# Patient Record
Sex: Female | Born: 1975
Health system: Southern US, Community
[De-identification: ages and names within clinical notes are randomized; demographics above are authoritative.]

## PROBLEM LIST (undated history)

## (undated) DIAGNOSIS — Z5189 Encounter for other specified aftercare: Secondary | ICD-10-CM

## (undated) DIAGNOSIS — K760 Fatty (change of) liver, not elsewhere classified: Secondary | ICD-10-CM

## (undated) DIAGNOSIS — M1990 Unspecified osteoarthritis, unspecified site: Secondary | ICD-10-CM

## (undated) DIAGNOSIS — E0500 Thyrotoxicosis with diffuse goiter without thyrotoxic crisis or storm: Secondary | ICD-10-CM

## (undated) DIAGNOSIS — D509 Iron deficiency anemia, unspecified: Principal | ICD-10-CM

## (undated) DIAGNOSIS — Z8619 Personal history of other infectious and parasitic diseases: Secondary | ICD-10-CM

## (undated) DIAGNOSIS — F329 Major depressive disorder, single episode, unspecified: Secondary | ICD-10-CM

## (undated) DIAGNOSIS — K219 Gastro-esophageal reflux disease without esophagitis: Secondary | ICD-10-CM

## (undated) DIAGNOSIS — I10 Essential (primary) hypertension: Secondary | ICD-10-CM

## (undated) DIAGNOSIS — I4891 Unspecified atrial fibrillation: Secondary | ICD-10-CM

## (undated) DIAGNOSIS — R011 Cardiac murmur, unspecified: Secondary | ICD-10-CM

## (undated) DIAGNOSIS — B009 Herpesviral infection, unspecified: Secondary | ICD-10-CM

## (undated) HISTORY — DX: Fatty (change of) liver, not elsewhere classified: K76.0

## (undated) HISTORY — DX: Essential (primary) hypertension: I10

## (undated) HISTORY — PX: TUBAL LIGATION: SHX77

## (undated) HISTORY — DX: Major depressive disorder, single episode, unspecified: F32.9

## (undated) HISTORY — DX: Encounter for other specified aftercare: Z51.89

## (undated) HISTORY — DX: Thyrotoxicosis with diffuse goiter without thyrotoxic crisis or storm: E05.00

## (undated) HISTORY — DX: Herpesviral infection, unspecified: B00.9

## (undated) HISTORY — DX: Gastro-esophageal reflux disease without esophagitis: K21.9

## (undated) HISTORY — DX: Unspecified atrial fibrillation: I48.91

## (undated) HISTORY — DX: Iron deficiency anemia, unspecified: D50.9

## (undated) HISTORY — DX: Personal history of other infectious and parasitic diseases: Z86.19

## (undated) HISTORY — DX: Cardiac murmur, unspecified: R01.1

---

## 1998-06-03 ENCOUNTER — Emergency Department (HOSPITAL_COMMUNITY): Admission: EM | Admit: 1998-06-03 | Discharge: 1998-06-03 | Payer: Self-pay | Admitting: Emergency Medicine

## 1998-10-17 ENCOUNTER — Inpatient Hospital Stay (HOSPITAL_COMMUNITY): Admission: AD | Admit: 1998-10-17 | Discharge: 1998-10-17 | Payer: Self-pay | Admitting: Obstetrics

## 1998-10-20 ENCOUNTER — Inpatient Hospital Stay (HOSPITAL_COMMUNITY): Admission: AD | Admit: 1998-10-20 | Discharge: 1998-10-20 | Payer: Self-pay | Admitting: Obstetrics

## 1998-11-21 ENCOUNTER — Inpatient Hospital Stay (HOSPITAL_COMMUNITY): Admission: AD | Admit: 1998-11-21 | Discharge: 1998-11-21 | Payer: Self-pay | Admitting: Obstetrics

## 1999-02-19 ENCOUNTER — Encounter (HOSPITAL_COMMUNITY): Admission: RE | Admit: 1999-02-19 | Discharge: 1999-02-24 | Payer: Self-pay | Admitting: Obstetrics

## 1999-02-20 ENCOUNTER — Inpatient Hospital Stay (HOSPITAL_COMMUNITY): Admission: AD | Admit: 1999-02-20 | Discharge: 1999-02-20 | Payer: Self-pay | Admitting: Obstetrics

## 1999-02-21 ENCOUNTER — Inpatient Hospital Stay (HOSPITAL_COMMUNITY): Admission: AD | Admit: 1999-02-21 | Discharge: 1999-02-24 | Payer: Self-pay | Admitting: Obstetrics

## 1999-02-21 ENCOUNTER — Encounter (INDEPENDENT_AMBULATORY_CARE_PROVIDER_SITE_OTHER): Payer: Self-pay

## 1999-06-10 ENCOUNTER — Emergency Department (HOSPITAL_COMMUNITY): Admission: EM | Admit: 1999-06-10 | Discharge: 1999-06-10 | Payer: Self-pay | Admitting: Emergency Medicine

## 1999-06-10 ENCOUNTER — Encounter: Payer: Self-pay | Admitting: Emergency Medicine

## 1999-11-30 ENCOUNTER — Emergency Department (HOSPITAL_COMMUNITY): Admission: EM | Admit: 1999-11-30 | Discharge: 1999-11-30 | Payer: Self-pay | Admitting: Emergency Medicine

## 2000-01-12 DIAGNOSIS — E0500 Thyrotoxicosis with diffuse goiter without thyrotoxic crisis or storm: Secondary | ICD-10-CM

## 2000-01-12 HISTORY — DX: Thyrotoxicosis with diffuse goiter without thyrotoxic crisis or storm: E05.00

## 2001-01-11 HISTORY — PX: THYROIDECTOMY: SHX17

## 2001-02-13 ENCOUNTER — Encounter: Admission: RE | Admit: 2001-02-13 | Discharge: 2001-02-13 | Payer: Self-pay

## 2001-02-15 ENCOUNTER — Encounter: Admission: RE | Admit: 2001-02-15 | Discharge: 2001-02-15 | Payer: Self-pay | Admitting: *Deleted

## 2001-03-03 ENCOUNTER — Encounter: Admission: RE | Admit: 2001-03-03 | Discharge: 2001-03-03 | Payer: Self-pay | Admitting: Internal Medicine

## 2001-03-09 ENCOUNTER — Encounter: Admission: RE | Admit: 2001-03-09 | Discharge: 2001-03-09 | Payer: Self-pay | Admitting: *Deleted

## 2001-03-09 ENCOUNTER — Ambulatory Visit (HOSPITAL_COMMUNITY): Admission: RE | Admit: 2001-03-09 | Discharge: 2001-03-09 | Payer: Self-pay | Admitting: *Deleted

## 2001-03-10 ENCOUNTER — Encounter: Admission: RE | Admit: 2001-03-10 | Discharge: 2001-03-10 | Payer: Self-pay | Admitting: *Deleted

## 2001-03-16 ENCOUNTER — Encounter: Admission: RE | Admit: 2001-03-16 | Discharge: 2001-03-16 | Payer: Self-pay | Admitting: *Deleted

## 2001-03-16 ENCOUNTER — Ambulatory Visit (HOSPITAL_COMMUNITY): Admission: RE | Admit: 2001-03-16 | Discharge: 2001-03-16 | Payer: Self-pay | Admitting: *Deleted

## 2001-03-23 ENCOUNTER — Ambulatory Visit (HOSPITAL_COMMUNITY): Admission: RE | Admit: 2001-03-23 | Discharge: 2001-03-23 | Payer: Self-pay | Admitting: *Deleted

## 2001-03-30 ENCOUNTER — Encounter: Admission: RE | Admit: 2001-03-30 | Discharge: 2001-03-30 | Payer: Self-pay | Admitting: *Deleted

## 2001-04-05 ENCOUNTER — Encounter: Admission: RE | Admit: 2001-04-05 | Discharge: 2001-04-05 | Payer: Self-pay | Admitting: *Deleted

## 2001-04-12 ENCOUNTER — Encounter: Admission: RE | Admit: 2001-04-12 | Discharge: 2001-04-12 | Payer: Self-pay | Admitting: *Deleted

## 2001-04-19 ENCOUNTER — Encounter: Admission: RE | Admit: 2001-04-19 | Discharge: 2001-04-19 | Payer: Self-pay

## 2001-05-03 ENCOUNTER — Encounter: Admission: RE | Admit: 2001-05-03 | Discharge: 2001-05-03 | Payer: Self-pay | Admitting: *Deleted

## 2001-05-25 ENCOUNTER — Encounter: Admission: RE | Admit: 2001-05-25 | Discharge: 2001-05-25 | Payer: Self-pay | Admitting: *Deleted

## 2001-06-07 ENCOUNTER — Encounter: Admission: RE | Admit: 2001-06-07 | Discharge: 2001-06-07 | Payer: Self-pay | Admitting: *Deleted

## 2001-06-14 ENCOUNTER — Ambulatory Visit (HOSPITAL_COMMUNITY): Admission: RE | Admit: 2001-06-14 | Discharge: 2001-06-14 | Payer: Self-pay | Admitting: *Deleted

## 2001-06-14 ENCOUNTER — Encounter: Payer: Self-pay | Admitting: *Deleted

## 2001-06-19 ENCOUNTER — Encounter (HOSPITAL_COMMUNITY): Admission: AD | Admit: 2001-06-19 | Discharge: 2001-07-19 | Payer: Self-pay | Admitting: *Deleted

## 2001-06-21 ENCOUNTER — Encounter: Admission: RE | Admit: 2001-06-21 | Discharge: 2001-06-21 | Payer: Self-pay | Admitting: *Deleted

## 2001-06-29 ENCOUNTER — Encounter: Admission: RE | Admit: 2001-06-29 | Discharge: 2001-06-29 | Payer: Self-pay | Admitting: *Deleted

## 2001-07-06 ENCOUNTER — Encounter: Admission: RE | Admit: 2001-07-06 | Discharge: 2001-07-06 | Payer: Self-pay | Admitting: *Deleted

## 2001-07-13 ENCOUNTER — Encounter: Admission: RE | Admit: 2001-07-13 | Discharge: 2001-07-13 | Payer: Self-pay | Admitting: *Deleted

## 2001-07-19 ENCOUNTER — Encounter: Admission: RE | Admit: 2001-07-19 | Discharge: 2001-07-19 | Payer: Self-pay | Admitting: *Deleted

## 2001-07-24 ENCOUNTER — Encounter (HOSPITAL_COMMUNITY): Admission: RE | Admit: 2001-07-24 | Discharge: 2001-08-16 | Payer: Self-pay | Admitting: Obstetrics and Gynecology

## 2001-07-26 ENCOUNTER — Encounter: Admission: RE | Admit: 2001-07-26 | Discharge: 2001-07-26 | Payer: Self-pay | Admitting: *Deleted

## 2001-07-31 ENCOUNTER — Ambulatory Visit (HOSPITAL_COMMUNITY): Admission: RE | Admit: 2001-07-31 | Discharge: 2001-07-31 | Payer: Self-pay | Admitting: *Deleted

## 2001-07-31 ENCOUNTER — Encounter: Payer: Self-pay | Admitting: *Deleted

## 2001-08-02 ENCOUNTER — Encounter: Admission: RE | Admit: 2001-08-02 | Discharge: 2001-08-02 | Payer: Self-pay | Admitting: *Deleted

## 2001-08-09 ENCOUNTER — Encounter: Admission: RE | Admit: 2001-08-09 | Discharge: 2001-08-09 | Payer: Self-pay | Admitting: *Deleted

## 2001-08-16 ENCOUNTER — Encounter: Admission: RE | Admit: 2001-08-16 | Discharge: 2001-08-16 | Payer: Self-pay | Admitting: *Deleted

## 2001-08-17 ENCOUNTER — Inpatient Hospital Stay (HOSPITAL_COMMUNITY): Admission: AD | Admit: 2001-08-17 | Discharge: 2001-08-20 | Payer: Self-pay | Admitting: *Deleted

## 2001-08-17 ENCOUNTER — Encounter (INDEPENDENT_AMBULATORY_CARE_PROVIDER_SITE_OTHER): Payer: Self-pay | Admitting: Specialist

## 2001-08-22 ENCOUNTER — Inpatient Hospital Stay (HOSPITAL_COMMUNITY): Admission: AD | Admit: 2001-08-22 | Discharge: 2001-08-22 | Payer: Self-pay | Admitting: *Deleted

## 2001-08-24 ENCOUNTER — Inpatient Hospital Stay (HOSPITAL_COMMUNITY): Admission: AD | Admit: 2001-08-24 | Discharge: 2001-08-24 | Payer: Self-pay | Admitting: *Deleted

## 2001-11-09 ENCOUNTER — Ambulatory Visit (HOSPITAL_COMMUNITY): Admission: RE | Admit: 2001-11-09 | Discharge: 2001-11-10 | Payer: Self-pay | Admitting: Surgery

## 2001-11-09 ENCOUNTER — Encounter: Payer: Self-pay | Admitting: Surgery

## 2001-11-09 ENCOUNTER — Encounter (INDEPENDENT_AMBULATORY_CARE_PROVIDER_SITE_OTHER): Payer: Self-pay | Admitting: *Deleted

## 2003-11-14 ENCOUNTER — Emergency Department (HOSPITAL_COMMUNITY): Admission: EM | Admit: 2003-11-14 | Discharge: 2003-11-14 | Payer: Self-pay | Admitting: Family Medicine

## 2005-08-13 ENCOUNTER — Inpatient Hospital Stay (HOSPITAL_COMMUNITY): Admission: AD | Admit: 2005-08-13 | Discharge: 2005-08-13 | Payer: Self-pay | Admitting: Obstetrics and Gynecology

## 2005-09-30 ENCOUNTER — Other Ambulatory Visit: Admission: RE | Admit: 2005-09-30 | Discharge: 2005-09-30 | Payer: Self-pay | Admitting: Obstetrics and Gynecology

## 2005-11-08 ENCOUNTER — Emergency Department (HOSPITAL_COMMUNITY): Admission: EM | Admit: 2005-11-08 | Discharge: 2005-11-08 | Payer: Self-pay | Admitting: Emergency Medicine

## 2005-11-10 ENCOUNTER — Emergency Department (HOSPITAL_COMMUNITY): Admission: EM | Admit: 2005-11-10 | Discharge: 2005-11-10 | Payer: Self-pay | Admitting: Family Medicine

## 2005-12-21 ENCOUNTER — Encounter (INDEPENDENT_AMBULATORY_CARE_PROVIDER_SITE_OTHER): Payer: Self-pay | Admitting: *Deleted

## 2005-12-21 ENCOUNTER — Inpatient Hospital Stay (HOSPITAL_COMMUNITY): Admission: AD | Admit: 2005-12-21 | Discharge: 2005-12-27 | Payer: Self-pay | Admitting: Obstetrics and Gynecology

## 2005-12-28 ENCOUNTER — Encounter: Admission: RE | Admit: 2005-12-28 | Discharge: 2006-01-27 | Payer: Self-pay | Admitting: Obstetrics and Gynecology

## 2006-01-28 ENCOUNTER — Encounter: Admission: RE | Admit: 2006-01-28 | Discharge: 2006-02-27 | Payer: Self-pay | Admitting: Obstetrics and Gynecology

## 2006-02-28 ENCOUNTER — Encounter: Admission: RE | Admit: 2006-02-28 | Discharge: 2006-03-28 | Payer: Self-pay | Admitting: Obstetrics and Gynecology

## 2006-03-29 ENCOUNTER — Encounter: Admission: RE | Admit: 2006-03-29 | Discharge: 2006-04-28 | Payer: Self-pay | Admitting: Obstetrics and Gynecology

## 2006-04-29 ENCOUNTER — Encounter: Admission: RE | Admit: 2006-04-29 | Discharge: 2006-05-28 | Payer: Self-pay | Admitting: Obstetrics and Gynecology

## 2006-05-29 ENCOUNTER — Encounter: Admission: RE | Admit: 2006-05-29 | Discharge: 2006-06-14 | Payer: Self-pay | Admitting: Obstetrics and Gynecology

## 2006-11-14 ENCOUNTER — Ambulatory Visit (HOSPITAL_COMMUNITY): Admission: RE | Admit: 2006-11-14 | Discharge: 2006-11-14 | Payer: Self-pay | Admitting: Internal Medicine

## 2007-10-26 ENCOUNTER — Encounter: Admission: RE | Admit: 2007-10-26 | Discharge: 2007-10-26 | Payer: Self-pay | Admitting: Family Medicine

## 2008-04-19 DIAGNOSIS — E039 Hypothyroidism, unspecified: Secondary | ICD-10-CM | POA: Insufficient documentation

## 2008-04-19 DIAGNOSIS — I059 Rheumatic mitral valve disease, unspecified: Secondary | ICD-10-CM

## 2008-04-19 DIAGNOSIS — K560 Paralytic ileus: Secondary | ICD-10-CM | POA: Insufficient documentation

## 2008-04-19 DIAGNOSIS — K5900 Constipation, unspecified: Secondary | ICD-10-CM | POA: Insufficient documentation

## 2008-04-19 DIAGNOSIS — R12 Heartburn: Secondary | ICD-10-CM | POA: Insufficient documentation

## 2008-04-24 ENCOUNTER — Ambulatory Visit: Payer: Self-pay | Admitting: Internal Medicine

## 2008-04-24 DIAGNOSIS — R079 Chest pain, unspecified: Secondary | ICD-10-CM

## 2008-04-24 DIAGNOSIS — F329 Major depressive disorder, single episode, unspecified: Secondary | ICD-10-CM

## 2008-05-07 ENCOUNTER — Ambulatory Visit: Payer: Self-pay | Admitting: Internal Medicine

## 2008-05-07 ENCOUNTER — Encounter: Payer: Self-pay | Admitting: Internal Medicine

## 2008-05-10 ENCOUNTER — Encounter: Payer: Self-pay | Admitting: Internal Medicine

## 2009-06-11 LAB — HM PAP SMEAR: HM Pap smear: NORMAL

## 2010-01-11 DIAGNOSIS — F32A Depression, unspecified: Secondary | ICD-10-CM

## 2010-01-11 HISTORY — DX: Depression, unspecified: F32.A

## 2010-05-29 NOTE — H&P (Signed)
Cheyenne Hawkins, Cheyenne Hawkins             ACCOUNT NO.:  [id number]   MEDICAL RECORD NO.:  [id number]          PATIENT TYPE:  INP   LOCATION:  NA                            FACILITY:  [facility]   PHYSICIAN:  Janine Limbo, M.D.DATE OF BIRTH:  Jul 09, 1975   DATE OF ADMISSION:  12/21/2005  DATE OF DISCHARGE:                              HISTORY & PHYSICAL   HISTORY OF PRESENT ILLNESS:  Cheyenne Hawkins is a 35 year old female,  gravida 4, para 2-0-1-2, who presents at [redacted] weeks gestation (EDC is  December 29, 2005) for repeat cesarean section and tubal ligation.  The  patient has been followed at the [facility] and  Gynecology Division of [facility].  The patient's  pregnancy has been complicated by the fact that she has had two prior  cesarean sections.  She also has a history of hypothyroidism.  The  patient has done well during this pregnancy.   ALLERGIES:  NO KNOWN DRUG ALLERGIES.   OBSTETRICAL HISTORY:  1. In 1995, the patient had a first trimester elective pregnancy      termination.  2. In 2001, the patient had a cesarean section at term where she      delivered an 8 pound 2 ounce female infant.  3. In 2003, the patient had a repeat cesarean section at term where      she delivered a 7 pound 10 ounce female infant.  That pregnancy was      complicated by uterine rupture.   PAST MEDICAL HISTORY:  1. History of hypothyroidism and she currently takes Synthroid.  2. History of mitral valve prolapse.   SOCIAL HISTORY:  The patient is an [employer] fund[employer] and she is married.  She denies  cigarette use, alcohol use and recreational drug use during her  pregnancy.  She does drink wine when she is not pregnant.   REVIEW OF SYSTEMS:  Noncontributory.   FAMILY HISTORY:  Noncontributory.   PHYSICAL EXAMINATION:  VITAL SIGNS:  Weight 171 pounds.  HEENT:  Within normal limits.  CHEST:  Clear.  CARDIOVASCULAR:  Regular rate and rhythm.  BREASTS:  Without masses.  ABDOMEN:   Gravid with a fundal height of 36 cm.  EXTREMITIES:  Grossly normal.  NEUROLOGIC:  Grossly normal.  PELVIC:  Cervix was closed and long when last checked.   Blood type is O positive, antibody screen negative.  Sickle cell  negative.  Rubella positive.  HBSAG negative.  Third trimester Beta  Strep is negative.   ASSESSMENT:  1. At [redacted] weeks gestation.  2. Two prior cesarean sections.  3. Desires repeat cesarean section and tubal sterilization.  4. Hypothyroidism.  5. Mitral valve prolapse.   PLAN:  The patient will undergo repeat cesarean section and bilateral  tubal ligation.  The patient understands the indications for her  surgical procedure and she accepts the risk of, but not limited to  anesthetic complications, bleeding, infections, and possible damage to  the surrounding organs.  She also understands that there is a small but  real failure rate associated with tubal ligation (17/1000).  Janine Limbo, M.D.  Electronically Signed     AVS/MEDQ  D:  12/20/2005  T:  12/20/2005  Job:  [id number]

## 2010-05-29 NOTE — H&P (Signed)
[facility]  Patient:    Cheyenne Hawkins, Cheyenne Hawkins                        MRN: [id number] Adm. Date:  [id number] Attending:  Venita Sheffield                         History and Physical  HISTORY OF PRESENT ILLNESS:   The patient is a 35 year old, gravida 2, para 0, whose admitted in active labor.  The patients estimated date of confinement was  February 12, 1999.  At the time of admission, the patient did indicate that she ad had a past history of a partner with HSG, however, on evaluation of the pelvic genitalia and vaginal and cervical areas, no active lesions were present.  The patient was allowed to proceed in labor to which she proceeded in adequate labor for approximately six hours during which time there was no descent and the infant remained at a -2 to -3 station.  After membranes were ruptured, slight meconium  stained fluid was present, however, over the next three to four hours, no descent was apparent, and the cervical dilatation remained at approximately 6 cm.  The decision then was to proceed with a cesarean section.  PHYSICAL EXAMINATION:  GENERAL:                      Revealed a well-developed, well-nourished, gravid  female in labor-type distress.  HEENT:                        Within normal limits.  NECK:                         Supple.  BREASTS:                      Without masses, tenderness, or discharge.  LUNGS:                        Clear to auscultation and percussion.  HEART:                        Normal sinus rhythm without murmurs, rubs, or gallops.  ABDOMEN:                      Term gravid with fetal heart beats in the left lower quadrant.  EXTREMITIES: NEUROLOGICAL:    Within normal limits.  PELVIC:                       Revealed a cervix of 6 cm dilation at -2 station,  vertex presentation.  ADMISSION DIAGNOSES:          1. Intrauterine pregnancy.                               2. Failure to  progress.  PLAN:                         Primary cesarean section. DD:  02/21/99 TD:  02/23/99 Job: [id number]

## 2010-05-29 NOTE — Op Note (Signed)
Cheyenne Hawkins, [name]                           ACCOUNT NO.:  [id number]   MEDICAL RECORD NO.:  [id number]                   PATIENT TYPE:  INP   LOCATION:  9107                                 FACILITY:  [facility]   PHYSICIAN:  Enid Cutter, M.D.                  DATE OF BIRTH:  12-May-1975   DATE OF PROCEDURE:  08/17/2001  DATE OF DISCHARGE:                                 OPERATIVE REPORT   PREOPERATIVE DIAGNOSIS:  A 35 year old gravida 2, para 1, with attempted  vaginal birth after cesarean delivery, with nonreassuring fetal heart  tracing.   POSTOPERATIVE DIAGNOSES:  39. A 35 year old gravida 2, para 1, with attempted vaginal birth after     cesarean delivery, with nonreassuring fetal heart tracing.  2. Uterine rupture.   PROCEDURE:  Repeat low transverse cesarean section.   SURGEON:  Enid Cutter, M.D.   ASSISTANT:  Cheyenne Hawkins, C.N.M.   ANESTHESIA:  Spinal.   COMPLICATIONS:  None.   SPECIMENS:  Placenta.   ESTIMATED BLOOD LOSS:  800 cc.   DISPOSITION:  To recovery room stable.   INDICATIONS:  The patient is a 35 year old gravida 2, para 1, who presented  to labor and delivery for attempted VBAC.  She underwent artificial rupture  of membranes and spontaneous labor.  She progressed to approximately 4 cm.  I was called in emergently to the patient's room secondary to a  nonreassuring fetal heart tracing and because she was complaining of severe  lower abdominal pain and began having severe repetitive variable  decelerations.  The patient was counseled on risks of C-section, including  the risks of bleeding, infection, and injury to internal organs, and agreed  to proceed with cesarean.   DESCRIPTION OF PROCEDURE:  The patient was taken back to the operating room  and was given spinal anesthesia.  She was prepped and draped in a sterile  fashion.  A Pfannenstiel incision was performed with a scalpel and carried  down to the underlying fascia.  The fascia was then  incised sharply and  dissected laterally with Mayo scissors.  The fascia was separated from the  underlying rectus muscle bellies with sharp and blunt dissection.  The  rectus muscle bellies were separated and the peritoneum was entered sharply  with Metzenbaum scissors.  A bladder flap was created and the uterus was  scored with the scalpel.  The uterine incision was extended in the midline  with scalpel and the surgeon's fingers were used to extend the incision.  The infant's head was delivered and bulb-suctioned on the operative field.  The infant's body was delivered and the cord was clamped and cut.  The  infant was handed off to the awaiting neonatal resuscitation team.  Apgars  were 8 and 9.  Once the infant was delivered, the placenta was then manually  extracted and the uterus secured with a dry lap sponge  and then  externalized.  A survey of the anatomy revealed that there was a uterine  rupture and marked edema of the broad ligaments bilaterally.  The bladder  was overlying a very thin lower uterine segment.  The bladder was carefully  dissected away from the lower uterine segment and the uterine repair was  performed with a running locking stitch of 0 chromic.  A second imbricating  layer of sutures was performed and final hemostasis was obtained with  multiple figure-of-eight sutures.  The uterus was returned to the abdomen,  the paracolic gutters were emptied of clot and debris.  The bladder was then  infused with sterile milk to assure no bladder injury.  The bladder was  distended, and there was no leaking.  The fascia was then closed with a  running stitch of 0 Vicryl.  The subcutaneous tissues were irrigated with  copious amounts of saline and the skin was reapproximated with staples.  The  patient tolerated the procedure well and returned to the recovery room in  stable condition.  The sponge, instrument, and needle counts were correct at  the end of the  procedure.                                               Enid Cutter, M.D.    EMH/MEDQ  D:  08/17/2001  T:  08/19/2001  Job:  [id number]

## 2010-05-29 NOTE — Op Note (Signed)
[facility]  Patient:    Cheyenne Hawkins, Cheyenne Hawkins                        MRN: [id number] Proc. Date: 02/21/99 Adm. Date:  [id number] Attending:  Venita Sheffield                           Operative Report  PREOPERATIVE DIAGNOSIS:       Intrauterine pregnancy at term with failure to progress.  POSTOPERATIVE DIAGNOSIS:      Intrauterine pregnancy at term with failure to progress.  Plus a viable female infant with Apgars of 6 and 9.  OPERATION:                    Primary low transverse cesarean section.  SURGEON:                      Deniece Ree, M.D.  ASSISTANT:  ANESTHESIA:                   Epidural by Gretta Cool., M.D.  PEDIATRICS:                   Teaching Service.  ESTIMATED BLOOD LOSS:         350 cc.  DRAINS:                       Foley was left to straight drainage.  CONDITION:                    The patient tolerated the procedure well and returned to the recovery room in satisfactory condition.  DESCRIPTION OF PROCEDURE:     The patient was taken to the operating room and prepped and draped in the usual fashion for a cesarean section.  A low Pfannenstiel incision was made.  This was carried down to the fascia at which time the fascia was entered and excised the extent of the incision.  The midline was identified and rectus muscles separated.  The abdominal peritoneum was then entered in a vertical fashion using Metzenbaum scissors.  The visceroperitoneum was then excised bilaterally toward the round ligaments following which the lower uterine segment was scored, entered in the midline, and bluntly dissected open.  The right hand  introduced and the infants head was delivered.  There was a cord around the neck x 1 which was removed.  The infant was DeLeed following which suction bulb was utilized to suck infant out.  Complete delivery was then carried out without any problems.  The cord was clamped and the infant  turned over to the pediatricians who were in attendance.  Cord blood was then obtained following which the placenta s well as all products of conception were then manually removed from the uterine cavity.  IV Pitocin as well as IV antibiotics were then begun.  The myometrium as then closed using #1 chromic in a running locking stitch followed by an imbricating stitch again using #1 chromic.  Reperitonealization was then carried out using -0 chromic in a running stitch.  Sponge, needle, and instrument counts were correct x 2.  Hemostasis was present.  Abdominal peritoneum was then closed using 2-0 chromic in a running stitch followed by closure of the fascia using #1 Vicryl in a running stitch.  The skin was closed with skin staples.  The procedure was terminated. The patient tolerated the procedure well and returned to the recovery room in satisfactory condition. DD:  02/21/99 TD:  02/23/99 Job: [id number]

## 2010-05-29 NOTE — Discharge Summary (Signed)
Cheyenne Hawkins, Cheyenne Hawkins             ACCOUNT NO.:  [id number]   MEDICAL RECORD NO.:  [id number]          PATIENT TYPE:  INP   LOCATION:  9122                          FACILITY:  [facility]   PHYSICIAN:  Cheyenne Hawkins, M.D. DATE OF BIRTH:  [birth year]   DATE OF ADMISSION:  12/21/2005  DATE OF DISCHARGE:                               DISCHARGE SUMMARY   ADMITTING DIAGNOSES:  1. Intrauterine pregnancy at 39 weeks.  2. Previous cesarean section.  3. Desires repeat cesarean section and tubal sterilization.  4. Hypothyroidism.  5. Mitral valve prolapse.   DISCHARGE DIAGNOSES:  1. Intrauterine pregnancy at 39 weeks.  2. Previous cesarean section.  3. Desires repeat cesarean section and tubal sterilization.  4. Hypothyroidism.  5. Mitral valve prolapse.  6. Ileus.  7. Hypotensive reaction of uncertain etiology.  8. Anemia postoperatively.   PROCEDURES:  1. Repeat low transverse cesarean section.  2. Bilateral tubal sterilization.  3. Spinal anesthesia.  4. Blood transfusion.   HOSPITAL COURSE:  Cheyenne Hawkins is a 35 year old gravida 4, para 2-0-1-2  who presented at [redacted] weeks gestation on December 21, 2005, for repeat  scheduled cesarean section and tubal ligation.  Her pregnancy had been  remarkable for:  1. Two previous cesarean sections.  2. History of hypothyroidism.  3. History of mitral valve prolapse.   The patient was taken to the operating room where a repeat low  transverse cesarean section was performed by Cheyenne Hawkins for a viable  female by the name of Cheyenne Hawkins, weight 7 pounds 8 ounces, Apgars were 9 and  9.  This was done under spinal anesthesia.  Tubes were ligated.  Later  that afternoon, Cheyenne Hawkins and Cheyenne Hawkins saw the patient on an  emergent basis secondary to an episode of questionable seizure-like  activity and more likely a hypotensive episode of uncertain etiology.  Several liters of IV fluid were infused.  There was a small amount of  visible fluid in the  abdomen was bedside ultrasound.  The patient did  not appear postictal at that time.  Blood pressure was 100/50 after 2.5  L of fluid.  O2 saturation remained 100%.  Hemoglobin at that time was  8.1, which was down from 11.1; potassium was 3.2; white blood cell count  was 11,900; platelets were 236,000.  TSH was 0.458 preoperatively.  SGOT  and SGPT were within normal limits.  CBC was done at approximately 6:45  p.m. and this was noted to be 6.7.  The patient was advised of the risks  and benefits of transfusion and she was transfused with 2 units of  packed red cells.  Her urine output was also diminished.   By postoperative day #1 she was status post 2 units of packed red cells,  urine output was improving, O2 saturation was within normal limits.  Hemoglobin was 8.4; white blood cell count was 12,100; platelets were  146,000.  PT, PTT and INR were normal.  Chest was clear.  Dressing was  clean, dry, and intact.  Abdomen was distended with hyperactive bowel  sounds.  Specific gravity of her urine was greater  than 1.030.  A repeat  CBC was planned.  The patient's fluids were increased and was encouraged  to ambulate.  She was doing well without difficulty.  She did begin to  diurese subsequently later that day.  By postoperative day #2 the  patient was beginning to complain more of abdominal gaseous distention.  There was no rebound, it was appropriately tender.  Her incision was  clean, dry, and intact.  Vital signs were stable.  She had a small BM  with a suppository but no flatus since that time.  She had a temperature  maximum of 100.3.  No orthostatic changes.  Dulcolax suppository was  given and repeat CBC was done showing a hemoglobin of 7.6.  By  postoperative day #3, the patient was continued to have some abdominal  distention.  There were some bowel sounds noted.  There was no guarding  and no rebound tenderness.  Abdominal x-ray was done, however, that was  consistent with an  ileus.  A soapsuds enema was done with essentially no  benefit.  She was placed on Reglan for bowel stimulation and was  maintained n.p.o.  By postoperative day #4, she had had some flatus but  no bowel movement.  Her weight was stable at 175.6.  Her hemoglobin was  8.1 from December 14.  Flatus did begin to increase.  However, on  December 15, postoperative day #4, she did spike a temperature of 102.  Unasyn was begun.  White blood cell count was 5.9.  Soapsuds enema was  repeated.  She had several large bowel movements subsequent to this.  By  December 16 she was feeling better subsequent to the relief from the  soapsuds enema.  Diet was beginning to be advanced.  She was passing  flatus.  She was having no nausea or vomiting.  Unasyn was continued for  24 hours.  Blood culture had been done when her temperature spiked and  blood cultures by December 17 were negative on preliminary report.   By December 27, 2005, on postoperative day #6, the patient was doing  well.  She was up ad lib, she had been afebrile greater than 24 hours.  Her abdomen still showed some upper abdominal tympany with gaseous  distention but there were positive bowel sounds noted and the patient  was passing flatus.  She was also having small amounts of bowel  movement.  The abdomen did have a small area of edema in the lower  dependent area which represented edema of the skin.  There was no  evidence of cellulitis and this area was nontender.  Her incision was  clean, dry, and intact with subcuticular sutures noted.  Cheyenne Hawkins was  consulted and she did see the patient.  The decision was made to  discharge the patient home in good condition.   DISCHARGE INSTRUCTIONS:  The patient is to monitor her temperature twice  a day.  She is to notify us with any fever greater than or equal to  100.4.  She is also to maintain bowel hygiene and to push fluids.  Other instructions are per [facility] handout.    DISCHARGE MEDICATIONS:  1. Motrin 600 mg p.o. q.6h. p.r.n. pain.  2. Tylox one to two p.o. q.3-4h. p.r.n. pain.  3. The patient may use Gas-X, Phazyme, and over-the-counter stool      softeners as needed.  4. She may also use a Dulcolax suppository as needed for any  constipation.   DISCHARGE FOLLOWUP:  Will occur on December 31, 2005, at 9:45 a.m. for  incision check, then a 6-week postpartum visit will be arranged.  The  patient is to notify us with any fever or any other issues.      Renaldo Reel Emilee Hero, C.N.M.      Cheyenne Fat Hawkins, M.D.  Electronically Signed    VLL/MEDQ  D:  12/27/2005  T:  12/27/2005  Job:  [id number]

## 2010-05-29 NOTE — Op Note (Signed)
Cheyenne Hawkins, Cheyenne Hawkins             ACCOUNT NO.:  [id number]   MEDICAL RECORD NO.:  [id number]          PATIENT TYPE:  INP   LOCATION:  9123                          FACILITY:  [facility]   PHYSICIAN:  Janine Limbo, M.D.DATE OF BIRTH:  Sep 21, 1975   DATE OF PROCEDURE:  12/21/2005  DATE OF DISCHARGE:                               OPERATIVE REPORT   PREOPERATIVE DIAGNOSES:  1. Two prior cesarean sections.  2. Desires sterilization.   POSTOPERATIVE DIAGNOSES:  1. Two prior cesarean sections.  2. Desires sterilization.   PROCEDURE:  Repeat low transverse cesarean section and bilateral tubal  ligation.   SURGEON:  Dr. Leonard Schwartz.   FIRST ASSISTANT:  Rica Koyanagi, C.N.M.   ANESTHETIC:  Spinal.   DISPOSITION:  Cheyenne Hawkins is a 35 year old female, gravida 4, para 2-0-  1-2, who presents at 103 weeks gestation (EDC December19,2007) for  repeat cesarean section and tubal ligation.  The patient has had two  prior cesarean sections.  She has also had a uterine rupture in the  past.  The patient understands the indications for her surgical  procedure and she accepts the risks of, but not limited to, anesthetic  complications, bleeding, infection, possible damage to surrounding  organs, and possible tubal failure (17 per 1000).   FINDINGS:  A 7 pound 8 ounce female infant Cheyenne Hawkins) was delivered from a  cephalic presentation.  The Apgars were 9 at one minute and 9 at five  minutes.  The uterus, fallopian tubes, and ovaries were normal for the  gravid state.   PROCEDURE:  The patient was taken to the operating room where a spinal  anesthetic was given.  The patient's abdomen, perineum, and vagina were  prepped with multiple layers of Betadine.  A Foley catheter was placed  in the bladder.  The patient was then sterilely draped.  The lower  abdomen was injected with 10 mL of half percent Marcaine with  epinephrine.  A low transverse incision was made in the abdomen and  carried sharply through the subcutaneous tissue, the fascia, and the  anterior peritoneum.  There was some scarring at the bladder flap and  the scars were removed.  Care was taken not to damage the bladder.  An  incision was made in the lower uterine segment and extended in a low  transverse fashion.  The fetal head was delivered without difficulty.  The mouth and nose were suctioned.  The remainder of the infant was then  delivered.  The cord was clamped and cut and the infant was handed to  the waiting pediatric team.  Routine cord blood studies were obtained.  The placenta was removed.  The uterine cavity was cleaned of amniotic  fluid, clotted blood, and membranes.  The uterine incision was closed  using a running locking suture of 2-0 Vicryl followed by an imbricating  suture of 2-0 Vicryl.  Figure-of-eight sutures of 0 Vicryl were used for  hemostasis.  Hemostasis was noted to be adequate.  The left fallopian  tube was identified and followed to its fimbriated end.  A knuckle of  tube was  made on the left using a suture ligature and a free tie of 0  plain catgut.  The knuckle of tube thus made was excised.  Hemostasis  was adequate.  An identical procedure was carried out on the opposite  side.  Again hemostasis was adequate.  The pelvis was vigorously  irrigated.  Hemostasis was confirmed.  The anterior peritoneum and  abdominal musculature were reapproximated in the midline using 0 Vicryl.  The fascia was closed using a running suture of 0 Vicryl followed by  three interrupted sutures of 0 Vicryl.  The subcutaneous tissue was  irrigated.  Hemostasis was achieved using the Bovie cautery.  The skin  was closed using a subcuticular suture of 3-0 Monocryl.  Sponge, needle,  instrument counts were correct on two occasions.  The estimated blood  loss was 800 mL.  The patient tolerated her procedure well.  She was  taken to the recovery room in stable condition.  The patient was noted   to drain clear yellow urine at the end of her procedure.  The cut  portions of the fallopian tubes were sent to pathology.  The placenta  was sent to pathology.      Janine Limbo, M.D.  Electronically Signed     AVS/MEDQ  D:  12/21/2005  T:  12/21/2005  Job:  [id number]

## 2010-05-29 NOTE — Discharge Summary (Signed)
[name], Cheyenne Hawkins                           ACCOUNT NO.:  [id number]   MEDICAL RECORD NO.:  [id number]                   PATIENT TYPE:  MAT   LOCATION:  MATC                                 FACILITY:  [facility]   PHYSICIAN:  Maylon Peppers. Waynette Buttery, M.D.               DATE OF BIRTH:  12/16/1975   DATE OF ADMISSION:  08/22/2001  DATE OF DISCHARGE:                                 DISCHARGE SUMMARY   DISCHARGE DIAGNOSES:  1. Status post emergent cesarean section.  2. Hypothyroidism.   DISCHARGE MEDICATIONS:  1. Percocet 5/325 one p.o. q.4h. p.r.n. pain.  2. Motrin 600 mg one p.o. q.6h. p.r.n. pain.  3. Micronor as directed.  4. Ferrous sulfate 325 mg p.o. q.d.  5. PTU 250 mg t.i.d.  6. Inderal 40 mg b.i.d.   DISCHARGE INSTRUCTIONS:  She is discharged with instructions for pelvic  rest.  She was discharged with a follow-up appointment at [facility] in  six weeks and instructed to return to MAU Tuesday or Wednesday of this  coming week for staple removal and wound check.   HOSPITAL COURSE:  The patient is a 35 year old African-American female G2,  P1-0-0-1 who presented at 40 weeks.  The patient was admitted for induction  of labor for VBAC.  At the time of admission she was contracting every four  to six minutes with a reactive strip with variable decelerations.  Her  examination was 2, 80%, vertex, -2 with a bulging bag of water.  See  admission H&P for further details.  The patient had artificial rupture of  membranes at 2:30 p.m. on August 17, 2001 which showed clear fluid, but slow  leakage.  An IUPC was placed.  At approximately 5 p.m. on August 7 the  patient underwent emergent cesarean section for nonreassuring fetal heart  tones.  She had a low transverse repeat cesarean section.  The infant was  vertex.  Placenta was manually removed.  Infant was female with Apgars of 8  and 9.  The patient also had a ruptured uterus and therefore should never be  allowed to labor again.   Hyperthyroidism.  The patient is to follow up with Dr. Evlyn Kanner who is  presently on vacation.  She should call his office on Monday for an  appointment within the next week or two.   SUGGESTED FOLLOWUP ITEMS:  1. The patient is to return to MAU on Tuesday or Wednesday for wound check     and staple removal as on the last day of admission there was some oozing     from the right edge and just left of center on her incision.  2. The patient needs to follow up with Dr. Evlyn Kanner in one to two weeks for     adjustments in her hyperthyroidism medications.  3. The patient should never have another trial of labor as her uterus has  ruptured.                                               Maylon Peppers Waynette Buttery, M.D.   SAG/MEDQ  D:  08/20/2001  T:  08/23/2001  Job:  [id number]

## 2010-05-29 NOTE — Op Note (Signed)
Cheyenne Hawkins, TORO                           ACCOUNT NO.:  [id number]   MEDICAL RECORD NO.:  [id number]                   PATIENT TYPE:  OIB   LOCATION:  2550                                 FACILITY:  [facility]   PHYSICIAN:  Velora Heckler, M.D.                DATE OF BIRTH:  [birth year]   DATE OF PROCEDURE:  11/09/2001  DATE OF DISCHARGE:                                 OPERATIVE REPORT   PREOPERATIVE DIAGNOSIS:  Graves' disease, toxic goiter.   POSTOPERATIVE DIAGNOSES:  Graves' disease, toxic goiter.   PROCEDURE:  Total thyroidectomy.   SURGEON:  Velora Heckler, M.D.   ASSISTANT:  Gabrielle Dare. Janee Morn, M.D.   ANESTHESIA:  General.   ESTIMATED BLOOD LOSS:  Less than 100 cc.   PREPARATION:  Betadine.   COMPLICATIONS:  None.   INDICATIONS FOR PROCEDURE:  The patient is a 35 year old black female seen  at the request of Dr. Ardyth Harps for Graves' disease.  The patient  developed signs and symptoms of Graves' disease during the first trimester  of her recent pregnancy.  She developed a significant goiter.  She was  started on propylthiouracil during the second trimester and a beta blocker  in the third trimester.  The patient now comes to surgery for thyroidectomy  for treatment of Graves' disease and a toxic goiter.   PROCEDURE IN DETAIL:  The procedure was done in OR #17 at the [facility].  The patient was brought to the operating room and placed  in a supine position on the operating room table.  Following the  administration of general anesthesia, the patient was prepped and draped in  the usual strict aseptic fashion.  After ascertaining that an adequate level  of anesthesia had been obtained, a Kocher incision was made with a #10  blade.  Dissection was carried down through the subcutaneous tissues and  platysma.  Skin flaps were developed cephalad and caudad from the thyroid  notch to the sternal notch.  A Horner self-retaining retractor was  placed  for exposure.  The strap muscles were incised in the midline.  Dissection  was begun on the left side.  The left lobe was markedly enlarged.  The strap  muscles were reflected laterally.  By blunt dissection, the left thyroid  lobe was defined.  Venous tributaries were divided between medium Liga  clips.  The superior pole was carefully dissected out.  The superior pole  vessels were ligated in continuity with 2-0 silk ties and medium Liga clips  and divided.  The superior pole was mobilized anteriorly and medially.  Branches of the inferior thyroid artery were divided between small Liga  clips.  The recurrent laryngeal nerve was identified and preserved.  The  parathyroid glands were identified and preserved.  Dissection was carried  down to the Zuckerkandl's tubercle and the ligament of Berry.  This was  carefully dissected out and transected with the electrocautery, taking care  to avoid injury to the recurrent nerve.  The gland was mobilized up and onto  the anterior surface of the trachea.  Next, we turned our attention to the  right side.  The right side was approximately twice as large as the left.  It extends well cephalad and well posterior to the hypopharynx.  Careful  dissection includes division of inferior venous tributaries between 2-0 silk  ligatures.  The gland was rolled anteriorly.  The superior pole vessels were  ligated in continuity with 2-0 silk ties and medium Liga clips and divided.  The superior pole was gently mobilized from behind the hypopharynx.  Care  was taken to preserve vascular structures.  The gland was adherent to the  lateral wall of the esophagus, which was gently dissected off with blunt  dissection.  The gland was rolled anteriorly.  The branches of the inferior  thyroid artery were divided between small Liga clips.  The recurrent  laryngeal nerve was identified and preserved.  Both the superior and  inferior parathyroid glands were identified  and preserved.  The gland was  rolled up anteriorly until the ligament of Allyson Sabal could be divided easily  with the electrocautery without endangering the recurrent nerve.  The gland  was mobilized off the anterior surface of the trachea.  All that remains is  a small pyramidal lobe, which is carefully dissected out with the  electrocautery and excised in toto with the total thyroid specimen.  Sutures  used to mark the right superior pole and the specimen is submitted to  pathology for review.  The neck was irrigated copiously with warm saline.  Good hemostasis was noted.  Surgicel was placed over the area of the  recurrent laryngeal nerves bilaterally.  The strap muscles were  reapproximated in the midline with interrupted 3-0 Vicryl sutures.  The  platysma was reapproximated with interrupted 3-0 Vicryl sutures.  The skin  edges are closed with widely spaced stainless steel staples and interspaced  half-inch Steri-Strips and Benzoin.  Sterile gauze dressings are placed.  The patient is awakened from anesthesia and brought to the recovery room in  stable condition.  The patient tolerated the procedure well.                                               Velora Heckler, M.D.    TMG/MEDQ  D:  11/09/2001  T:  11/09/2001  Job:  [id number]   cc:   Jeannett Senior A. Evlyn Kanner, M.D.  [address]  [place]  [place] [address]  Fax: [phone]

## 2010-08-17 ENCOUNTER — Ambulatory Visit (INDEPENDENT_AMBULATORY_CARE_PROVIDER_SITE_OTHER): Payer: 59 | Admitting: Family

## 2010-08-17 ENCOUNTER — Encounter: Payer: Self-pay | Admitting: Family

## 2010-08-17 DIAGNOSIS — F341 Dysthymic disorder: Secondary | ICD-10-CM

## 2010-08-17 DIAGNOSIS — F418 Other specified anxiety disorders: Secondary | ICD-10-CM

## 2010-08-17 DIAGNOSIS — E890 Postprocedural hypothyroidism: Secondary | ICD-10-CM

## 2010-08-17 DIAGNOSIS — K589 Irritable bowel syndrome without diarrhea: Secondary | ICD-10-CM

## 2010-08-17 DIAGNOSIS — K219 Gastro-esophageal reflux disease without esophagitis: Secondary | ICD-10-CM

## 2010-08-17 MED ORDER — VENLAFAXINE HCL ER 75 MG PO CP24: 75.0000 mg | ORAL_CAPSULE | Freq: Every day | ORAL | Status: DC

## 2010-08-17 MED ORDER — SERTRALINE HCL 100 MG PO TABS: 100.0000 mg | ORAL_TABLET | Freq: Every day | ORAL | Status: DC

## 2010-08-17 MED ORDER — OMEPRAZOLE 20 MG PO CPDR: 20.0000 mg | DELAYED_RELEASE_CAPSULE | Freq: Every day | ORAL | Status: DC
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
## 2010-08-17 NOTE — Patient Instructions (Signed)
Please follow up in 1 month, sooner if problems or concerns. Welcome to [facility]!

## 2010-08-18 ENCOUNTER — Telehealth: Payer: Self-pay | Admitting: *Deleted

## 2010-08-18 DIAGNOSIS — E890 Postprocedural hypothyroidism: Secondary | ICD-10-CM | POA: Insufficient documentation

## 2010-08-18 DIAGNOSIS — F418 Other specified anxiety disorders: Secondary | ICD-10-CM | POA: Insufficient documentation

## 2010-08-18 DIAGNOSIS — K589 Irritable bowel syndrome without diarrhea: Secondary | ICD-10-CM | POA: Insufficient documentation

## 2010-08-18 DIAGNOSIS — K219 Gastro-esophageal reflux disease without esophagitis: Secondary | ICD-10-CM | POA: Insufficient documentation
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
## 2010-08-18 NOTE — Assessment & Plan Note (Signed)
This is followed by endocrinology and she reports that it has been stable.

## 2010-08-18 NOTE — Assessment & Plan Note (Signed)
Suspect that her IBS has been flaring up due to stress.  She will let me know if symptoms worsen at which point we will consider referral back to GI for further evalution.

## 2010-08-18 NOTE — Assessment & Plan Note (Signed)
Trial of PPI to see if this helps with her nausea.  If no improvement will consider abdominal ultrasound/LFT's to evaluate for cholecystitis.

## 2010-08-18 NOTE — Telephone Encounter (Signed)
Records have been received from [facility] in [place] and forwarded to Provider for review.

## 2010-09-18 ENCOUNTER — Encounter: Payer: Self-pay | Admitting: Family

## 2010-09-18 ENCOUNTER — Ambulatory Visit (INDEPENDENT_AMBULATORY_CARE_PROVIDER_SITE_OTHER): Payer: 59 | Admitting: Family

## 2010-09-18 ENCOUNTER — Other Ambulatory Visit: Payer: Self-pay | Admitting: Family

## 2010-09-18 DIAGNOSIS — F418 Other specified anxiety disorders: Secondary | ICD-10-CM

## 2010-09-18 DIAGNOSIS — L299 Pruritus, unspecified: Secondary | ICD-10-CM | POA: Insufficient documentation

## 2010-09-18 DIAGNOSIS — F341 Dysthymic disorder: Secondary | ICD-10-CM

## 2010-09-18 DIAGNOSIS — R350 Frequency of micturition: Secondary | ICD-10-CM

## 2010-09-18 DIAGNOSIS — E039 Hypothyroidism, unspecified: Secondary | ICD-10-CM

## 2010-09-18 DIAGNOSIS — Z862 Personal history of diseases of the blood and blood-forming organs and certain disorders involving the immune mechanism: Secondary | ICD-10-CM

## 2010-09-18 LAB — BASIC METABOLIC PANEL
Chloride: 102 mEq/L (ref 96–112)
Glucose, Bld: 56 mg/dL — ABNORMAL LOW (ref 70–99)

## 2010-09-18 LAB — TSH: TSH: 5.716 u[IU]/mL — ABNORMAL HIGH (ref 0.350–4.500)

## 2010-09-18 LAB — CBC WITH DIFFERENTIAL/PLATELET
Eosinophils Absolute: 0.1 10*3/uL (ref 0.0–0.7)
Lymphocytes Relative: 31 % (ref 12–46)
Monocytes Absolute: 0.7 10*3/uL (ref 0.1–1.0)
Neutrophils Relative %: 60 % (ref 43–77)

## 2010-09-18 NOTE — Assessment & Plan Note (Signed)
Will check CBC 

## 2010-09-18 NOTE — Assessment & Plan Note (Signed)
Improved.  She wishes to continue zoloft despite sleepiness rather than switching to a different medication.  Will continue same.  Monitor.

## 2010-09-18 NOTE — Progress Notes (Signed)
Subjective:    Patient ID: Cheyenne Hawkins, female    DOB: 08-Feb-1975, 35 y.o.   MRN: [id number]  HPI  Depression- feels more sleepy on current dose of Zoloft.  Took one dose of effexor only- she was "paranoid" about the side effects.  Felt that she had trouble sleeping the night that she took that first dose.  Since she has increased her zoloft from 50 to 100mg  she feels more "pleasant."    "Itching"- feels like my skin is crawling.  Took zyrtec OTC, feels better, no rash.  Anemia- Periods are improving, but heavy at times.    Review of Systems See HPI  Past Medical History  Diagnosis Date  . History of chicken pox   . GERD (gastroesophageal reflux disease)   . Heart murmur   . Grave's disease 2002    s/p thyroidectomy    History   Social History  . Marital Status: Married    Spouse Name: N/A    Number of Children: 3  . Years of Education: N/A   Occupational History  .  [employer]   Social History Main Topics  . Smoking status: Never Smoker   . Smokeless tobacco: Never Used  . Alcohol Use: 6.0 oz/week    10 Glasses of wine per week  . Drug Use: No  . Sexually Active: Not on file   Other Topics Concern  . Not on file   Social History Narrative   Regular exercise:  NoCaffeine Use: 2 drinks weekly    Past Surgical History  Procedure Date  . Thyroidectomy 2003    for graves disease  . Cesarean section 2001  . Cesarean section 2003  . Cesarean section 2007    Family History  Problem Relation Age of Onset  . Alcohol abuse Mother   . Depression Mother   . Bipolar disorder Mother   . Alcohol abuse Father   . Diabetes Father   . Hypertension Father   . Cancer Maternal Grandmother     colon  . Depression Maternal Grandmother   . Diabetes Maternal Grandmother   . Hypertension Maternal Grandmother   . Cancer Paternal Grandmother     breast  . Diabetes Paternal Grandmother   . Hypertension Paternal Grandmother     No Known Allergies  Current  Outpatient Prescriptions on File Prior to Visit  Medication Sig Dispense Refill  . levothyroxine (SYNTHROID) 125 MCG tablet Take 125 mcg by mouth daily.        Marland Kitchen omeprazole (PRILOSEC) 20 MG capsule Take 1 capsule (20 mg total) by mouth daily.  30 capsule  5  . sertraline (ZOLOFT) 100 MG tablet Take 1 tablet (100 mg total) by mouth daily.  30 tablet  5  . valACYclovir (VALTREX) 500 MG tablet Take 500 mg by mouth as needed.          BP 110/78  Pulse 72  Temp(Src) 98.1 F (36.7 C) (Oral)  Resp 18  Ht 5' 5.5" (1.664 m)  Wt 165 lb (74.844 kg)  BMI 27.04 kg/m2       Objective:   Physical Exam  Constitutional: She appears well-developed and well-nourished.  Cardiovascular: Normal rate and regular rhythm.   No murmur heard. Pulmonary/Chest: Effort normal and breath sounds normal. No respiratory distress. She has no wheezes. She has no rales. She exhibits no tenderness.  Skin: Skin is warm and dry. No rash noted. No erythema.  Psychiatric: She has a normal mood and affect. Her behavior is  normal. Judgment and thought content normal.          Assessment & Plan:  25 minutes spent with the patient today.  >50% of this time was spent counseling pt on her depression.

## 2010-09-18 NOTE — Assessment & Plan Note (Signed)
Resolved, no visible rashes.  Monitor.

## 2010-09-18 NOTE — Patient Instructions (Signed)
Please complete your lab work on the first floor.  Follow up in 3 months, sooner if problems or concerns.  

## 2010-09-21 ENCOUNTER — Telehealth: Payer: Self-pay | Admitting: *Deleted

## 2010-09-21 NOTE — Telephone Encounter (Signed)
Tests have been added per Selena Batten at [facility].

## 2010-09-21 NOTE — Telephone Encounter (Signed)
Message copied by Kathi Simpers on Mon Sep 21, 2010  5:32 PM ------      Message from: O'SULLIVAN, MELISSA      Created: Sun Sep 20, 2010  8:31 AM       Could you pls call lab and see if they can add on:            Ferritin TIBC, iron, folate, B12? Diagnosis anemia.            Thanks

## 2010-09-22 LAB — FOLATE: Folate: 12.2 ng/mL

## 2010-09-22 LAB — FERRITIN: Ferritin: 6 ng/mL — ABNORMAL LOW (ref 10–291)

## 2010-09-23 ENCOUNTER — Telehealth: Payer: Self-pay | Admitting: *Deleted

## 2010-09-23 DIAGNOSIS — E039 Hypothyroidism, unspecified: Secondary | ICD-10-CM

## 2010-09-23 LAB — IRON AND TIBC
Iron: 67 ug/dL (ref 42–145)
TIBC: 460 ug/dL (ref 250–470)
UIBC: 393 ug/dL (ref 125–400)

## 2010-09-23 MED ORDER — LEVOTHYROXINE SODIUM 150 MCG PO TABS: 150.0000 ug | ORAL_TABLET | Freq: Every day | ORAL | Status: DC

## 2010-09-23 NOTE — Telephone Encounter (Signed)
Order forwarded to the lab.

## 2010-09-23 NOTE — Telephone Encounter (Signed)
Called pt, instructed her to increase synthroid to 150 mcg and go to lab in 1 month for f/u TSH. She verbalizes understanding.

## 2010-09-23 NOTE — Telephone Encounter (Signed)
Received call from pt requesting lab results. Add on tests for iron/tibc are still pending. Spoke to [place], result is still pending. She will send message to the lab requesting status of result.

## 2010-09-24 ENCOUNTER — Encounter: Payer: Self-pay | Admitting: Family

## 2010-09-24 ENCOUNTER — Telehealth: Payer: Self-pay | Admitting: Family

## 2010-09-25 NOTE — Telephone Encounter (Signed)
Opened in error

## 2010-11-04 NOTE — Telephone Encounter (Signed)
Left message on pt's voicemail to return to the lab this week for TSH.

## 2010-11-07 LAB — TSH: TSH: 2.162 u[IU]/mL (ref 0.350–4.500)

## 2010-11-08 ENCOUNTER — Encounter: Payer: Self-pay | Admitting: Family

## 2010-12-02 ENCOUNTER — Other Ambulatory Visit: Payer: Self-pay | Admitting: Family

## 2010-12-02 NOTE — Telephone Encounter (Signed)
Rx refill sent to pharmacy. 

## 2010-12-15 ENCOUNTER — Ambulatory Visit (INDEPENDENT_AMBULATORY_CARE_PROVIDER_SITE_OTHER): Payer: 59 | Admitting: Family

## 2010-12-15 ENCOUNTER — Encounter: Payer: Self-pay | Admitting: Family

## 2010-12-15 DIAGNOSIS — K219 Gastro-esophageal reflux disease without esophagitis: Secondary | ICD-10-CM

## 2010-12-15 DIAGNOSIS — K5900 Constipation, unspecified: Secondary | ICD-10-CM

## 2010-12-15 DIAGNOSIS — F341 Dysthymic disorder: Secondary | ICD-10-CM

## 2010-12-15 DIAGNOSIS — E890 Postprocedural hypothyroidism: Secondary | ICD-10-CM

## 2010-12-15 DIAGNOSIS — F418 Other specified anxiety disorders: Secondary | ICD-10-CM

## 2010-12-15 MED ORDER — SERTRALINE HCL 100 MG PO TABS: 100.0000 mg | ORAL_TABLET | Freq: Every day | ORAL | Status: DC

## 2010-12-15 MED ORDER — OMEPRAZOLE 20 MG PO CPDR: 20.0000 mg | DELAYED_RELEASE_CAPSULE | Freq: Every day | ORAL | Status: DC

## 2010-12-15 NOTE — Assessment & Plan Note (Signed)
Recommended use of a fiber supplement such as the metamucil wafers. Also recommended that she increase her water/fresh fruits/veggies.

## 2010-12-15 NOTE — Assessment & Plan Note (Signed)
Stable on sertraline, continue same.  ?

## 2010-12-15 NOTE — Assessment & Plan Note (Signed)
Stable with PRN prilosec.

## 2010-12-15 NOTE — Progress Notes (Signed)
Subjective:    Patient ID: Cheyenne Hawkins, female    DOB: 01/02/1976, 35 y.o.   MRN: [id number]  HPI  Ms.  Cheyenne Hawkins is a 35 yr old female who presents today for follow up.   1)Depression- She feels that this is well controlled on sertraline. Still tired, but she attributes this to her schedule (school/work/3 children).  Notes occasional anxiety.  2)Constipation- feels unbalanced- she continues to use suppositories.  She occasionally uses dulcolax.  3) GERD- reports that she uses the prilosec PRN. This is stable.    She reports that she had viral illness the day after thanksgiving, but now feels better.  Review of Systems See HPI  Past Medical History  Diagnosis Date  . History of chicken pox   . GERD (gastroesophageal reflux disease)   . Heart murmur   . Grave's disease 2002    s/p thyroidectomy    History   Social History  . Marital Status: Married    Spouse Name: N/A    Number of Children: 3  . Years of Education: N/A   Occupational History  .  [employer]   Social History Main Topics  . Smoking status: Never Smoker   . Smokeless tobacco: Never Used  . Alcohol Use: 6.0 oz/week    10 Glasses of wine per week  . Drug Use: No  . Sexually Active: Not on file   Other Topics Concern  . Not on file   Social History Narrative   Regular exercise:  NoCaffeine Use: 2 drinks weekly    Past Surgical History  Procedure Date  . Thyroidectomy 2003    for graves disease  . Cesarean section 2001  . Cesarean section 2003  . Cesarean section 2007    Family History  Problem Relation Age of Onset  . Alcohol abuse Mother   . Depression Mother   . Bipolar disorder Mother   . Alcohol abuse Father   . Diabetes Father   . Hypertension Father   . Cancer Maternal Grandmother     colon  . Depression Maternal Grandmother   . Diabetes Maternal Grandmother   . Hypertension Maternal Grandmother   . Cancer Paternal Grandmother     breast  . Diabetes Paternal  Grandmother   . Hypertension Paternal Grandmother     No Known Allergies  Current Outpatient Prescriptions on File Prior to Visit  Medication Sig Dispense Refill  . levothyroxine (SYNTHROID, LEVOTHROID) 150 MCG tablet TAKE 1 TABLET BY MOUTH DAILY  30 tablet  1  . omeprazole (PRILOSEC) 20 MG capsule Take 1 capsule (20 mg total) by mouth daily.  30 capsule  5  . sertraline (ZOLOFT) 100 MG tablet Take 1 tablet (100 mg total) by mouth daily.  30 tablet  5  . valACYclovir (VALTREX) 500 MG tablet Take 500 mg by mouth as needed.          BP 104/74  Temp(Src) 97.9 F (36.6 C) (Oral)  Ht 5' 5.5" (1.664 m)  Wt 169 lb 0.6 oz (76.676 kg)  BMI 27.70 kg/m2  LMP 12/10/2010       Objective:   Physical Exam  Constitutional: She appears well-developed and well-nourished. No distress.  HENT:  Right Ear: Tympanic membrane and ear canal normal.  Left Ear: Tympanic membrane and ear canal normal.  Mouth/Throat: No posterior oropharyngeal edema or posterior oropharyngeal erythema.  Eyes: Conjunctivae are normal. No scleral icterus.  Neck: Normal range of motion. Neck supple.  Cardiovascular: Normal rate and regular  rhythm.   No murmur heard. Pulmonary/Chest: Effort normal and breath sounds normal. No respiratory distress. She has no wheezes. She has no rales. She exhibits no tenderness.  Musculoskeletal: She exhibits no edema.  Skin: Skin is warm and dry. No erythema.  Psychiatric: She has a normal mood and affect. Her behavior is normal. Judgment and thought content normal.          Assessment & Plan:

## 2010-12-15 NOTE — Patient Instructions (Signed)
Please follow up in 6 months, sooner if problems or concerns.  

## 2010-12-15 NOTE — Assessment & Plan Note (Signed)
Follow up TSH was stable, continue synthroid.

## 2011-03-17 ENCOUNTER — Encounter: Payer: Self-pay | Admitting: Family

## 2011-03-17 ENCOUNTER — Ambulatory Visit (INDEPENDENT_AMBULATORY_CARE_PROVIDER_SITE_OTHER): Payer: 59 | Admitting: Family

## 2011-03-17 VITALS — BP 110/80 | HR 83 | Temp 98.1°F | Resp 16 | Wt 172.0 lb

## 2011-03-17 DIAGNOSIS — Z862 Personal history of diseases of the blood and blood-forming organs and certain disorders involving the immune mechanism: Secondary | ICD-10-CM

## 2011-03-17 DIAGNOSIS — E890 Postprocedural hypothyroidism: Secondary | ICD-10-CM

## 2011-03-17 DIAGNOSIS — R109 Unspecified abdominal pain: Secondary | ICD-10-CM | POA: Insufficient documentation

## 2011-03-17 DIAGNOSIS — K219 Gastro-esophageal reflux disease without esophagitis: Secondary | ICD-10-CM

## 2011-03-17 DIAGNOSIS — K589 Irritable bowel syndrome without diarrhea: Secondary | ICD-10-CM

## 2011-03-17 DIAGNOSIS — K5900 Constipation, unspecified: Secondary | ICD-10-CM

## 2011-03-17 LAB — HEPATIC FUNCTION PANEL
AST: 19 U/L (ref 0–37)
Bilirubin, Direct: <0.1 mg/dL (ref 0.0–0.3)
Total Protein: 7.6 g/dL (ref 6.0–8.3)

## 2011-03-17 MED ORDER — LUBIPROSTONE 8 MCG PO CAPS: 8.0000 ug | ORAL_CAPSULE | Freq: Two times a day (BID) | ORAL | Status: AC

## 2011-03-17 MED ORDER — ESOMEPRAZOLE MAGNESIUM 40 MG PO CPDR: 40.0000 mg | DELAYED_RELEASE_CAPSULE | Freq: Every day | ORAL | Status: DC

## 2011-03-17 NOTE — Assessment & Plan Note (Signed)
Stable, following with endo.

## 2011-03-17 NOTE — Assessment & Plan Note (Signed)
May be multifactorial due to IBS and GERD.  But need to exclude GB issues.  Obtain LFT's and abdominal ultrasound.  If no improvement in 1 month, plan referral to GI.

## 2011-03-17 NOTE — Progress Notes (Signed)
Subjective:    Patient ID: Cheyenne Hawkins, female    DOB: [birth year], 36 y.o.   MRN: [id number]  HPI  [name] is a 36 yr old female who presents today with complaint of abdominal pain.  Sharp at times, she reports pain can be "anywhere in the stomach." Reports terrible gas.  Feels like food comes up in her throat.  Sometimes has make her feel better after she makes herself vomit- though she tells me that she does not like to do this.   She started prilosec OTC 1 week ago.  + nausea- worse after greasy meals.  Denies RUQ pain.  Saw endo- Margaretmary Bayley.  Reports that her iron was low.  Was told to start on an iron supplement which she has not yet started.   IBS- She has been given samples of amitiza which helped with constipation. She is requesting a refill. Review of Systems See HPI  Past Medical History  Diagnosis Date  . History of chicken pox   . GERD (gastroesophageal reflux disease)   . Heart murmur   . Grave's disease 2002    s/p thyroidectomy    History   Social History  . Marital Status: Married    Spouse Name: N/A    Number of Children: 3  . Years of Education: N/A   Occupational History  .  [employer]   Social History Main Topics  . Smoking status: Never Smoker   . Smokeless tobacco: Never Used  . Alcohol Use: 6.0 oz/week    10 Glasses of wine per week  . Drug Use: No  . Sexually Active: Not on file   Other Topics Concern  . Not on file   Social History Narrative   Regular exercise:  NoCaffeine Use: 2 drinks weekly    Past Surgical History  Procedure Date  . Thyroidectomy 2003    for graves disease  . Cesarean section 2001  . Cesarean section 2003  . Cesarean section 2007    Family History  Problem Relation Age of Onset  . Alcohol abuse Mother   . Depression Mother   . Bipolar disorder Mother   . Alcohol abuse Father   . Diabetes Father   . Hypertension Father   . Cancer Maternal Grandmother     colon  . Depression Maternal  Grandmother   . Diabetes Maternal Grandmother   . Hypertension Maternal Grandmother   . Cancer Paternal Grandmother     breast  . Diabetes Paternal Grandmother   . Hypertension Paternal Grandmother     No Known Allergies  Current Outpatient Prescriptions on File Prior to Visit  Medication Sig Dispense Refill  . levothyroxine (SYNTHROID, LEVOTHROID) 150 MCG tablet TAKE 1 TABLET BY MOUTH DAILY  30 tablet  1  . omeprazole (PRILOSEC) 20 MG capsule Take 1 capsule (20 mg total) by mouth daily.  30 capsule  5  . sertraline (ZOLOFT) 100 MG tablet Take 1 tablet (100 mg total) by mouth daily.  30 tablet  5  . valACYclovir (VALTREX) 500 MG tablet Take 500 mg by mouth as needed.          BP 110/80  Pulse 83  Temp(Src) 98.1 F (36.7 C) (Oral)  Resp 16  Wt 172 lb (78.019 kg)  SpO2 99%  LMP 03/03/2011       Objective:   Physical Exam  Constitutional: She appears well-developed and well-nourished. No distress.  Cardiovascular: Normal rate and regular rhythm.   No murmur  heard. Pulmonary/Chest: Effort normal and breath sounds normal. No respiratory distress. She has no wheezes. She has no rales. She exhibits no tenderness.  Abdominal: Soft. Bowel sounds are normal. She exhibits no distension and no mass. There is no tenderness. There is no rebound and no guarding.  Psychiatric: She has a normal mood and affect. Her behavior is normal. Judgment and thought content normal.          Assessment & Plan:

## 2011-03-17 NOTE — Patient Instructions (Signed)
Please complete your blood work prior to leaving. Schedule abdominal ultrasound on the first floor today. Follow up in 1 month, sooner if problems/concerns.   Complete stool kit and return by mail.

## 2011-03-17 NOTE — Assessment & Plan Note (Signed)
Chronic constipation, relieved by amitiza.  Will refill. Likely has IBS.  Amitiza 8mg  #20 samples provided today.

## 2011-03-17 NOTE — Assessment & Plan Note (Signed)
Deteriorated.  Will change prilosec otc to Nexium 40mg .  #28 samples provided today.

## 2011-03-17 NOTE — Assessment & Plan Note (Deleted)
Chronic constipation, relieved by amitiza.  Will refill. Likely has IBS.  Amitiza 8mg #20 samples provided today.  

## 2011-03-17 NOTE — Assessment & Plan Note (Signed)
She will start iron supplement, will also check IFOB to exclude GI loss.

## 2011-03-18 ENCOUNTER — Encounter: Payer: Self-pay | Admitting: Family

## 2011-03-19 ENCOUNTER — Other Ambulatory Visit (HOSPITAL_BASED_OUTPATIENT_CLINIC_OR_DEPARTMENT_OTHER): Payer: 59

## 2011-03-19 ENCOUNTER — Ambulatory Visit (HOSPITAL_BASED_OUTPATIENT_CLINIC_OR_DEPARTMENT_OTHER)
Admission: RE | Admit: 2011-03-19 | Discharge: 2011-03-19 | Disposition: A | Discharge status: Home / Self Care | Payer: 59 | Source: Ambulatory Visit | Attending: Family | Admitting: Family

## 2011-03-19 DIAGNOSIS — R109 Unspecified abdominal pain: Secondary | ICD-10-CM | POA: Insufficient documentation

## 2011-03-19 DIAGNOSIS — K7689 Other specified diseases of liver: Secondary | ICD-10-CM | POA: Insufficient documentation

## 2011-03-21 ENCOUNTER — Encounter: Payer: Self-pay | Admitting: Family

## 2011-03-21 ENCOUNTER — Telehealth: Payer: Self-pay | Admitting: Family

## 2011-03-21 DIAGNOSIS — K760 Fatty (change of) liver, not elsewhere classified: Secondary | ICD-10-CM

## 2011-03-21 HISTORY — DX: Fatty (change of) liver, not elsewhere classified: K76.0

## 2011-03-21 NOTE — Telephone Encounter (Signed)
Pls call pt and let her know that Gallbladder looks good on ultrasound. It did note fatty liver- this can be treated with low fat diet, exercise, weight loss.

## 2011-03-22 NOTE — Telephone Encounter (Signed)
Pt.notified

## 2011-04-08 ENCOUNTER — Other Ambulatory Visit: Payer: Self-pay | Admitting: Family

## 2011-04-08 DIAGNOSIS — R109 Unspecified abdominal pain: Secondary | ICD-10-CM

## 2011-05-19 ENCOUNTER — Encounter: Payer: Self-pay | Admitting: Family

## 2011-05-19 ENCOUNTER — Other Ambulatory Visit: Payer: 59

## 2011-05-19 DIAGNOSIS — R109 Unspecified abdominal pain: Secondary | ICD-10-CM

## 2011-05-19 DIAGNOSIS — E039 Hypothyroidism, unspecified: Secondary | ICD-10-CM

## 2011-05-19 LAB — FECAL OCCULT BLOOD, IMMUNOCHEMICAL: Fecal Occult Bld: NEGATIVE

## 2011-05-21 ENCOUNTER — Ambulatory Visit (INDEPENDENT_AMBULATORY_CARE_PROVIDER_SITE_OTHER): Payer: 59 | Admitting: Family

## 2011-05-21 ENCOUNTER — Encounter: Payer: Self-pay | Admitting: Family

## 2011-05-21 VITALS — BP 100/72 | HR 67 | Temp 97.7°F | Resp 16 | Ht 65.5 in | Wt 169.1 lb

## 2011-05-21 DIAGNOSIS — K7689 Other specified diseases of liver: Secondary | ICD-10-CM

## 2011-05-21 DIAGNOSIS — K219 Gastro-esophageal reflux disease without esophagitis: Secondary | ICD-10-CM

## 2011-05-21 DIAGNOSIS — K589 Irritable bowel syndrome without diarrhea: Secondary | ICD-10-CM

## 2011-05-21 DIAGNOSIS — F341 Dysthymic disorder: Secondary | ICD-10-CM

## 2011-05-21 DIAGNOSIS — F418 Other specified anxiety disorders: Secondary | ICD-10-CM

## 2011-05-21 DIAGNOSIS — K760 Fatty (change of) liver, not elsewhere classified: Secondary | ICD-10-CM

## 2011-05-21 MED ORDER — OMEPRAZOLE 40 MG PO CPDR: 40.0000 mg | DELAYED_RELEASE_CAPSULE | Freq: Every day | ORAL | Status: DC

## 2011-05-21 NOTE — Progress Notes (Signed)
Subjective:    Patient ID: Cheyenne Hawkins, female    DOB: 05/04/1975, 36 y.o.   MRN: [id number]  HPI  [name] is a 36 yr old female who presents today for follow up.  1)Abdominal pain- she reports that her constipation is improved with the use of Amitiza.  She reports that she does not take every day due to cost, but generally takes every other day.  She notes less bloating, but is still "gassy."  2)GERD- reports that gerd symptoms are resolved with nexium.  She finds the nexium too expensive however.  3)Depression/anxiety- feeling well on zoloft.  Denies tearfulness.  Did have a "blow out" with her husband recently when she became very emotional.     Review of Systems See HPI  Past Medical History  Diagnosis Date  . History of chicken pox   . GERD (gastroesophageal reflux disease)   . Heart murmur   . Grave's disease 2002    s/p thyroidectomy  . Fatty liver 03/21/2011    History   Social History  . Marital Status: Married    Spouse Name: N/A    Number of Children: 3  . Years of Education: N/A   Occupational History  .  [employer]   Social History Main Topics  . Smoking status: Never Smoker   . Smokeless tobacco: Never Used  . Alcohol Use: 6.0 oz/week    10 Glasses of wine per week  . Drug Use: No  . Sexually Active: Not on file   Other Topics Concern  . Not on file   Social History Narrative   Regular exercise:  NoCaffeine Use: 2 drinks weekly    Past Surgical History  Procedure Date  . Thyroidectomy 2003    for graves disease  . Cesarean section 2001  . Cesarean section 2003  . Cesarean section 2007    Family History  Problem Relation Age of Onset  . Alcohol abuse Mother   . Depression Mother   . Bipolar disorder Mother   . Alcohol abuse Father   . Diabetes Father   . Hypertension Father   . Cancer Maternal Grandmother     colon  . Depression Maternal Grandmother   . Diabetes Maternal Grandmother   . Hypertension Maternal  Grandmother   . Cancer Paternal Grandmother     breast  . Diabetes Paternal Grandmother   . Hypertension Paternal Grandmother     No Known Allergies  Current Outpatient Prescriptions on File Prior to Visit  Medication Sig Dispense Refill  . sertraline (ZOLOFT) 100 MG tablet Take 1 tablet (100 mg total) by mouth daily.  30 tablet  5  . valACYclovir (VALTREX) 500 MG tablet Take 500 mg by mouth as needed.        Marland Kitchen DISCONTD: levothyroxine (SYNTHROID, LEVOTHROID) 150 MCG tablet TAKE 1 TABLET BY MOUTH DAILY  30 tablet  1    BP 100/72  Pulse 67  Temp(Src) 97.7 F (36.5 C) (Oral)  Resp 16  Ht 5' 5.5" (1.664 m)  Wt 169 lb 1.3 oz (76.694 kg)  BMI 27.71 kg/m2  SpO2 99%  LMP 04/30/2011       Objective:   Physical Exam  Constitutional: She appears well-developed and well-nourished. No distress.  Cardiovascular: Normal rate and regular rhythm.   No murmur heard. Pulmonary/Chest: Effort normal and breath sounds normal. No respiratory distress. She has no wheezes. She has no rales. She exhibits no tenderness.  Abdominal: Soft. Bowel sounds are normal. She  exhibits no distension and no mass. There is no tenderness. There is no rebound and no guarding.  Musculoskeletal: Normal range of motion. She exhibits no edema.  Psychiatric: She has a normal mood and affect. Her behavior is normal. Judgment and thought content normal.          Assessment & Plan:

## 2011-05-21 NOTE — Assessment & Plan Note (Signed)
Incidental note on ultrasound last visit. We discussed low fat, low cholesterol diet, exercise and weight loss. Plan to obtain FLP next visit at her physical.

## 2011-05-21 NOTE — Assessment & Plan Note (Signed)
Seem stable on zoloft.  Monitor.

## 2011-05-21 NOTE — Assessment & Plan Note (Signed)
Improved. Was on omeprazole 20 prior to nexium 40.  Will try omeprazole 40 instead as this is more affordable for her.

## 2011-05-21 NOTE — Assessment & Plan Note (Signed)
Symptoms improved with amitiza.  I did recommend that she try to avoid dairy to see if this helps with her flatulence.

## 2011-05-21 NOTE — Patient Instructions (Signed)
Please schedule your fasting physical this summer.

## 2011-06-10 ENCOUNTER — Telehealth: Payer: Self-pay | Admitting: Family

## 2011-06-10 MED ORDER — SERTRALINE HCL 100 MG PO TABS: 100.0000 mg | ORAL_TABLET | Freq: Every day | ORAL | Status: DC

## 2011-06-10 NOTE — Telephone Encounter (Signed)
Refill- sertraline 100mg  tablets. Take one tablet by mouth every day. Qty 30 last fill 4.23.13

## 2011-06-10 NOTE — Telephone Encounter (Signed)
Rx refill sent to pharmacy. 

## 2011-06-15 ENCOUNTER — Ambulatory Visit (INDEPENDENT_AMBULATORY_CARE_PROVIDER_SITE_OTHER): Payer: 59 | Admitting: Family

## 2011-06-15 ENCOUNTER — Other Ambulatory Visit: Payer: Self-pay | Admitting: Family

## 2011-06-15 ENCOUNTER — Encounter: Payer: Self-pay | Admitting: Family

## 2011-06-15 VITALS — BP 94/70 | HR 80 | Temp 98.4°F | Resp 16 | Ht 65.5 in | Wt 171.1 lb

## 2011-06-15 DIAGNOSIS — L293 Anogenital pruritus, unspecified: Secondary | ICD-10-CM

## 2011-06-15 DIAGNOSIS — N898 Other specified noninflammatory disorders of vagina: Secondary | ICD-10-CM

## 2011-06-15 DIAGNOSIS — E039 Hypothyroidism, unspecified: Secondary | ICD-10-CM

## 2011-06-15 DIAGNOSIS — K589 Irritable bowel syndrome without diarrhea: Secondary | ICD-10-CM

## 2011-06-15 DIAGNOSIS — Z0000 Encounter for general adult medical examination without abnormal findings: Secondary | ICD-10-CM

## 2011-06-15 LAB — CBC WITH DIFFERENTIAL/PLATELET
Basophils Absolute: 0 10*3/uL (ref 0.0–0.1)
HCT: 31.4 % — ABNORMAL LOW (ref 36.0–46.0)
Hemoglobin: 9.9 g/dL — ABNORMAL LOW (ref 12.0–15.0)
Lymphs Abs: 2 10*3/uL (ref 0.7–4.0)
MCH: 25.9 pg — ABNORMAL LOW (ref 26.0–34.0)
MCHC: 31.5 g/dL (ref 30.0–36.0)
MCV: 82.2 fL (ref 78.0–100.0)
Monocytes Absolute: 0.5 10*3/uL (ref 0.1–1.0)
Neutrophils Relative %: 66 % (ref 43–77)
RBC: 3.82 MIL/uL — ABNORMAL LOW (ref 3.87–5.11)

## 2011-06-15 LAB — LIPID PANEL
Cholesterol: 140 mg/dL (ref 0–200)
Total CHOL/HDL Ratio: 2.6 Ratio
Triglycerides: 45 mg/dL (ref <150)
VLDL: 9 mg/dL (ref 0–40)

## 2011-06-15 LAB — BASIC METABOLIC PANEL WITH GFR
BUN: 13 mg/dL (ref 6–23)
Creat: 0.67 mg/dL (ref 0.50–1.10)
GFR, Est African American: >89 mL/min
GFR, Est Non African American: >89 mL/min

## 2011-06-15 LAB — HEPATIC FUNCTION PANEL
ALT: 12 U/L (ref 0–35)
Alkaline Phosphatase: 62 U/L (ref 39–117)

## 2011-06-15 MED ORDER — LUBIPROSTONE 24 MCG PO CAPS: 24.0000 ug | ORAL_CAPSULE | Freq: Two times a day (BID) | ORAL | Status: AC

## 2011-06-15 NOTE — Progress Notes (Signed)
  Subjective:    Patient ID: Cheyenne Hawkins, female    DOB: 07-11-1975, 36 y.o.   MRN: [id number]  HPI  Cheyenne Hawkins is a 36 yr old female who presents today for follow up.  IBS-  Taking amitza 8mg  bid prn.  She reports that her previous dosing was the 24 mcg dose. She reports that she has straining with BM's and still has some constipation.    She reports that her husband is diabetic and 2-3 days after intercourse she develops vaginal itching which is relieved by monistat. She has no present symptoms.  Review of Systems    see HPI Objective:   Physical Exam  Constitutional: She appears well-developed and well-nourished. No distress.  Cardiovascular: Normal rate and regular rhythm.   No murmur heard. Pulmonary/Chest: Effort normal and breath sounds normal. No respiratory distress. She has no wheezes. She has no rales. She exhibits no tenderness.  Psychiatric: She has a normal mood and affect. Her behavior is normal. Judgment and thought content normal.          Assessment & Plan:

## 2011-06-15 NOTE — Patient Instructions (Signed)
Please complete your blood work prior to leaving.  Schedule a physical this summer at your convenience.

## 2011-06-16 LAB — URINALYSIS, ROUTINE W REFLEX MICROSCOPIC
Bilirubin Urine: NEGATIVE
Hgb urine dipstick: NEGATIVE
Ketones, ur: NEGATIVE mg/dL
Protein, ur: NEGATIVE mg/dL
Urobilinogen, UA: 0.2 mg/dL (ref 0.0–1.0)

## 2011-06-16 LAB — IRON AND TIBC
%SAT: 13 % — ABNORMAL LOW (ref 20–55)
Iron: 56 ug/dL (ref 42–145)

## 2011-06-16 LAB — VITAMIN B12: Vitamin B-12: 466 pg/mL (ref 211–911)

## 2011-06-16 LAB — FERRITIN: Ferritin: 4 ng/mL — ABNORMAL LOW (ref 10–291)

## 2011-06-17 ENCOUNTER — Telehealth: Payer: Self-pay | Admitting: Family

## 2011-06-17 DIAGNOSIS — D649 Anemia, unspecified: Secondary | ICD-10-CM

## 2011-06-17 MED ORDER — FERROUS SULFATE ER 142 (45 FE) MG PO TBCR: EXTENDED_RELEASE_TABLET | ORAL | Status: DC

## 2011-06-17 MED ORDER — DOCUSATE SODIUM 100 MG PO CAPS: 100.0000 mg | ORAL_CAPSULE | Freq: Two times a day (BID) | ORAL | Status: AC

## 2011-06-17 NOTE — Telephone Encounter (Addendum)
Please call pt and let her know that her lab work shows iron deficiency anemia.  I would like for her to add iron supplement bid (see meds) complete ifob (anemia), also repeat cbc in 3 months. She will need to add colace 1-2 times daily to help prevent worsening constipation on the iron.  Cholesterol, liver, kidney, sugar, urine- normal.

## 2011-06-17 NOTE — Telephone Encounter (Signed)
Call placed to patient at [phone], she was informed per Sandford Craze instructions. She stated that she has already completed a hemoccult card.

## 2011-06-18 DIAGNOSIS — N898 Other specified noninflammatory disorders of vagina: Secondary | ICD-10-CM | POA: Insufficient documentation

## 2011-06-18 NOTE — Assessment & Plan Note (Signed)
No symptoms today.  Recommended that she come in for wet prep if recurrent symptoms.

## 2011-06-18 NOTE — Assessment & Plan Note (Signed)
Deteriorated. Will place back on the 24 mcg dose amitiza.

## 2011-06-25 ENCOUNTER — Encounter: Payer: Self-pay | Admitting: Internal Medicine

## 2011-06-25 ENCOUNTER — Ambulatory Visit (INDEPENDENT_AMBULATORY_CARE_PROVIDER_SITE_OTHER): Payer: 59 | Admitting: Internal Medicine

## 2011-06-25 VITALS — BP 100/60 | HR 95 | Temp 98.5°F | Resp 18 | Ht 65.5 in | Wt 169.0 lb

## 2011-06-25 DIAGNOSIS — N898 Other specified noninflammatory disorders of vagina: Secondary | ICD-10-CM

## 2011-06-25 MED ORDER — FLUCONAZOLE 100 MG PO TABS: 100.0000 mg | ORAL_TABLET | Freq: Every day | ORAL | Status: AC

## 2011-06-25 NOTE — Progress Notes (Signed)
  Subjective:    Patient ID: Cheyenne Hawkins, female    DOB: [birth year], 36 y.o.   MRN: [id number]  HPI Pt presents to clinic for evaluation of recurrent yeast infections. States has possible yeast vaginitis approx q 2 months typically treated with otc monistat. Improves but recurs. Notes vaginal discharge occurrence after intercourse with husband.  No other alleviating or exacerbating factors.  Past Medical History  Diagnosis Date  . History of chicken pox   . GERD (gastroesophageal reflux disease)   . Heart murmur   . Grave's disease 2002    s/p thyroidectomy  . Fatty liver 03/21/2011   Past Surgical History  Procedure Date  . Thyroidectomy 2003    for graves disease  . Cesarean section 2001  . Cesarean section 2003  . Cesarean section 2007    reports that she has never smoked. She has never used smokeless tobacco. She reports that she drinks about 6 ounces of alcohol per week. She reports that she does not use illicit drugs. family history includes Alcohol abuse in her father and mother; Bipolar disorder in her mother; Cancer in her maternal grandmother and paternal grandmother; Depression in her maternal grandmother and mother; Diabetes in her father, maternal grandmother, and paternal grandmother; and Hypertension in her father, maternal grandmother, and paternal grandmother. No Known Allergies   Review of Systems see hpi     Objective:   Physical Exam  Nursing note and vitals reviewed. Constitutional: She appears well-developed and well-nourished. No distress.  HENT:  Head: Normocephalic and atraumatic.  Genitourinary:       With female nurse escort exam if performed. External genitalia nl. Vaginal mucosa and cervix appear nl. Small amount of white/tan discharge in the vault.  Neurological: She is alert.  Skin: She is not diaphoretic.  Psychiatric: She has a normal mood and affect.          Assessment & Plan:

## 2011-06-28 LAB — GC/CHLAMYDIA PROBE AMP, GENITAL: GC Probe Amp, Genital: NEGATIVE

## 2011-07-01 DIAGNOSIS — N898 Other specified noninflammatory disorders of vagina: Secondary | ICD-10-CM | POA: Insufficient documentation

## 2011-07-01 NOTE — Assessment & Plan Note (Signed)
Obtain KOH, wet prep, GC/chlamydia. Reviewed past nl glucoses. Attempt diflucan 100mg  po qd x 7days pending lab results. Followup if no improvement or worsening.

## 2011-07-08 ENCOUNTER — Encounter: Payer: Self-pay | Admitting: Obstetrics and Gynecology

## 2011-07-08 ENCOUNTER — Ambulatory Visit (INDEPENDENT_AMBULATORY_CARE_PROVIDER_SITE_OTHER): Payer: 59 | Admitting: Obstetrics and Gynecology

## 2011-07-08 VITALS — BP 108/68 | Ht 65.0 in | Wt 169.0 lb

## 2011-07-08 DIAGNOSIS — Z01419 Encounter for gynecological examination (general) (routine) without abnormal findings: Secondary | ICD-10-CM

## 2011-07-08 DIAGNOSIS — B009 Herpesviral infection, unspecified: Secondary | ICD-10-CM

## 2011-07-08 NOTE — Progress Notes (Signed)
Last Pap: 2011 WNL: Yes Regular Periods:yes 06/2011  Contraception: BTL  Monthly Breast exam:yes Tetanus<50yrs:yes Nl.Bladder Function:yes Daily BMs:no taking Amitiza for constipation.  Healthy Diet:yes Calcium:no Mammogram:yes 2008 WNL Exercise:yes Seatbelt: yes Abuse at home: no Stressful work:yes Sigmoid-colonoscopy: less than 10 years ago. WNL Bone Density: No No IM bleeding Subjective:    Cheyenne Hawkins is a 36 y.o. female, [phone], who presents for an annual exam. She states that she has had several episodes of vaginitis in the past year her most recently less than a month ago. She was completely evaluated with STD testing as well as wet prep and diagnosed with Monilia. She was treated and now has no symptoms.    History   Social History  . Marital Status: Married    Spouse Name: N/A    Number of Children: 3  . Years of Education: N/A   Occupational History  .  [employer]   Social History Main Topics  . Smoking status: Never Smoker   . Smokeless tobacco: Never Used  . Alcohol Use: 6.0 oz/week    10 Glasses of wine per week  . Drug Use: No  . Sexually Active: Yes    Birth Control/ Protection: Surgical     btl   Other Topics Concern  . None   Social History Narrative   Regular exercise:  NoCaffeine Use: 2 drinks weekly    Menstrual cycle:   LMP: Patient's last menstrual period was 06/14/2011.           Cycle:  The following portions of the patient's history were reviewed and updated as appropriate: allergies, current medications, past family history, past medical history, past social history, past surgical history and problem list.  Review of Systems Pertinent items are noted in HPI. Breast:Negative for breast lump,nipple discharge or nipple retraction Gastrointestinal: Negative for abdominal pain, change in bowel habits or rectal bleeding Urinary:negative   Objective:    BP 108/68  Ht 5\' 5"  (1.651 m)  Wt 169 lb (76.658 kg)  BMI 28.12 kg/m2   LMP 06/14/2011    Weight:  Wt Readings from Last 1 Encounters:  07/08/11 169 lb (76.658 kg)          BMI: Body mass index is 28.12 kg/(m^2).  General Appearance: Alert, appropriate appearance for age. No acute distress HEENT: Grossly normal Neck / Thyroid: Supple, no masses, nodes or enlargement Lungs: clear to auscultation bilaterally Back: No CVA tenderness Breast Exam: No masses or nodes.No dimpling, nipple retraction or discharge. Cardiovascular: Regular rate and rhythm. S1, S2, no murmur Gastrointestinal: Soft, non-tender, no masses or organomegaly Pelvic Exam: External genitalia: normal general appearance Vaginal: normal mucosa without prolapse or lesions Cervix: normal appearance Adnexa: normal bimanual exam Uterus: upper limits normal size mobile and nontender Rectovaginal: normal rectal, no masses Lymphatic Exam: Non-palpable nodes in neck, clavicular, axillary, or inguinal regions Skin: no rash or abnormalities Neurologic: Normal gait and speech, no tremor  Psychiatric: Alert and oriented, appropriate affect.   Wet Prep:not applicable Urinalysis:not applicable UPT: Not done   Assessment:    Normal gyn exam history of herpes simplex virus 2 with infrequent outbreaks  Status post thyroidectomy now euthyroid on medication History of recent monilial infection   Plan:    mammogram pap smear always normal in the past and next is due in 2014 with human papilloma virus assessment return annually or prn STD screening: declined, recently done Patient information on recurrent Monilia Contraception:bilateral tubal ligation Continue Valtrex  Donna Snooks PMD

## 2011-07-08 NOTE — Patient Instructions (Signed)
Monilial Vaginitis Vaginitis in a soreness, swelling and redness (inflammation) of the vagina and vulva. Monilial vaginitis is not a sexually transmitted infection. CAUSES  Yeast vaginitis is caused by yeast (candida) that is normally found in your vagina. With a yeast infection, the candida has overgrown in number to a point that upsets the chemical balance. SYMPTOMS   White, thick vaginal discharge.   Swelling, itching, redness and irritation of the vagina and possibly the lips of the vagina (vulva).   Burning or painful urination.   Painful intercourse.  DIAGNOSIS  Things that may contribute to monilial vaginitis are:  Postmenopausal and virginal states.   Pregnancy.   Infections.   Being tired, sick or stressed, especially if you had monilial vaginitis in the past.   Diabetes. Good control will help lower the chance.   Birth control pills.   Tight fitting garments.   Using bubble bath, feminine sprays, douches or deodorant tampons.   Taking certain medications that kill germs (antibiotics).   Sporadic recurrence can occur if you become ill.  TREATMENT  Your caregiver will give you medication.  There are several kinds of anti monilial vaginal creams and suppositories specific for monilial vaginitis. For recurrent yeast infections, use a suppository or cream in the vagina 2 times a week, or as directed.   Anti-monilial or steroid cream for the itching or irritation of the vulva may also be used. Get your caregiver's permission.   Painting the vagina with methylene blue solution may help if the monilial cream does not work.   Eating yogurt may help prevent monilial vaginitis.  HOME CARE INSTRUCTIONS   Finish all medication as prescribed.   Do not have sex until treatment is completed or after your caregiver tells you it is okay.   Take warm sitz baths.   Do not douche.   Do not use tampons, especially scented ones.   Wear cotton underwear.   Avoid tight  pants and panty hose.   Tell your sexual partner that you have a yeast infection. They should go to their caregiver if they have symptoms such as mild rash or itching.   Your sexual partner should be treated as well if your infection is difficult to eliminate.   Practice safer sex. Use condoms.   Some vaginal medications cause latex condoms to fail. Vaginal medications that harm condoms are:   Cleocin cream.   Butoconazole (Femstat).   Terconazole (Terazol) vaginal suppository.   Miconazole (Monistat) (may be purchased over the counter).  SEEK MEDICAL CARE IF:   You have a temperature by mouth above 102 F (38.9 C).   The infection is getting worse after 2 days of treatment.   The infection is not getting better after 3 days of treatment.   You develop blisters in or around your vagina.   You develop vaginal bleeding, and it is not your menstrual period.   You have pain when you urinate.   You develop intestinal problems.   You have pain with sexual intercourse.  Document Released: 10/07/2004 Document Revised: 12/17/2010 Document Reviewed: 06/21/2008 [facility] Patient Information 2012 [place], [place].Constipation in Adults Constipation is having fewer than 2 bowel movements per week. Usually, the stools are hard. As we grow older, constipation is more common. If you try to fix constipation with laxatives, the problem may get worse. This is because laxatives taken over a long period of time make the colon muscles weaker. A low-fiber diet, not taking in enough fluids, and taking some medicines may  make these problems worse. MEDICATIONS THAT MAY CAUSE CONSTIPATION  Water pills (diuretics).   Calcium channel blockers (used to control blood pressure and for the heart).   Certain pain medicines (narcotics).   Anticholinergics.   Anti-inflammatory agents.   Antacids that contain aluminum.  DISEASES THAT CONTRIBUTE TO CONSTIPATION  Diabetes.   Parkinson's disease.    Dementia.   Stroke.   Depression.   Illnesses that cause problems with salt and water metabolism.  HOME CARE INSTRUCTIONS   Constipation is usually best cared for without medicines. Increasing dietary fiber and eating more fruits and vegetables is the best way to manage constipation.   Slowly increase fiber intake to 25 to 38 grams per day. Whole grains, fruits, vegetables, and legumes are good sources of fiber. A dietitian can further help you incorporate high-fiber foods into your diet.   Drink enough water and fluids to keep your urine clear or pale yellow.   A fiber supplement may be added to your diet if you cannot get enough fiber from foods.   Increasing your activities also helps improve regularity.   Suppositories, as suggested by your caregiver, will also help. If you are using antacids, such as aluminum or calcium containing products, it will be helpful to switch to products containing magnesium if your caregiver says it is okay.   If you have been given a liquid injection (enema) today, this is only a temporary measure. It should not be relied on for treatment of longstanding (chronic) constipation.   Stronger measures, such as magnesium sulfate, should be avoided if possible. This may cause uncontrollable diarrhea. Using magnesium sulfate may not allow you time to make it to the bathroom.  SEEK IMMEDIATE MEDICAL CARE IF:   There is bright red blood in the stool.   The constipation stays for more than 4 days.   There is belly (abdominal) or rectal pain.   You do not seem to be getting better.   You have any questions or concerns.  MAKE SURE YOU:   Understand these instructions.   Will watch your condition.   Will get help right away if you are not doing well or get worse.  Document Released: 09/26/2003 Document Revised: 12/17/2010 Document Reviewed: 12/01/2010 [facility] Patient Information 2012 [place], [place].

## 2011-07-09 DIAGNOSIS — B009 Herpesviral infection, unspecified: Secondary | ICD-10-CM | POA: Insufficient documentation

## 2011-07-09 MED ORDER — VALACYCLOVIR HCL 500 MG PO TABS: 500.0000 mg | ORAL_TABLET | ORAL | Status: DC | PRN

## 2011-07-20 ENCOUNTER — Encounter: Payer: 59 | Admitting: Family

## 2011-07-26 ENCOUNTER — Ambulatory Visit (INDEPENDENT_AMBULATORY_CARE_PROVIDER_SITE_OTHER): Payer: 59 | Admitting: Family

## 2011-07-26 ENCOUNTER — Encounter: Payer: Self-pay | Admitting: Family

## 2011-07-26 VITALS — BP 98/74 | HR 83 | Temp 98.1°F | Resp 16 | Ht 65.5 in | Wt 171.0 lb

## 2011-07-26 DIAGNOSIS — N643 Galactorrhea not associated with childbirth: Secondary | ICD-10-CM

## 2011-07-26 DIAGNOSIS — D649 Anemia, unspecified: Secondary | ICD-10-CM

## 2011-07-26 DIAGNOSIS — Z0000 Encounter for general adult medical examination without abnormal findings: Secondary | ICD-10-CM

## 2011-07-26 LAB — CBC WITH DIFFERENTIAL/PLATELET
Basophils Absolute: 0.1 10*3/uL (ref 0.0–0.1)
Eosinophils Relative: 2 % (ref 0–5)
HCT: 31.4 % — ABNORMAL LOW (ref 36.0–46.0)
Lymphocytes Relative: 30 % (ref 12–46)
Lymphs Abs: 2.2 10*3/uL (ref 0.7–4.0)
MCV: 79.7 fL (ref 78.0–100.0)
Monocytes Absolute: 0.7 10*3/uL (ref 0.1–1.0)
Neutro Abs: 4.2 10*3/uL (ref 1.7–7.7)
Platelets: 418 10*3/uL — ABNORMAL HIGH (ref 150–400)
RBC: 3.94 MIL/uL (ref 3.87–5.11)
WBC: 7.3 10*3/uL (ref 4.0–10.5)

## 2011-07-26 MED ORDER — ERGOCALCIFEROL 1.25 MG (50000 UT) PO CAPS: 50000.0000 [IU] | ORAL_CAPSULE | ORAL | Status: DC

## 2011-07-26 NOTE — Progress Notes (Signed)
Subjective:    Patient ID: Cheyenne Hawkins, female    DOB: 09-08-1975, 36 y.o.   MRN: [id number]  HPI  Pt presents today for her annual cpx.Last pap smear- per GYN (last pelvic 2 years). Diet is "decent."  She is exercising 2x a week.  She enjoys walking/gym, weights.   Galactorrhea- Pt reports that she has had persistent milky breast leakage from both breasts since the birth of her child 5 yrs ago.  She is able to express a small amount manually. She has reviewed this with her GYN- who reportedly performed a prolactin level which pt tells me is normal. GYN told her not to manipulate her breasts and to "leave them alone."   Review of Systems  Constitutional: Negative for fever.  HENT: Negative for hearing loss.   Eyes: Negative for visual disturbance.  Respiratory: Negative for shortness of breath.   Cardiovascular:       Reports hx of heart murmur. She has MVP- has seen Dr. Algie Coffer.  Had stress test reportedly neg.  Gastrointestinal: Positive for constipation.  Genitourinary: Negative for dysuria and menstrual problem.  Musculoskeletal: Positive for back pain.  Skin: Negative for rash.  Neurological: Negative for headaches.  Hematological: Negative for adenopathy.  Psychiatric/Behavioral:       Reports depression is controlled.   Past Medical History  Diagnosis Date  . History of chicken pox   . GERD (gastroesophageal reflux disease)   . Heart murmur   . Grave's disease 2002    s/p thyroidectomy  . Fatty liver 03/21/2011  . HSV-2 (herpes simplex virus 2) infection   . Depression 2012    History   Social History  . Marital Status: Married    Spouse Name: N/A    Number of Children: 3  . Years of Education: N/A   Occupational History  .  [employer]   Social History Main Topics  . Smoking status: Never Smoker   . Smokeless tobacco: Never Used  . Alcohol Use: 6.0 oz/week    10 Glasses of wine per week  . Drug Use: No  . Sexually Active: Yes    Birth Control/  Protection: Surgical     btl   Other Topics Concern  . Not on file   Social History Narrative   Regular exercise:  NoCaffeine Use: 2 drinks weekly    Past Surgical History  Procedure Date  . Thyroidectomy 2003    for graves disease  . Cesarean section 2001  . Cesarean section 2003  . Cesarean section 2007  . Tubal ligation     Family History  Problem Relation Age of Onset  . Alcohol abuse Mother   . Depression Mother   . Bipolar disorder Mother   . Alcohol abuse Father   . Diabetes Father   . Hypertension Father   . Cancer Maternal Grandmother     colon  . Depression Maternal Grandmother   . Diabetes Maternal Grandmother   . Hypertension Maternal Grandmother   . Cancer Paternal Grandmother     breast  . Diabetes Paternal Grandmother   . Hypertension Paternal Grandmother     No Known Allergies  Current Outpatient Prescriptions on File Prior to Visit  Medication Sig Dispense Refill  . levothyroxine (SYNTHROID, LEVOTHROID) 137 MCG tablet Take 137 mcg by mouth daily.      Marland Kitchen omeprazole (PRILOSEC) 40 MG capsule Take 1 capsule (40 mg total) by mouth daily.  30 capsule  5  . sertraline (ZOLOFT) 100 MG  tablet Take 1 tablet (100 mg total) by mouth daily.  30 tablet  5  . valACYclovir (VALTREX) 500 MG tablet Take 1 tablet (500 mg total) by mouth as needed.  30 tablet  12    BP 98/74  Pulse 83  Temp 98.1 F (36.7 C) (Oral)  Resp 16  Ht 5' 5.5" (1.664 m)  Wt 171 lb 0.6 oz (77.583 kg)  BMI 28.03 kg/m2  SpO2 99%  LMP 07/12/2011       Objective:   Physical Exam  Physical Exam  Constitutional: She is oriented to person, place, and time. She appears well-developed and well-nourished. No distress.  HENT:  Head: Normocephalic and atraumatic.  Right Ear: Tympanic membrane and ear canal normal.  Left Ear: Tympanic membrane and ear canal normal.  Mouth/Throat: Oropharynx is clear and moist.  Eyes: Pupils are equal, round, and reactive to light. No scleral icterus.    Neck: Normal range of motion. No thyromegaly present.  Cardiovascular: Normal rate and regular rhythm.   No murmur heard. Pulmonary/Chest: Effort normal and breath sounds normal. No respiratory distress. He has no wheezes. She has no rales. She exhibits no tenderness.  Abdominal: Soft. Bowel sounds are normal. He exhibits no distension and no mass. There is no tenderness. There is no rebound and no guarding.  Musculoskeletal: She exhibits no edema.  Lymphadenopathy:    She has no cervical adenopathy.  Neurological: She is alert and oriented to person, place, and time. She has normal reflexes. She exhibits normal muscle tone. Coordination normal.  Skin: Skin is warm and dry.  Psychiatric: She has a normal mood and affect. Her behavior is normal. Judgment and thought content normal.  Breasts: Examined lying Right: Without masses, retractions, or axillary adenopathy.  Left: Without masses, retractions, or axillary adenopathy. Pt demonstrates a small amount of milky discharge from the left breast.         Assessment & Plan:          Assessment & Plan:

## 2011-07-26 NOTE — Patient Instructions (Addendum)
Please go to lab today. Follow up in 3 months.

## 2011-07-27 ENCOUNTER — Telehealth: Payer: Self-pay | Admitting: Family

## 2011-07-27 DIAGNOSIS — D649 Anemia, unspecified: Secondary | ICD-10-CM

## 2011-07-27 NOTE — Telephone Encounter (Signed)
Notified pt. She states that sometimes her periods are heavy and other times not. Notes that the first day of her period is usually the heaviest.  Future lab order placed for the 2nd week of October. Copy given to the lab and mailed to the pt. Do you have a copy of diet for iron rich foods?

## 2011-07-27 NOTE — Telephone Encounter (Signed)
Left message for pt re: possibility of adding a 3 month OCP to decrease frequency and heaviness of her periods and hopefully help her anemia. Requested that she call us if she wishes to proceed with ocp.

## 2011-07-27 NOTE — Telephone Encounter (Signed)
Left message for pt to return our call.  Please let her know that her anemia is unchanged.  Hemoglobin is 9.9.  I would like her to work on iron rich foods in her diet and will mail her a copy of some recommended foods.  Since she is unable to tolerate iron supplement, I would like her to try a multivitamin with iron (lower dose) once daily and see if she can tolerate this. We will plan to repeat CBC in 3 months.  Also, pls ask her if her periods are heavy.

## 2011-08-02 DIAGNOSIS — Z0000 Encounter for general adult medical examination without abnormal findings: Secondary | ICD-10-CM | POA: Insufficient documentation

## 2011-08-02 DIAGNOSIS — N643 Galactorrhea not associated with childbirth: Secondary | ICD-10-CM | POA: Insufficient documentation

## 2011-08-02 NOTE — Assessment & Plan Note (Signed)
Will request prolactin level from gyn.

## 2011-08-02 NOTE — Assessment & Plan Note (Addendum)
Pt counseled on diet, exercise, and weight loss.  Per pt, tetanus is up to date.  She will schedule pap smear with GYN.

## 2011-08-04 ENCOUNTER — Telehealth: Payer: Self-pay | Admitting: *Deleted

## 2011-08-04 DIAGNOSIS — E559 Vitamin D deficiency, unspecified: Secondary | ICD-10-CM

## 2011-08-04 DIAGNOSIS — D649 Anemia, unspecified: Secondary | ICD-10-CM

## 2011-08-04 NOTE — Telephone Encounter (Signed)
Message copied by Kathi Simpers on Wed Aug 04, 2011  3:18 PM ------      Message from: O'SULLIVAN, MELISSA      Created: Mon Jul 26, 2011  3:11 PM       Pls send vitamin D level to lab for 3 months- diagnosis is vitamin d deficiency

## 2011-08-04 NOTE — Telephone Encounter (Signed)
Future order placed and copy given to the lab. 

## 2011-08-13 ENCOUNTER — Encounter: Payer: 59 | Admitting: Family

## 2011-09-03 ENCOUNTER — Ambulatory Visit (INDEPENDENT_AMBULATORY_CARE_PROVIDER_SITE_OTHER): Payer: 59 | Admitting: Family

## 2011-09-03 ENCOUNTER — Encounter: Payer: Self-pay | Admitting: Family

## 2011-09-03 VITALS — BP 110/74 | HR 80 | Temp 98.9°F | Ht 65.0 in | Wt 172.8 lb

## 2011-09-03 DIAGNOSIS — M940 Chondrocostal junction syndrome [Tietze]: Secondary | ICD-10-CM

## 2011-09-03 DIAGNOSIS — M778 Other enthesopathies, not elsewhere classified: Secondary | ICD-10-CM

## 2011-09-03 DIAGNOSIS — Z862 Personal history of diseases of the blood and blood-forming organs and certain disorders involving the immune mechanism: Secondary | ICD-10-CM

## 2011-09-03 DIAGNOSIS — K589 Irritable bowel syndrome without diarrhea: Secondary | ICD-10-CM

## 2011-09-03 DIAGNOSIS — M6580 Other synovitis and tenosynovitis, unspecified site: Secondary | ICD-10-CM

## 2011-09-03 MED ORDER — MELOXICAM 7.5 MG PO TABS: 7.5000 mg | ORAL_TABLET | Freq: Every day | ORAL | Status: DC

## 2011-09-03 MED ORDER — CALCIUM CARBONATE-VITAMIN D 600-400 MG-UNIT PO TABS: 1.0000 | ORAL_TABLET | Freq: Every day | ORAL | Status: DC

## 2011-09-03 NOTE — Assessment & Plan Note (Signed)
R anterior chest wall. Trial of meloxicam.

## 2011-09-03 NOTE — Assessment & Plan Note (Signed)
We talked about a 3 month OCP to reduce bleeding.  She does not wish to add anther medicine.  A mirena IUD would be another consideration and I encouraged her to talk with Dr. Pennie Rushing her GYN about this.  In the meantime, she will continue a high iron diet and add a multivitiamin with minerals.

## 2011-09-03 NOTE — Assessment & Plan Note (Signed)
I recommended ice prn as well as short course of meloxicam.

## 2011-09-03 NOTE — Progress Notes (Signed)
Subjective:    Patient ID: Cheyenne Hawkins, female    DOB: [birth year], 36 y.o.   MRN: [id number]  HPI  [name] is a 36 yr old female who presents today with several concerns.  Reports some cramping/pain in right foot- several years. Has been told that she has arthritis in her feet based on x-ray in the past.   R elbow pain 2-3 weeks.  Lifted mattresses. 1 month ago.  She denies swelling.  Worse with lifting.  Took few doses of ibuprofen.    RUQ pain-  Worse with movement.  Not worse after meals.  No nausea/vomitting.   Anemia- she reports heavy periods.  Last check hgb was 9.9.  She is unable to tolerate iron supplement due to constipation.     Constipation- continues to be a problem.  She admits that she has not been using amitiza regularly.  Review of Systems     Past Medical History  Diagnosis Date  . History of chicken pox   . GERD (gastroesophageal reflux disease)   . Heart murmur   . Grave's disease 2002    s/p thyroidectomy  . Fatty liver 03/21/2011  . HSV-2 (herpes simplex virus 2) infection   . Depression 2012    History   Social History  . Marital Status: Married    Spouse Name: N/A    Number of Children: 3  . Years of Education: N/A   Occupational History  .  [employer]   Social History Main Topics  . Smoking status: Never Smoker   . Smokeless tobacco: Never Used  . Alcohol Use: 6.0 oz/week    10 Glasses of wine per week  . Drug Use: No  . Sexually Active: Yes    Birth Control/ Protection: Surgical     btl   Other Topics Concern  . Not on file   Social History Narrative   Regular exercise:  NoCaffeine Use: 2 drinks weekly    Past Surgical History  Procedure Date  . Thyroidectomy 2003    for graves disease  . Cesarean section 2001  . Cesarean section 2003  . Cesarean section 2007  . Tubal ligation     Family History  Problem Relation Age of Onset  . Alcohol abuse Mother   . Depression Mother   . Bipolar disorder Mother   .  Alcohol abuse Father   . Diabetes Father   . Hypertension Father   . Cancer Maternal Grandmother     colon  . Depression Maternal Grandmother   . Diabetes Maternal Grandmother   . Hypertension Maternal Grandmother   . Cancer Paternal Grandmother     breast  . Diabetes Paternal Grandmother   . Hypertension Paternal Grandmother     No Known Allergies  Current Outpatient Prescriptions on File Prior to Visit  Medication Sig Dispense Refill  . ergocalciferol (VITAMIN D2) 50000 UNITS capsule Take 1 capsule (50,000 Units total) by mouth once a week.  12 capsule  0  . levothyroxine (SYNTHROID, LEVOTHROID) 137 MCG tablet Take 137 mcg by mouth daily.      . Multiple Vitamins-Minerals (MULTIVITAMIN WITH MINERALS) tablet Take 1 tablet by mouth daily.      Marland Kitchen omeprazole (PRILOSEC) 40 MG capsule Take 1 capsule (40 mg total) by mouth daily.  30 capsule  5  . sertraline (ZOLOFT) 100 MG tablet Take 1 tablet (100 mg total) by mouth daily.  30 tablet  5  . valACYclovir (VALTREX) 500 MG tablet Take  1 tablet (500 mg total) by mouth as needed.  30 tablet  12    BP 110/74  Pulse 80  Temp 98.9 F (37.2 C) (Oral)  Ht 5\' 5"  (1.651 m)  Wt 172 lb 12.8 oz (78.382 kg)  BMI 28.76 kg/m2  SpO2 98%    Objective:   Physical Exam  Constitutional: She appears well-developed and well-nourished.  Abdominal: Soft. Bowel sounds are normal. She exhibits no distension and no mass. There is no tenderness. There is no rebound and no guarding.  Musculoskeletal:       R anterior chest wall tenderness to palpation beneath right breast.  R Elbow- full ROM. No swelling or warmth R wrist- full ROM, no swelling or warmth.  Skin: Skin is warm and dry.  Psychiatric: She has a normal mood and affect. Her behavior is normal. Judgment and thought content normal.          Assessment & Plan:

## 2011-09-03 NOTE — Assessment & Plan Note (Signed)
I have advised her to start taking amitiza regularly.

## 2011-09-03 NOTE — Patient Instructions (Addendum)
Call if symptoms worsen or if no improvement in 1-2 weeks.  Follow up in 2 months for lab work.  Please schedule a follow up appointment in 3 months.

## 2011-09-06 ENCOUNTER — Ambulatory Visit: Payer: 59 | Admitting: Family

## 2011-10-31 ENCOUNTER — Other Ambulatory Visit: Payer: Self-pay | Admitting: Family

## 2011-11-01 NOTE — Telephone Encounter (Signed)
Vitamin D request denied as pt is due for vitamin d level to determine if she will need to continue the Rx. Left detailed message on pt's cell# that she is due for lab draw this week. Lab hours provided and advised pt to call if she has any questions.  Current request denied until pt returns for bloodwork.

## 2011-11-05 ENCOUNTER — Ambulatory Visit (INDEPENDENT_AMBULATORY_CARE_PROVIDER_SITE_OTHER): Payer: 59 | Admitting: Family

## 2011-11-05 ENCOUNTER — Encounter: Payer: Self-pay | Admitting: Family

## 2011-11-05 VITALS — BP 100/70 | HR 76 | Temp 98.5°F | Resp 16 | Ht 65.0 in | Wt 173.1 lb

## 2011-11-05 DIAGNOSIS — N760 Acute vaginitis: Secondary | ICD-10-CM | POA: Insufficient documentation

## 2011-11-05 DIAGNOSIS — IMO0002 Reserved for concepts with insufficient information to code with codable children: Secondary | ICD-10-CM

## 2011-11-05 DIAGNOSIS — F424 Excoriation (skin-picking) disorder: Secondary | ICD-10-CM

## 2011-11-05 DIAGNOSIS — N898 Other specified noninflammatory disorders of vagina: Secondary | ICD-10-CM

## 2011-11-05 LAB — CBC WITH DIFFERENTIAL/PLATELET
Basophils Absolute: 0 10*3/uL (ref 0.0–0.1)
Lymphocytes Relative: 31 % (ref 12–46)
Lymphs Abs: 1.5 10*3/uL (ref 0.7–4.0)
Neutro Abs: 2.8 10*3/uL (ref 1.7–7.7)
Neutrophils Relative %: 56 % (ref 43–77)
Platelets: 333 10*3/uL (ref 150–400)
RBC: 4.09 MIL/uL (ref 3.87–5.11)
RDW: 17.1 % — ABNORMAL HIGH (ref 11.5–15.5)
WBC: 4.9 10*3/uL (ref 4.0–10.5)

## 2011-11-05 MED ORDER — ESCITALOPRAM OXALATE 20 MG PO TABS: 20.0000 mg | ORAL_TABLET | Freq: Every day | ORAL | Status: DC

## 2011-11-05 MED ORDER — FLUCONAZOLE 150 MG PO TABS: ORAL_TABLET | ORAL | Status: DC

## 2011-11-05 MED ORDER — PANTOPRAZOLE SODIUM 40 MG PO TBEC: 40.0000 mg | DELAYED_RELEASE_TABLET | Freq: Every day | ORAL | Status: DC

## 2011-11-05 NOTE — Patient Instructions (Signed)
Please stop zoloft and omeprazole. You in place start lexapro and protonix. Follow up in 6 weeks, sooner if problems/conerns.

## 2011-11-05 NOTE — Assessment & Plan Note (Addendum)
Will try switching zoloft to lexapro to see if this helps. We discussed referral to a therapist but she declines at this time. 25 minutes spent with pt today.  >50% of this time was spent counseling pt on her skin picking.

## 2011-11-05 NOTE — Telephone Encounter (Signed)
Pt presented to the lab, future orders released. 

## 2011-11-05 NOTE — Addendum Note (Signed)
Addended by: Mervin Kung A on: 11/05/2011 09:30 AM   Modules accepted: Orders

## 2011-11-05 NOTE — Assessment & Plan Note (Signed)
Will plan to rx empirically with diflucan. Perhaps this may help her tongue discomfort as well if she has an early thrush.  Await wet prep results.

## 2011-11-05 NOTE — Progress Notes (Signed)
Subjective:    Patient ID: Cheyenne Hawkins, female    DOB: [birth year], 36 y.o.   MRN: [id number]  HPI  [name] is a 36 yr old female who presents today with several concerns.  Mouth dryness- tongue "does not feel normal."  Mouth dryness.    Vaginal itching,vaginal discharge. Intermittent  Picking- reports that she had some insect bites from over the summer.  She tells me that when she is home at night studying she picks at these bites. They are as a result non-healing. Spoke to dermatology who recommended referral to psychiatry.   Review of Systems See HPI  Past Medical History  Diagnosis Date  . History of chicken pox   . GERD (gastroesophageal reflux disease)   . Heart murmur   . Grave's disease 2002    s/p thyroidectomy  . Fatty liver 03/21/2011  . HSV-2 (herpes simplex virus 2) infection   . Depression 2012    History   Social History  . Marital Status: Married    Spouse Name: N/A    Number of Children: 3  . Years of Education: N/A   Occupational History  .  [employer]   Social History Main Topics  . Smoking status: Never Smoker   . Smokeless tobacco: Never Used  . Alcohol Use: 6.0 oz/week    10 Glasses of wine per week  . Drug Use: No  . Sexually Active: Yes    Birth Control/ Protection: Surgical     btl   Other Topics Concern  . Not on file   Social History Narrative   Regular exercise:  NoCaffeine Use: 2 drinks weekly    Past Surgical History  Procedure Date  . Thyroidectomy 2003    for graves disease  . Cesarean section 2001  . Cesarean section 2003  . Cesarean section 2007  . Tubal ligation     Family History  Problem Relation Age of Onset  . Alcohol abuse Mother   . Depression Mother   . Bipolar disorder Mother   . Alcohol abuse Father   . Diabetes Father   . Hypertension Father   . Cancer Maternal Grandmother     colon  . Depression Maternal Grandmother   . Diabetes Maternal Grandmother   . Hypertension Maternal  Grandmother   . Cancer Paternal Grandmother     breast  . Diabetes Paternal Grandmother   . Hypertension Paternal Grandmother     No Known Allergies  Current Outpatient Prescriptions on File Prior to Visit  Medication Sig Dispense Refill  . Calcium Carbonate-Vitamin D (CALTRATE 600+D) 600-400 MG-UNIT per tablet Take 1 tablet by mouth daily.      . ergocalciferol (VITAMIN D2) 50000 UNITS capsule Take 1 capsule (50,000 Units total) by mouth once a week.  12 capsule  0  . levothyroxine (SYNTHROID, LEVOTHROID) 137 MCG tablet Take 137 mcg by mouth daily.      Marland Kitchen lubiprostone (AMITIZA) 8 MCG capsule Take 8 mcg by mouth 2 (two) times daily with a meal.      . meloxicam (MOBIC) 7.5 MG tablet Take 1 tablet (7.5 mg total) by mouth daily.  14 tablet  0  . Multiple Vitamins-Minerals (MULTIVITAMIN WITH MINERALS) tablet Take 1 tablet by mouth daily.      . valACYclovir (VALTREX) 500 MG tablet Take 1 tablet (500 mg total) by mouth as needed.  30 tablet  12  . escitalopram (LEXAPRO) 20 MG tablet Take 1 tablet (20 mg total) by mouth daily.  30 tablet  2  . pantoprazole (PROTONIX) 40 MG tablet Take 1 tablet (40 mg total) by mouth daily.  30 tablet  3    BP 100/70  Pulse 76  Temp 98.5 F (36.9 C) (Oral)  Resp 16  Ht 5\' 5"  (1.651 m)  Wt 173 lb 1.3 oz (78.509 kg)  BMI 28.80 kg/m2  SpO2 99%  LMP 10/15/2011       Objective:   Physical Exam  Constitutional: She appears well-developed and well-nourished. No distress.  HENT:       Tongue appears normal.  Cardiovascular: Normal rate and regular rhythm.   No murmur heard. Pulmonary/Chest: Effort normal and breath sounds normal. No respiratory distress. She has no wheezes. She has no rales. She exhibits no tenderness.  Genitourinary: Vagina normal.       Wet prep performed.  Skin:       Multiple shallow ulcerated lesions <1cm in diameter which are noted on bilateral arms.          Assessment & Plan:

## 2011-11-06 LAB — VITAMIN D 25 HYDROXY (VIT D DEFICIENCY, FRACTURES): Vit D, 25-Hydroxy: 29 ng/mL — ABNORMAL LOW (ref 30–89)

## 2011-11-08 ENCOUNTER — Telehealth: Payer: Self-pay | Admitting: Family

## 2011-11-08 DIAGNOSIS — D509 Iron deficiency anemia, unspecified: Secondary | ICD-10-CM

## 2011-11-08 MED ORDER — METRONIDAZOLE 500 MG PO TABS: 500.0000 mg | ORAL_TABLET | Freq: Three times a day (TID) | ORAL | Status: DC

## 2011-11-08 NOTE — Telephone Encounter (Signed)
Pls call pt and let her know that her wet prep shows bacterial vaginosis. Rx sent for flagyl. She should avoid alcohol while taking flagyl.

## 2011-11-08 NOTE — Telephone Encounter (Addendum)
Rx should read one tab bid x 7 days #14. Thanks  Also, she is anemic.  Hbg is 9.8. I know that she is unable to tolerate oral iron due to constipation.  I would like to set her up with Dr. Myna Hidalgo to be considered for IV iron infusions (pended below). She should also plan follow up with Dr. Pennie Rushing to discuss heavy menstrual bleeding.

## 2011-11-08 NOTE — Telephone Encounter (Signed)
Notified pt. Current flagyl rx is for #14 tablets and directions are 1 tablet three times daily. Is quantity correct?

## 2011-11-09 NOTE — Telephone Encounter (Signed)
Left message on cell# to call re: additional instructions below.

## 2011-11-09 NOTE — Telephone Encounter (Signed)
Notified pt of below instructions and she voices understanding. Pt is agreeable to proceed with referral and will let us know if she has not heard from Dr Gustavo Lah office re: appt within 1 week.

## 2011-11-09 NOTE — Telephone Encounter (Signed)
Patient returned phone call. Best # [phone]

## 2011-11-18 ENCOUNTER — Other Ambulatory Visit: Payer: 59 | Admitting: Lab

## 2011-11-18 ENCOUNTER — Ambulatory Visit: Payer: 59

## 2011-11-18 ENCOUNTER — Other Ambulatory Visit (HOSPITAL_BASED_OUTPATIENT_CLINIC_OR_DEPARTMENT_OTHER): Payer: 59 | Admitting: Lab

## 2011-11-18 ENCOUNTER — Ambulatory Visit (HOSPITAL_BASED_OUTPATIENT_CLINIC_OR_DEPARTMENT_OTHER): Payer: 59 | Admitting: Medical

## 2011-11-18 VITALS — BP 110/70 | HR 85 | Temp 97.9°F | Resp 16 | Ht 65.0 in | Wt 173.0 lb

## 2011-11-18 DIAGNOSIS — K5900 Constipation, unspecified: Secondary | ICD-10-CM

## 2011-11-18 DIAGNOSIS — E559 Vitamin D deficiency, unspecified: Secondary | ICD-10-CM

## 2011-11-18 DIAGNOSIS — Z862 Personal history of diseases of the blood and blood-forming organs and certain disorders involving the immune mechanism: Secondary | ICD-10-CM

## 2011-11-18 DIAGNOSIS — D509 Iron deficiency anemia, unspecified: Secondary | ICD-10-CM

## 2011-11-18 LAB — CBC WITH DIFFERENTIAL (CANCER CENTER ONLY)
BASO%: 0.5 % (ref 0.0–2.0)
EOS%: 1 % (ref 0.0–7.0)
HCT: 29.1 % — ABNORMAL LOW (ref 34.8–46.6)
LYMPH%: 26 % (ref 14.0–48.0)
MCHC: 31.3 g/dL — ABNORMAL LOW (ref 32.0–36.0)
MCV: 77 fL — ABNORMAL LOW (ref 81–101)
MONO%: 10.8 % (ref 0.0–13.0)
NEUT%: 61.7 % (ref 39.6–80.0)
RDW: 16.3 % — ABNORMAL HIGH (ref 11.1–15.7)

## 2011-11-18 LAB — COMPREHENSIVE METABOLIC PANEL
AST: 22 U/L (ref 0–37)
Alkaline Phosphatase: 65 U/L (ref 39–117)
BUN: 11 mg/dL (ref 6–23)
Creatinine, Ser: 0.6 mg/dL (ref 0.50–1.10)
Total Bilirubin: 0.4 mg/dL (ref 0.3–1.2)

## 2011-11-18 LAB — FERRITIN: Ferritin: 7 ng/mL — ABNORMAL LOW (ref 10–291)

## 2011-11-18 LAB — CHCC SATELLITE - SMEAR

## 2011-11-18 LAB — IRON AND TIBC
TIBC: 368 ug/dL (ref 250–470)
UIBC: 315 ug/dL (ref 125–400)

## 2011-11-18 LAB — RETICULOCYTES (CHCC): ABS Retic: 54.6 10*3/uL (ref 19.0–186.0)

## 2011-11-18 NOTE — Progress Notes (Signed)
This office note has been dictated.

## 2011-11-18 NOTE — H&P (Signed)
[facility] NEW PATIENT EVALUATION   Name: Cheyenne Hawkins Date: 11/18/2011 MRN: [id number] DOB: [birth year]   REFERRING PHYSICIAN: Sandford Craze, NP  REASON FOR REFERRAL: Iron deficiency anemia   HISTORY OF PRESENT ILLNESS: Cheyenne Hawkins is a 36 y.o. female who is was kindly referred to [place] for evaluation of iron deficiency anemia.  Cheyenne Hawkins, reports, that since she was in high school.  She was told that she had iron deficiency anemia.  She does have a long term.  history of menometrorrhagia.  She also reports, that she constantly craves ice.  She states, that her sister also has a history of iron deficiency anemia.  Cheyenne Hawkins, reports, that in the past she has been informed to take oral iron supplementation, however, due to her severe constipation she is opposed to taking the oral iron.  She does report intermittent excessive fatigue.  When she was seen by primary care on 11/05/2011, her hemoglobin was 9.8, hematocrit 30.3, MCV 74.  Looking back at her lab work, she did have an iron panel back on 06/15/2011.  At that time.  Her.  Iron was 56, with 13% saturation.  Her ferritin level was 4.  Her folate was normal at 12.8, her vitamin B12 level was normal at 466, her vitamin D was low at 13.  She did have fecal occult blood test that was negative.  Her lab work from today revealed a hemoglobin of 9.1, hematocrit 29.1, MCV 77.  Her iron panel is pending.  She denies any abdominal pain.  She denies any obvious, or abnormal bleeding.  She reports, that she has a good appetite.  She denies any nausea, vomiting, or diarrhea.  She denies any cough, chest pain, or shortness of breath.  She denies any fevers, chills, or night sweats.  She denies any headaches, visual changes, or rashes.  Of note, she does have what is termed "skin, picking disorder."  Apparently, this is from anxiety, and stress.  She does see a dermatologist, and is also on Lexapro.  It appears, that IV iron, would be  the best option for [name], especially given the issue with constipation.  PAST MEDICAL HISTORY:  1. chicken pox 2. GERD (gastroesophageal reflux disease) 3. Heart murmur; Grave's disease (2002) 4.Fatty liver (03/21/2011) 5.HSV-2 (herpes simplex virus 2) infection 6. Depression (2012).   7.Iron deficiency anemia 8.Skin picking disorder  PAST SURGICAL HISTORY: Past Surgical History  Procedure Date  . Thyroidectomy 2003    for graves disease  . Cesarean section 2001  . Cesarean section 2003  . Cesarean section 2007  . Tubal ligation     Current Outpatient Prescriptions on File Prior to Visit  Medication Sig Dispense Refill  . ergocalciferol (VITAMIN D2) 50000 UNITS capsule Take 1 capsule (50,000 Units total) by mouth once a week.  12 capsule  0  . escitalopram (LEXAPRO) 20 MG tablet Take 1 tablet (20 mg total) by mouth daily.  30 tablet  2  . levothyroxine (SYNTHROID, LEVOTHROID) 137 MCG tablet Take 137 mcg by mouth daily.      . metroNIDAZOLE (FLAGYL) 500 MG tablet Take 1 tablet (500 mg total) by mouth 3 (three) times daily.  14 tablet  0  . Multiple Vitamins-Minerals (MULTIVITAMIN WITH MINERALS) tablet Take 1 tablet by mouth daily.      . pantoprazole (PROTONIX) 40 MG tablet Take 1 tablet (40 mg total) by mouth daily.  30 tablet  3  . valACYclovir (VALTREX) 500 MG tablet  Take 1 tablet (500 mg total) by mouth as needed.  30 tablet  12  . lubiprostone (AMITIZA) 8 MCG capsule Take 8 mcg by mouth 2 (two) times daily with a meal. ONLY TAKES WHEN SHE CAN AFFORD THEM        ALLERGIES: NKDA  SOCIAL HISTORY:  reports that she has never smoked. She has never used smokeless tobacco. She reports that she drinks about 6 ounces of alcohol per week. She reports that she does not use illicit drugs. She is married with 3 children.  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 She is in school.  FAMILY HISTORY: family history includes Alcohol abuse in her father and mother; Bipolar disorder in  her mother; Cancer in her maternal grandmother and paternal grandmother; Depression in her maternal grandmother and mother; Diabetes in her father, maternal grandmother, and paternal grandmother; and Hypertension in her father, maternal grandmother, and paternal grandmother.  Laboratory Data:   White count 5.7, hemoglobin 9.1, hematocrit 29.1, MCV 77, platelets 334,000 B12, and iron panel are pending from today.  Peripheral smear  shows a well defined population of red blood cells. There is no  polychromasia. I do not see any nucleated red blood cells. There are  no schistocytes. She had no rouleaux formation. I saw no target cells.  There are no sickle cells. Her white cells appear normal in morphology  and maturation. She has no hypersegmented polys. There are no immature  myeloid or lymphoid forms.  I do not see any blasts. Platelets were well-  Granulated and normal size.  Review of Systems: Constitutional:Negative for malaise/fatigue, fever, chills, weight loss, diaphoresis, activity change, appetite change, and unexpected weight change.  HEENT: Negative for double vision, blurred vision, visual loss, ear pain, tinnitus, congestion, rhinorrhea, epistaxis sore throat or sinus disease, oral pain/lesion, tongue soreness Respiratory: Negative for cough, chest tightness, shortness of breath, wheezing and stridor.  Cardiovascular: Negative for chest pain, palpitations, leg swelling, orthopnea, PND, DOE or claudication Gastrointestinal: Negative for nausea, vomiting, abdominal pain, diarrhea, constipation, blood in stool, melena, hematochezia, abdominal distention, anal bleeding, rectal pain, anorexia and hematemesis.  Genitourinary: Negative for dysuria, frequency, hematuria,  Musculoskeletal: Negative for myalgias, back pain, joint swelling, arthralgias and gait problem.  Skin: Negative for rash, color change, pallor and wound. She does have some shallow ulcerated lesions (less than 1 cm in  diameter) bilaterally on arms and legs. Neurological:. Negative for dizziness/light-headedness, tremors, seizures, syncope, facial asymmetry, speech difficulty, weakness, numbness, headaches and paresthesias.  Hematological: Negative for adenopathy. Does not bruise/bleed easily.  Psychiatric/Behavioral:  Negative for depression, no loss of interest in normal activity or change in sleep pattern.   Physical Exam: This is a pleasant, 36 year old, well-developed, well-nourished, African American female, in no obvious distress Vitals: Temperature 97.9 degrees, pulse 85, respirations 16, blood pressure 110/70, weight 173 pounds HEENT reveals a normocephalic, atraumatic skull, no scleral icterus, no oral lesions  Neck is supple without any cervical or supraclavicular adenopathy.  Lungs are clear to auscultation bilaterally. There are no wheezes, rales or rhonci Cardiac is regular rate and rhythm with a normal S1 and S2. There are no murmurs, rubs, or bruits.  Abdomen is soft with good bowel sounds, there is no palpable mass. There is no palpable hepatosplenomegaly. There is no palpable fluid wave.  Musculoskeletal no tenderness of the spine, ribs, or hips.  Extremities there are no clubbing, cyanosis, or edema.  Skin no petechia, purpura or ecchymosis Neurologic is nonfocal.   Assessment/Plan: This  is a pleasant, 36 year old, female, with the following issues:  #1 .  Iron deficiency anemia.  She is clearly iron deficient.  On previous lab work.  Her iron panel from today is pending, as well as B12.  Due to her severe, constipation issues she is opposed to trying oral iron supplementation.  Her peripheral smear did not reveal any abnormalities.  There is no signs or symptoms for any type of leukemia or lymphoma.  There is no indication of any myelodysplastic syndrome.  She is on a PPI and there is some correlation with PPIs and decrease in iron absorption.  Overall, I think that Ms. Siragusa has been iron  deficient.  For quite some time.  We will go ahead and get her set up to have IV iron with Feraheme, 1,020mg .  I did review the risks of IV Feraheme with the patient to include, but not limited to: Diarrhea, nausea, dizziness, hypertension, constipation, peripheral edema, and hypersensitivity reactions including anaphylaxis.  She voiced her understanding and we will go ahead and set her up to have IV iron.  #2 vitamin D deficiency.  She is on vitamin D2 50,000 units one capsule by mouth once weekly.  #3 history of Graves' disease.  She is status post thyroidectomy.  She is on Synthroid.  #4.  Constipation.  She is on Amitiza.  #5.  Followup.  Ms. Gregg will follow back up with [place] in 6 weeks, but before then should there be questions or concerns.

## 2011-11-19 ENCOUNTER — Other Ambulatory Visit: Payer: Self-pay | Admitting: Medical

## 2011-11-19 ENCOUNTER — Ambulatory Visit (HOSPITAL_BASED_OUTPATIENT_CLINIC_OR_DEPARTMENT_OTHER): Payer: 59

## 2011-11-19 VITALS — BP 103/63 | HR 87 | Temp 97.6°F | Resp 20

## 2011-11-19 DIAGNOSIS — D509 Iron deficiency anemia, unspecified: Secondary | ICD-10-CM

## 2011-11-19 HISTORY — DX: Iron deficiency anemia, unspecified: D50.9

## 2011-11-19 MED ORDER — SODIUM CHLORIDE 0.9 % IV SOLN
Freq: Once | INTRAVENOUS | Status: AC
  Administered 2011-11-19: 15:00:00 via INTRAVENOUS

## 2011-11-19 MED ORDER — SODIUM CHLORIDE 0.9 % IV SOLN
1020.0000 mg | Freq: Once | INTRAVENOUS | Status: DC
  Administered 2011-11-19: 1020 mg via INTRAVENOUS
  Filled 2011-11-19: qty 34

## 2011-11-19 NOTE — Patient Instructions (Signed)
Ferumoxytol injection What is this medicine? FERUMOXYTOL is an iron complex. Iron is used to make healthy red blood cells, which carry oxygen and nutrients throughout the body. This medicine is used to treat iron deficiency anemia in people with chronic kidney disease. This medicine may be used for other purposes; ask your health care provider or pharmacist if you have questions. What should I tell my health care provider before I take this medicine? They need to know if you have any of these conditions: -anemia not caused by low iron levels -high levels of iron in the blood -magnetic resonance imaging (MRI) test scheduled -an unusual or allergic reaction to iron, other medicines, foods, dyes, or preservatives -pregnant or trying to get pregnant -breast-feeding How should I use this medicine? This medicine is for infusion into a vein. It is given by a health care professional in a hospital or clinic setting. Talk to your pediatrician regarding the use of this medicine in children. Special care may be needed. Overdosage: If you think you've taken too much of this medicine contact a poison control center or emergency room at once. Overdosage: If you think you have taken too much of this medicine contact a poison control center or emergency room at once. NOTE: This medicine is only for you. Do not share this medicine with others. What if I miss a dose? It is important not to miss your dose. Call your doctor or health care professional if you are unable to keep an appointment. What may interact with this medicine? This medicine may interact with the following medications: -other iron products This list may not describe all possible interactions. Give your health care provider a list of all the medicines, herbs, non-prescription drugs, or dietary supplements you use. Also tell them if you smoke, drink alcohol, or use illegal drugs. Some items may interact with your medicine. What should I watch  for while using this medicine? Visit your doctor or healthcare professional regularly. Tell your doctor or healthcare professional if your symptoms do not start to get better or if they get worse. You may need blood work done while you are taking this medicine. You may need to follow a special diet. Talk to your doctor. Foods that contain iron include: whole grains/cereals, dried fruits, beans, or peas, leafy green vegetables, and organ meats (liver, kidney). What side effects may I notice from receiving this medicine? Side effects that you should report to your doctor or health care professional as soon as possible: -allergic reactions like skin rash, itching or hives, swelling of the face, lips, or tongue -breathing problems -changes in blood pressure -feeling faint or lightheaded, falls -fever or chills -flushing, sweating, or hot feelings -swelling of the ankles or feet Side effects that usually do not require medical attention (Report these to your doctor or health care professional if they continue or are bothersome.): -diarrhea -headache -nausea, vomiting -stomach pain This list may not describe all possible side effects. Call your doctor for medical advice about side effects. You may report side effects to FDA at 1-800-FDA-1088. Where should I keep my medicine? This drug is given in a hospital or clinic and will not be stored at home. NOTE: This sheet is a summary. It may not cover all possible information. If you have questions about this medicine, talk to your doctor, pharmacist, or health care provider.  2012, Elsevier/Gold Standard. (09/20/2007 9:48:25 PM) 

## 2011-12-03 ENCOUNTER — Ambulatory Visit: Payer: 59 | Admitting: Family

## 2011-12-17 ENCOUNTER — Encounter: Payer: Self-pay | Admitting: Family

## 2011-12-17 ENCOUNTER — Ambulatory Visit (INDEPENDENT_AMBULATORY_CARE_PROVIDER_SITE_OTHER): Payer: 59 | Admitting: Family

## 2011-12-17 VITALS — BP 100/70 | HR 84 | Temp 98.7°F | Resp 16 | Ht 65.0 in | Wt 175.0 lb

## 2011-12-17 DIAGNOSIS — IMO0002 Reserved for concepts with insufficient information to code with codable children: Secondary | ICD-10-CM

## 2011-12-17 DIAGNOSIS — K589 Irritable bowel syndrome without diarrhea: Secondary | ICD-10-CM

## 2011-12-17 DIAGNOSIS — F424 Excoriation (skin-picking) disorder: Secondary | ICD-10-CM

## 2011-12-17 DIAGNOSIS — D509 Iron deficiency anemia, unspecified: Secondary | ICD-10-CM

## 2011-12-17 DIAGNOSIS — F341 Dysthymic disorder: Secondary | ICD-10-CM

## 2011-12-17 DIAGNOSIS — F418 Other specified anxiety disorders: Secondary | ICD-10-CM

## 2011-12-17 MED ORDER — ESCITALOPRAM OXALATE 20 MG PO TABS: 20.0000 mg | ORAL_TABLET | Freq: Every day | ORAL | Status: DC

## 2011-12-17 NOTE — Progress Notes (Signed)
Subjective:    Patient ID: Cheyenne Hawkins, female    DOB: [birth year], 36 y.o.   MRN: [id number]  HPI  Cheyenne Hawkins is a 36 yr old female who presents today for follow up.  1) Anemia- now following with Dr. Myna Hidalgo.  She had a infusion of IV iron.  Energy is OK.    2) Depression/anxiety- currently on lexapro 20 mg.  This was changed due to her skin picking habit.  She reports feeling happier on the lexapro. Still some skin picking but it is better than it was.  3) IBS- she continues amitiza- using dulcolax  Sometimes instead due to cost. She plans to refill her amitiza after the new year when her flex spend account renews.   Review of Systems See HPI  Past Medical History  Diagnosis Date  . History of chicken pox   . GERD (gastroesophageal reflux disease)   . Heart murmur   . Grave's disease 2002    s/p thyroidectomy  . Fatty liver 03/21/2011  . HSV-2 (herpes simplex virus 2) infection   . Depression 2012  . Anemia, iron deficiency 11/19/2011    History   Social History  . Marital Status: Married    Spouse Name: N/A    Number of Children: 3  . Years of Education: N/A   Occupational History  .  [employer]   Social History Main Topics  . Smoking status: Never Smoker   . Smokeless tobacco: Never Used  . Alcohol Use: 6.0 oz/week    10 Glasses of wine per week  . Drug Use: No  . Sexually Active: Yes    Birth Control/ Protection: Surgical     Comment: btl   Other Topics Concern  . Not on file   Social History Narrative   Regular exercise:  NoCaffeine Use: 2 drinks weekly    Past Surgical History  Procedure Date  . Thyroidectomy 2003    for graves disease  . Cesarean section 2001  . Cesarean section 2003  . Cesarean section 2007  . Tubal ligation     Family History  Problem Relation Age of Onset  . Alcohol abuse Mother   . Depression Mother   . Bipolar disorder Mother   . Alcohol abuse Father   . Diabetes Father   . Hypertension Father   . Cancer  Maternal Grandmother     colon  . Depression Maternal Grandmother   . Diabetes Maternal Grandmother   . Hypertension Maternal Grandmother   . Cancer Paternal Grandmother     breast  . Diabetes Paternal Grandmother   . Hypertension Paternal Grandmother     No Known Allergies  Current Outpatient Prescriptions on File Prior to Visit  Medication Sig Dispense Refill  . bisacodyl (DULCOLAX) 5 MG EC tablet Take 5 mg by mouth daily as needed.      . ergocalciferol (VITAMIN D2) 50000 UNITS capsule Take 1 capsule (50,000 Units total) by mouth once a week.  12 capsule  0  . escitalopram (LEXAPRO) 20 MG tablet Take 1 tablet (20 mg total) by mouth daily.  30 tablet  2  . levothyroxine (SYNTHROID, LEVOTHROID) 137 MCG tablet Take 137 mcg by mouth daily.      Marland Kitchen lubiprostone (AMITIZA) 8 MCG capsule Take 8 mcg by mouth 2 (two) times daily with a meal. ONLY TAKES WHEN SHE CAN AFFORD THEM      . Multiple Vitamins-Minerals (MULTIVITAMIN WITH MINERALS) tablet Take 1 tablet by mouth daily.      Marland Kitchen  pantoprazole (PROTONIX) 40 MG tablet Take 1 tablet (40 mg total) by mouth daily.  30 tablet  3  . valACYclovir (VALTREX) 500 MG tablet Take 1 tablet (500 mg total) by mouth as needed.  30 tablet  12  . metroNIDAZOLE (FLAGYL) 500 MG tablet Take 1 tablet (500 mg total) by mouth 3 (three) times daily.  14 tablet  0    BP 100/70  Pulse 84  Temp 98.7 F (37.1 C) (Oral)  Resp 16  Ht 5\' 5"  (1.651 m)  Wt 175 lb 0.6 oz (79.398 kg)  BMI 29.13 kg/m2  SpO2 98%  LMP 12/07/2011       Objective:   Physical Exam  Constitutional: She is oriented to person, place, and time. She appears well-developed and well-nourished. No distress.  HENT:  Head: Normocephalic and atraumatic.  Cardiovascular: Normal rate and regular rhythm.   No murmur heard. Pulmonary/Chest: Effort normal and breath sounds normal. No respiratory distress. She has no wheezes. She has no rales. She exhibits no tenderness.  Musculoskeletal: She  exhibits no edema.  Neurological: She is alert and oriented to person, place, and time.  Skin: Skin is warm and dry.  Psychiatric: She has a normal mood and affect. Her behavior is normal. Judgment and thought content normal.          Assessment & Plan:

## 2011-12-17 NOTE — Assessment & Plan Note (Signed)
She is s/p IV iron infusion with Dr. Myna Hidalgo.  Defer management to Heme.

## 2011-12-17 NOTE — Patient Instructions (Addendum)
Please schedule a follow up appointment in 6 months.   

## 2011-12-17 NOTE — Assessment & Plan Note (Signed)
Improved on lexapro. Monitor.

## 2011-12-17 NOTE — Assessment & Plan Note (Signed)
Stable. Continue dulcolax/amitiza.

## 2011-12-17 NOTE — Assessment & Plan Note (Signed)
Some improvement with lexapro- good lift in mood. Continue same. She declines referral to psychiatry or therapist at this time.

## 2011-12-28 ENCOUNTER — Telehealth: Payer: Self-pay | Admitting: Hematology & Oncology

## 2011-12-28 NOTE — Telephone Encounter (Signed)
Patient called and cx 12/30/11 and resch for 12/31/11

## 2011-12-30 ENCOUNTER — Other Ambulatory Visit: Payer: 59 | Admitting: Lab

## 2011-12-30 ENCOUNTER — Ambulatory Visit: Payer: 59 | Admitting: Hematology & Oncology

## 2011-12-31 ENCOUNTER — Ambulatory Visit (HOSPITAL_BASED_OUTPATIENT_CLINIC_OR_DEPARTMENT_OTHER): Payer: 59 | Admitting: Hematology & Oncology

## 2011-12-31 ENCOUNTER — Other Ambulatory Visit (HOSPITAL_BASED_OUTPATIENT_CLINIC_OR_DEPARTMENT_OTHER): Payer: 59 | Admitting: Lab

## 2011-12-31 VITALS — BP 109/67 | HR 79 | Temp 97.8°F | Resp 16 | Ht 65.0 in | Wt 175.0 lb

## 2011-12-31 DIAGNOSIS — D509 Iron deficiency anemia, unspecified: Secondary | ICD-10-CM

## 2011-12-31 DIAGNOSIS — N920 Excessive and frequent menstruation with regular cycle: Secondary | ICD-10-CM

## 2011-12-31 LAB — CBC WITH DIFFERENTIAL (CANCER CENTER ONLY)
BASO%: 0.7 % (ref 0.0–2.0)
HCT: 34.7 % — ABNORMAL LOW (ref 34.8–46.6)
LYMPH#: 1.4 10*3/uL (ref 0.9–3.3)
MONO#: 0.5 10*3/uL (ref 0.1–0.9)
NEUT%: 67.1 % (ref 39.6–80.0)
Platelets: 287 10*3/uL (ref 145–400)
RDW: 19.7 % — ABNORMAL HIGH (ref 11.1–15.7)
WBC: 6.1 10*3/uL (ref 3.9–10.0)

## 2011-12-31 LAB — CHCC SATELLITE - SMEAR

## 2011-12-31 LAB — IRON AND TIBC
Iron: 82 ug/dL (ref 42–145)
UIBC: 193 ug/dL (ref 125–400)

## 2011-12-31 LAB — FERRITIN: Ferritin: 126 ng/mL (ref 10–291)

## 2011-12-31 NOTE — Patient Instructions (Signed)
Sickle Cell Anemia °Sickle cell anemia needs regular medical care by your caregiver and awareness by you when to seek medical care. Pain is a common problem in children with sickle cell disease. This usually starts at less than 37 year of age. Pain can occur nearly anywhere in the body, but most commonly happens in the extremities, back, chest, or belly (abdomen). Pain episodes can start suddenly or may follow an illness. These attacks can appear as decreased activity, loss of appetite, change in behavior, or simply complaints of pain. °DIAGNOSIS  °· Specialized blood and gene testing can help make this diagnosis early in the disease. Blood tests may then be done to watch blood levels. °· Specialized brain scans are done when there are problems in the brain during a crisis. °· Lung testing may be done later in the disease. °HOME CARE INSTRUCTIONS  °· Maintain good hydration. Increase your child's fluid intake in hot weather and during exercise. °· Avoid smoking around your child. Smoking lowers the oxygen in the blood and can cause sickling. °· Control pain. Only give your child over-the-counter or prescription medicines for pain, discomfort, or fever as directed by their caregiver. Do not give aspirin to children because of the association with Reye's syndrome. °· Keep regular health care checks to keep a proper red blood cell (hemoglobin) level. A moderate anemia level protects against sickling crises. °· You or your child should receive all the same immunizations and care as the people around them. °· Moms should breastfeed their babies if possible. Use formulas with iron added if breastfeeding is not possible. Additional iron should not be given unless there is a lack of it. People with SCD build up iron faster than normal. Give folic acid and additional vitamins as directed. °· If you or your child has been prescribed antibiotics or other medications to prevent problems, give them as directed. °· Summer camps  are available for children with SCD. They may help young people deal with their disease. The camps introduce them to other children with the same condition. °· Young people with SCD may become frustrated or angry at their disease. This can cause rebellion and refusal to follow medical care. Help groups or counseling may help with these problems. °· Make sure your child wears a medical alert bracelet. When traveling, keep your medical information, caregiver's names, and the medications your child takes with you at all times. °SEEK IMMEDIATE MEDICAL CARE IF:  °· You or your child feels dizzy or faint. °· You or your child develops a new onset of abdominal pain, especially on the left side near the stomach area. °· You or your child develops a persistent, often uncomfortable and painful penile erection. This is called priapism. Always check young boys for this. It is often embarrassing for them and they may not bring it to your attention. This is a medical emergency and needs immediate treatment. If this is not treated it will lead to impotence. °· You or your child develops numbness in or has a hard time moving arms and legs. °· You or your child has a hard time with speech. °· You have a fever. °· You or your child develops signs of infection (chills, lethargy, irritability, poor eating, vomiting). The younger the child, the more you should be concerned. °· With fevers, do not give medications to lower the fever right away. This could cover up a problem that is developing. Notify your caregiver. °· You or your child develops pain that is   not helped with medicine. °· You or your child develops shortness of breath, or is coughing up pus-like or bloody sputum. °· You or your child develops any problems that are new and are causing you to worry. °Document Released: 04/07/2005 Document Revised: 03/22/2011 Document Reviewed: 05/28/2009 °ExitCare® Patient Information ©2013 [employer]. ° °

## 2011-12-31 NOTE — Progress Notes (Signed)
This office note has been dictated.

## 2012-01-01 NOTE — Progress Notes (Signed)
CC:   Cheyenne Craze, NP  DIAGNOSES: 1. Iron deficiency anemia. 2. Menometrorrhagia.  CURRENT THERAPY:  The patient is status post IV iron with Feraheme on November 8.  INTERIM HISTORY:  Cheyenne Hawkins comes in for followup.  She initially saw Cheyenne Hawkins on November 7.  At that point in time, her iron studies showed a ferritin of only 7.  Iron saturation was 14%.  She got a dose of Feraheme at 1020 mg on November 8.  She is feeling better.  She has a little more energy.  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  She has not had any bleeding otherwise.  There is no sickle cell with her or the family.  She has had no fever.  She has not had any "cravings".  She is not craving ice.  PHYSICAL EXAMINATION:  General:  This is a well-developed, well- nourished black female in no obvious distress.  Vital signs:  97.8, pulse 79, respiratory rate 16, blood pressure 109/67.  Weight is 175. Head and neck:  Shows a normocephalic, atraumatic skull.  There are no ocular or oral lesions.  There are no palpable cervical or supraclavicular lymph nodes.  Lungs:  Clear bilaterally.  Cardiac: Regular rate and rhythm with a normal S1, S2.  There are no murmurs, rubs or bruits.  Abdomen:  Soft with good bowel sounds.  There is no palpable abdominal mass.  There is no fluid wave.  There is no palpable hepatosplenomegaly.  Back:  No tenderness over the spine, ribs or hips. Extremities:  Show no clubbing, cyanosis or edema.  LABORATORY STUDIES:  White cell count 6.1, hemoglobin 11.1, hematocrit 34.7, platelet count 287.  IMPRESSION:  Cheyenne Hawkins is a 36 year old African American female with iron deficiency anemia.  She has menometrorrhagia.  Her blood counts are improving.  Her MCV is coming back up.  We will see what her iron studies show.  It is possible she may need another dose of Feraheme.  If so, we will get this set up.  Otherwise, I will plan to see her back in the office in  about 3 months.    ______________________________ Josph Macho, M.D. PRE/MEDQ  D:  12/31/2011  T:  01/01/2012  Job:  [id number]

## 2012-03-08 ENCOUNTER — Telehealth: Payer: Self-pay | Admitting: Family

## 2012-03-08 NOTE — Telephone Encounter (Signed)
Refill escitalopram 20 mg tablets qty 30

## 2012-03-08 NOTE — Telephone Encounter (Signed)
Rx previously sent on 12/17/11 for # 30 x 5 refills. Left detailed message on [facility] voicemail to check refills on file.

## 2012-03-31 ENCOUNTER — Telehealth: Payer: Self-pay | Admitting: Hematology & Oncology

## 2012-03-31 ENCOUNTER — Ambulatory Visit (HOSPITAL_BASED_OUTPATIENT_CLINIC_OR_DEPARTMENT_OTHER): Payer: 59 | Admitting: Internal Medicine

## 2012-03-31 ENCOUNTER — Other Ambulatory Visit (HOSPITAL_BASED_OUTPATIENT_CLINIC_OR_DEPARTMENT_OTHER): Payer: 59 | Admitting: Lab

## 2012-03-31 ENCOUNTER — Encounter: Payer: Self-pay | Admitting: Internal Medicine

## 2012-03-31 VITALS — BP 103/69 | HR 79 | Temp 97.8°F | Resp 16 | Ht 65.0 in | Wt 178.0 lb

## 2012-03-31 DIAGNOSIS — D509 Iron deficiency anemia, unspecified: Secondary | ICD-10-CM

## 2012-03-31 DIAGNOSIS — N920 Excessive and frequent menstruation with regular cycle: Secondary | ICD-10-CM

## 2012-03-31 DIAGNOSIS — D500 Iron deficiency anemia secondary to blood loss (chronic): Secondary | ICD-10-CM

## 2012-03-31 DIAGNOSIS — E0500 Thyrotoxicosis with diffuse goiter without thyrotoxic crisis or storm: Secondary | ICD-10-CM

## 2012-03-31 LAB — IRON AND TIBC
%SAT: 21 % (ref 20–55)
Iron: 63 ug/dL (ref 42–145)
UIBC: 234 ug/dL (ref 125–400)

## 2012-03-31 LAB — CBC WITH DIFFERENTIAL (CANCER CENTER ONLY)
BASO%: 0.8 % (ref 0.0–2.0)
Eosinophils Absolute: 0.1 10*3/uL (ref 0.0–0.5)
MONO#: 0.6 10*3/uL (ref 0.1–0.9)
NEUT#: 3 10*3/uL (ref 1.5–6.5)
Platelets: 298 10*3/uL (ref 145–400)
RBC: 3.85 10*6/uL (ref 3.70–5.32)
RDW: 13.1 % (ref 11.1–15.7)
WBC: 6.1 10*3/uL (ref 3.9–10.0)

## 2012-03-31 LAB — FERRITIN: Ferritin: 35 ng/mL (ref 10–291)

## 2012-03-31 NOTE — Patient Instructions (Signed)
Your hemoglobin and hematocrit are low but stable and no significant change from the previous lab work. Iron study and ferritin are still pending. Please call on Monday for the pending results and to see if he would need Feraheme infusion.

## 2012-03-31 NOTE — Progress Notes (Signed)
[facility] [phone]   [phone]  OFFICE PROGRESS NOTE  Lemont Fillers., NP [address] [place] [address]  DIAGNOSIS: iron deficiency anemia  PRIOR THERAPY:Feraheme infusion 1020 mg on 11/19/2011  CURRENT THERAPY:none.  INTERVAL HISTORY: Cheyenne Hawkins 37 y.o. female returns to the clinic today for visit. The patient is feeling fine today with no specific complaints except for mild fatigue. She also has a history of hypothyroidism and she is not very compliant with her current medication for the hypothyroidism. The patient denied having any dizzy spells. She continues to have menorrhagia with few days of heavy bleeding and clots. Her last Feraheme infusion was on 11/19/2011. She had repeat CBC and iron study performed earlier today and she is here for evaluation and discussion of her lab results. She denied having any significant weight loss or night sweats. She has no chest pain, shortness breath, cough or hemoptysis.  MEDICAL HISTORY: Past Medical History  Diagnosis Date  . History of chicken pox   . GERD (gastroesophageal reflux disease)   . Heart murmur   . Grave's disease 2002    s/p thyroidectomy  . Fatty liver 03/21/2011  . HSV-2 (herpes simplex virus 2) infection   . Depression 2012  . Anemia, iron deficiency 11/19/2011    ALLERGIES:  has No Known Allergies.  MEDICATIONS:  Current Outpatient Prescriptions  Medication Sig Dispense Refill  . bisacodyl (DULCOLAX) 5 MG EC tablet Take 5 mg by mouth daily as needed.      Marland Kitchen escitalopram (LEXAPRO) 20 MG tablet Take 1 tablet (20 mg total) by mouth daily.  30 tablet  5  . levothyroxine (SYNTHROID, LEVOTHROID) 137 MCG tablet Take 137 mcg by mouth daily.      . Multiple Vitamins-Minerals (MULTIVITAMIN WITH MINERALS) tablet Take 1 tablet by mouth daily.      . pantoprazole (PROTONIX) 40 MG tablet Take 1 tablet (40 mg total) by mouth daily.  30 tablet  3  .  valACYclovir (VALTREX) 500 MG tablet Take 1 tablet (500 mg total) by mouth as needed.  30 tablet  12  . lubiprostone (AMITIZA) 8 MCG capsule Take 8 mcg by mouth 2 (two) times daily with a meal. ONLY TAKES WHEN SHE CAN AFFORD THEM       No current facility-administered medications for this visit.    SURGICAL HISTORY:  Past Surgical History  Procedure Laterality Date  . Thyroidectomy  2003    for graves disease  . Cesarean section  2001  . Cesarean section  2003  . Cesarean section  2007  . Tubal ligation      REVIEW OF SYSTEMS:  A comprehensive review of systems was negative except for: Constitutional: positive for fatigue   PHYSICAL EXAMINATION: General appearance: alert, cooperative and no distress Head: Normocephalic, without obvious abnormality, atraumatic Neck: no adenopathy Resp: clear to auscultation bilaterally Cardio: regular rate and rhythm, S1, S2 normal, no murmur, click, rub or gallop GI: soft, non-tender; bowel sounds normal; no masses,  no organomegaly Extremities: extremities normal, atraumatic, no cyanosis or edema  ECOG PERFORMANCE STATUS: 1 - Symptomatic but completely ambulatory  Blood pressure 103/69, pulse 79, temperature 97.8 F (36.6 C), temperature source Oral, resp. rate 16, height 5\' 5"  (1.651 m), weight 178 lb (80.74 kg).  LABORATORY DATA: Lab Results  Component Value Date   WBC 6.1 03/31/2012   HGB 11.1* 03/31/2012   HCT 33.9* 03/31/2012   MCV 88 03/31/2012  PLT 298 03/31/2012      Chemistry      Component Value Date/Time   NA 136 11/18/2011 0954   K 3.8 11/18/2011 0954   CL 102 11/18/2011 0954   CO2 26 11/18/2011 0954   BUN 11 11/18/2011 0954   CREATININE 0.60 11/18/2011 0954   CREATININE 0.67 06/15/2011 1009      Component Value Date/Time   CALCIUM 9.2 11/18/2011 0954   ALKPHOS 65 11/18/2011 0954   AST 22 11/18/2011 0954   ALT 20 11/18/2011 0954   BILITOT 0.4 11/18/2011 0954       RADIOGRAPHIC STUDIES: No results found.  ASSESSMENT: this  is a very pleasant 37 years old [place] American female with history of iron deficiency anemia secondary to menorrhagia. The patient has intolerance to oral iron tablets secondary to constipation. Her CBC today showed stable hemoglobin and hematocrit. Her iron study and ferritin are still pending.     PLAN: I discussed the lab result with the patient today. I recommended for her to continue on observation for now with repeat CBC and iron study in 3 months. Her iron study and ferritin are still pending today. His lab results showed any significant abnormalities in these results, the patient may benefit from repeat intravenous infusion with Feraheme. She will call on Monday to see if she will need Feraheme infusion. She was advised to call immediately if she has any other concerning symptoms in the interval.  All questions were answered. The patient knows to call the clinic with any problems, questions or concerns. We can certainly see the patient much sooner if necessary.

## 2012-03-31 NOTE — Telephone Encounter (Signed)
Mailed June schedule °

## 2012-04-06 ENCOUNTER — Other Ambulatory Visit: Payer: Self-pay | Admitting: Oncology

## 2012-04-06 ENCOUNTER — Telehealth: Payer: Self-pay | Admitting: Oncology

## 2012-04-06 DIAGNOSIS — D509 Iron deficiency anemia, unspecified: Secondary | ICD-10-CM

## 2012-04-06 NOTE — Telephone Encounter (Addendum)
Message copied by Lacie Draft on Thu Apr 06, 2012  4:37 PM ------      Message from: Arlan Organ R      Created: Wed Apr 05, 2012  7:56 PM       Call - iron is dropping!! Need IV Feraheme 1020mg  x 1 dose.  Please set up!!!  Pete Left message to call office to schedule iron infusion. Lance Sell Johnson  ------

## 2012-04-07 ENCOUNTER — Telehealth: Payer: Self-pay | Admitting: Hematology & Oncology

## 2012-04-07 NOTE — Telephone Encounter (Signed)
Pt made 4-3 iron infusion

## 2012-04-10 ENCOUNTER — Telehealth: Payer: Self-pay | Admitting: *Deleted

## 2012-04-10 MED ORDER — ESCITALOPRAM OXALATE 20 MG PO TABS: 20.0000 mg | ORAL_TABLET | Freq: Every day | ORAL | Status: DC

## 2012-04-10 MED ORDER — PANTOPRAZOLE SODIUM 40 MG PO TBEC: 40.0000 mg | DELAYED_RELEASE_TABLET | Freq: Every day | ORAL | Status: DC

## 2012-04-10 NOTE — Telephone Encounter (Signed)
Received fax from [facility] pharmacy requesting refills for: lexapro and protonix. Refills sent for 1 month each x 2 refills.

## 2012-04-13 ENCOUNTER — Ambulatory Visit (HOSPITAL_BASED_OUTPATIENT_CLINIC_OR_DEPARTMENT_OTHER): Payer: 59

## 2012-04-13 VITALS — BP 113/68 | HR 84 | Temp 97.2°F

## 2012-04-13 DIAGNOSIS — D509 Iron deficiency anemia, unspecified: Secondary | ICD-10-CM

## 2012-04-13 MED ORDER — SODIUM CHLORIDE 0.9 % IV SOLN
Freq: Once | INTRAVENOUS | Status: AC
  Administered 2012-04-13: 13:00:00 via INTRAVENOUS

## 2012-04-13 MED ORDER — SODIUM CHLORIDE 0.9 % IV SOLN
1020.0000 mg | Freq: Once | INTRAVENOUS | Status: AC
  Administered 2012-04-13: 1020 mg via INTRAVENOUS
  Filled 2012-04-13: qty 34

## 2012-04-13 NOTE — Patient Instructions (Addendum)
Ferumoxytol injection What is this medicine? FERUMOXYTOL is an iron complex. Iron is used to make healthy red blood cells, which carry oxygen and nutrients throughout the body. This medicine is used to treat iron deficiency anemia in people with chronic kidney disease. This medicine may be used for other purposes; ask your health care provider or pharmacist if you have questions. What should I tell my health care provider before I take this medicine? They need to know if you have any of these conditions: -anemia not caused by low iron levels -high levels of iron in the blood -magnetic resonance imaging (MRI) test scheduled -an unusual or allergic reaction to iron, other medicines, foods, dyes, or preservatives -pregnant or trying to get pregnant -breast-feeding How should I use this medicine? This medicine is for infusion into a vein. It is given by a health care professional in a hospital or clinic setting. Talk to your pediatrician regarding the use of this medicine in children. Special care may be needed. Overdosage: If you think you've taken too much of this medicine contact a poison control center or emergency room at once. Overdosage: If you think you have taken too much of this medicine contact a poison control center or emergency room at once. NOTE: This medicine is only for you. Do not share this medicine with others. What if I miss a dose? It is important not to miss your dose. Call your doctor or health care professional if you are unable to keep an appointment. What may interact with this medicine? This medicine may interact with the following medications: -other iron products This list may not describe all possible interactions. Give your health care provider a list of all the medicines, herbs, non-prescription drugs, or dietary supplements you use. Also tell them if you smoke, drink alcohol, or use illegal drugs. Some items may interact with your medicine. What should I watch  for while using this medicine? Visit your doctor or healthcare professional regularly. Tell your doctor or healthcare professional if your symptoms do not start to get better or if they get worse. You may need blood work done while you are taking this medicine. You may need to follow a special diet. Talk to your doctor. Foods that contain iron include: whole grains/cereals, dried fruits, beans, or peas, leafy green vegetables, and organ meats (liver, kidney). What side effects may I notice from receiving this medicine? Side effects that you should report to your doctor or health care professional as soon as possible: -allergic reactions like skin rash, itching or hives, swelling of the face, lips, or tongue -breathing problems -changes in blood pressure -feeling faint or lightheaded, falls -fever or chills -flushing, sweating, or hot feelings -swelling of the ankles or feet Side effects that usually do not require medical attention (Report these to your doctor or health care professional if they continue or are bothersome.): -diarrhea -headache -nausea, vomiting -stomach pain This list may not describe all possible side effects. Call your doctor for medical advice about side effects. You may report side effects to FDA at 1-800-FDA-1088. Where should I keep my medicine? This drug is given in a hospital or clinic and will not be stored at home. NOTE: This sheet is a summary. It may not cover all possible information. If you have questions about this medicine, talk to your doctor, pharmacist, or health care provider.  2013, Elsevier/Gold Standard. (09/20/2007 9:48:25 PM)  

## 2012-06-12 ENCOUNTER — Telehealth: Payer: Self-pay | Admitting: Hematology & Oncology

## 2012-06-12 NOTE — Telephone Encounter (Signed)
Pt aware 6-20 moved to 6-17

## 2012-06-16 ENCOUNTER — Other Ambulatory Visit (HOSPITAL_BASED_OUTPATIENT_CLINIC_OR_DEPARTMENT_OTHER): Payer: 59 | Admitting: Lab

## 2012-06-16 ENCOUNTER — Encounter: Payer: Self-pay | Admitting: Family

## 2012-06-16 ENCOUNTER — Ambulatory Visit (INDEPENDENT_AMBULATORY_CARE_PROVIDER_SITE_OTHER): Payer: 59 | Admitting: Family

## 2012-06-16 VITALS — BP 100/80 | HR 60 | Temp 97.5°F | Resp 16 | Ht 65.0 in | Wt 176.0 lb

## 2012-06-16 DIAGNOSIS — D509 Iron deficiency anemia, unspecified: Secondary | ICD-10-CM

## 2012-06-16 DIAGNOSIS — E039 Hypothyroidism, unspecified: Secondary | ICD-10-CM

## 2012-06-16 DIAGNOSIS — B009 Herpesviral infection, unspecified: Secondary | ICD-10-CM

## 2012-06-16 DIAGNOSIS — K589 Irritable bowel syndrome without diarrhea: Secondary | ICD-10-CM

## 2012-06-16 LAB — CBC WITH DIFFERENTIAL (CANCER CENTER ONLY)
BASO%: 0.6 % (ref 0.0–2.0)
Eosinophils Absolute: 0.1 10*3/uL (ref 0.0–0.5)
LYMPH%: 31.5 % (ref 14.0–48.0)
MCV: 92 fL (ref 81–101)
MONO#: 0.5 10*3/uL (ref 0.1–0.9)
NEUT#: 3.8 10*3/uL (ref 1.5–6.5)
Platelets: 280 10*3/uL (ref 145–400)
RBC: 3.87 10*6/uL (ref 3.70–5.32)
RDW: 12.5 % (ref 11.1–15.7)
WBC: 6.5 10*3/uL (ref 3.9–10.0)

## 2012-06-16 LAB — IRON AND TIBC
%SAT: 37 % (ref 20–55)
Iron: 90 ug/dL (ref 42–145)
TIBC: 244 ug/dL — ABNORMAL LOW (ref 250–470)
UIBC: 154 ug/dL (ref 125–400)

## 2012-06-16 MED ORDER — VALACYCLOVIR HCL 500 MG PO TABS: 500.0000 mg | ORAL_TABLET | ORAL | Status: DC | PRN

## 2012-06-16 MED ORDER — ESCITALOPRAM OXALATE 20 MG PO TABS: 20.0000 mg | ORAL_TABLET | Freq: Every day | ORAL | Status: DC

## 2012-06-16 NOTE — Assessment & Plan Note (Signed)
Rare outbreaks. Uses valtrex prn, requesting refill.

## 2012-06-16 NOTE — Patient Instructions (Addendum)
Please follow up in 6 months.

## 2012-06-16 NOTE — Assessment & Plan Note (Signed)
This is being managed by Dr. Chestine Spore.  Management per Endo

## 2012-06-16 NOTE — Assessment & Plan Note (Signed)
Improved with increased water intake.  Monitor.

## 2012-06-16 NOTE — Progress Notes (Signed)
Subjective:    Patient ID: [name], female    DOB: Sep 29, 1975, 37 y.o.   MRN: [id number]  HPI  Ms. Shorb is a 37 yr old female who presents today for follow up.  1) Depression-  Reports good mood on lexapro.  Has been picking skin less.    2) IBS- She is not taking amitiza, drinking more water and notes that this has helped.   3) Hypothyroid- tolerating synthroid.  Feeling well on this does.     Review of Systems See HPI  Past Medical History  Diagnosis Date  . History of chicken pox   . GERD (gastroesophageal reflux disease)   . Heart murmur   . Grave's disease 2002    s/p thyroidectomy  . Fatty liver 03/21/2011  . HSV-2 (herpes simplex virus 2) infection   . Depression 2012  . Anemia, iron deficiency 11/19/2011    History   Social History  . Marital Status: Married    Spouse Name: N/A    Number of Children: 3  . Years of Education: N/A   Occupational History  .  [employer]   Social History Main Topics  . Smoking status: Never Smoker   . Smokeless tobacco: Never Used  . Alcohol Use: 6.0 oz/week    10 Glasses of wine per week  . Drug Use: No  . Sexually Active: Yes    Birth Control/ Protection: Surgical     Comment: btl   Other Topics Concern  . Not on file   Social History Narrative   Regular exercise:  No   Caffeine Use: 2 drinks weekly          Past Surgical History  Procedure Laterality Date  . Thyroidectomy  2003    for graves disease  . Cesarean section  2001  . Cesarean section  2003  . Cesarean section  2007  . Tubal ligation      Family History  Problem Relation Age of Onset  . Alcohol abuse Mother   . Depression Mother   . Bipolar disorder Mother   . Alcohol abuse Father   . Diabetes Father   . Hypertension Father   . Cancer Maternal Grandmother     colon  . Depression Maternal Grandmother   . Diabetes Maternal Grandmother   . Hypertension Maternal Grandmother   . Cancer Paternal Grandmother     breast  .  Diabetes Paternal Grandmother   . Hypertension Paternal Grandmother     No Known Allergies  Current Outpatient Prescriptions on File Prior to Visit  Medication Sig Dispense Refill  . bisacodyl (DULCOLAX) 5 MG EC tablet Take 5 mg by mouth daily as needed.      Marland Kitchen escitalopram (LEXAPRO) 20 MG tablet Take 1 tablet (20 mg total) by mouth daily.  30 tablet  2  . levothyroxine (SYNTHROID, LEVOTHROID) 137 MCG tablet Take 137 mcg by mouth daily.      Marland Kitchen lubiprostone (AMITIZA) 8 MCG capsule Take 8 mcg by mouth 2 (two) times daily with a meal. ONLY TAKES WHEN SHE CAN AFFORD THEM      . Multiple Vitamins-Minerals (MULTIVITAMIN WITH MINERALS) tablet Take 1 tablet by mouth daily.      . pantoprazole (PROTONIX) 40 MG tablet Take 1 tablet (40 mg total) by mouth daily.  30 tablet  2  . valACYclovir (VALTREX) 500 MG tablet Take 1 tablet (500 mg total) by mouth as needed.  30 tablet  12   No current facility-administered  medications on file prior to visit.    BP 100/80  Pulse 60  Temp(Src) 97.5 F (36.4 C) (Oral)  Resp 16  Ht 5\' 5"  (1.651 m)  Wt 176 lb 0.6 oz (79.851 kg)  BMI 29.29 kg/m2  SpO2 99%  LMP 06/15/2012       Objective:   Physical Exam  Constitutional: She is oriented to person, place, and time. She appears well-developed and well-nourished. No distress.  Cardiovascular: Normal rate and regular rhythm.   No murmur heard. Pulmonary/Chest: Effort normal and breath sounds normal. No respiratory distress. She has no wheezes. She has no rales. She exhibits no tenderness.  Musculoskeletal: She exhibits no edema.  Neurological: She is alert and oriented to person, place, and time.  Psychiatric: She has a normal mood and affect. Her behavior is normal. Judgment and thought content normal.        Assessment & Plan:

## 2012-06-16 NOTE — Assessment & Plan Note (Signed)
This is being managed by Dr. Myna Hidalgo and she will have blood work drawn today at hematology.  She receives iron infusions.

## 2012-06-23 ENCOUNTER — Other Ambulatory Visit: Payer: 59 | Admitting: Lab

## 2012-06-27 ENCOUNTER — Ambulatory Visit (HOSPITAL_BASED_OUTPATIENT_CLINIC_OR_DEPARTMENT_OTHER): Payer: 59 | Admitting: Medical

## 2012-06-27 VITALS — BP 101/64 | HR 73 | Temp 98.1°F | Resp 16 | Ht 65.0 in | Wt 175.0 lb

## 2012-06-27 DIAGNOSIS — D509 Iron deficiency anemia, unspecified: Secondary | ICD-10-CM

## 2012-06-27 NOTE — Progress Notes (Signed)
DIAGNOSES: 1. Iron deficiency anemia. 2. Menometrorrhagia.  CURRENT THERAPY:  The patient is status post IV iron with Feraheme on 04/13/2012  INTERIM HISTORY: Cheyenne Hawkins presents today for an office followup visit.  Overall she reports that she's doing relatively well.  Her last IV iron fusion was on 04/13/2012.  Her most recent iron studies back on June 6 revealed an iron of 90 with 37% saturation and a ferritin of 185.  She's not reporting any excessive fatigue or weakness.  She does not report that she's craving any ice.  She is able to perform her activities of daily living without any hindrance or decline.  She reports that she has a good appetite.  She denies any nausea, vomiting, diarrhea or constipation.  She denies any fevers, chills or night sweats.  She denies any chest pain, shortness of breath or cough.  She denies any abdominal pain.  She denies any obvious or abnormal bleeding.  She denies any melena or hematochezia.  She denies a sore mouth or sore tongue.  She denies any headaches, visual changes or rashes.  Review of Systems: Constitutional:Negative for malaise/fatigue, fever, chills, weight loss, diaphoresis, activity change, appetite change, and unexpected weight change.  HEENT: Negative for double vision, blurred vision, visual loss, ear pain, tinnitus, congestion, rhinorrhea, epistaxis sore throat or sinus disease, oral pain/lesion, tongue soreness Respiratory: Negative for cough, chest tightness, shortness of breath, wheezing and stridor.  Cardiovascular: Negative for chest pain, palpitations, leg swelling, orthopnea, PND, DOE or claudication Gastrointestinal: Negative for nausea, vomiting, abdominal pain, diarrhea, constipation, blood in stool, melena, hematochezia, abdominal distention, anal bleeding, rectal pain, anorexia and hematemesis.  Genitourinary: Negative for dysuria, frequency, hematuria,  Musculoskeletal: Negative for myalgias, back pain, joint swelling,  arthralgias and gait problem.  Skin: Negative for rash, color change, pallor and wound.  Neurological:. Negative for dizziness/light-headedness, tremors, seizures, syncope, facial asymmetry, speech difficulty, weakness, numbness, headaches and paresthesias.  Hematological: Negative for adenopathy. Does not bruise/bleed easily.  Psychiatric/Behavioral:  Negative for depression, no loss of interest in normal activity or change in sleep pattern.   Physical Exam: This is a pleasant 37 year old well-developed well-nourished African American female in no obvious distress Vitals: Temperature 98.1 degrees pulse 73 respirations 16 blood pressure 101/64 weight 175 pounds HEENT reveals a normocephalic, atraumatic skull, no scleral icterus, no oral lesions  Neck is supple without any cervical or supraclavicular adenopathy.  Lungs are clear to auscultation bilaterally. There are no wheezes, rales or rhonci Cardiac is regular rate and rhythm with a normal S1 and S2. There are no murmurs, rubs, or bruits.  Abdomen is soft with good bowel sounds, there is no palpable mass. There is no palpable hepatosplenomegaly. There is no palpable fluid wave.  Musculoskeletal no tenderness of the spine, ribs, or hips.  Extremities there are no clubbing, cyanosis, or edema.  Skin no petechia, purpura or ecchymosis Neurologic is nonfocal.  Laboratory Data: 06/16/2012 White count 6.5 hemoglobin 11.5 hematocrit 35.6 MCV 92 platelets 280,000 Iron 90 with 37% saturation, ferritin 185  Current Outpatient Prescriptions on File Prior to Visit  Medication Sig Dispense Refill  . bisacodyl (DULCOLAX) 5 MG EC tablet Take 5 mg by mouth daily as needed.      Marland Kitchen escitalopram (LEXAPRO) 20 MG tablet Take 1 tablet (20 mg total) by mouth daily.  30 tablet  5  . levothyroxine (SYNTHROID, LEVOTHROID) 137 MCG tablet Take 137 mcg by mouth daily.      . Multiple Vitamins-Minerals (MULTIVITAMIN WITH MINERALS) tablet Take  1 tablet by mouth daily.       . pantoprazole (PROTONIX) 40 MG tablet Take 1 tablet (40 mg total) by mouth daily.  30 tablet  2  . valACYclovir (VALTREX) 500 MG tablet Take 1 tablet (500 mg total) by mouth as needed.  30 tablet  11   No current facility-administered medications on file prior to visit.   Assessment/Plan: Pleasant 37 year old African American female with the following issues:  #1.  Iron deficiency anemia.  Secondary to menometrorrhagia.  The last time she received IV iron was back in April.  Her most recent iron studies are adequate.  #2.  Followup.  Cheyenne Hawkins will follow back up with [place] in 3 months but before then should there be questions or concerns.

## 2012-06-28 ENCOUNTER — Ambulatory Visit: Payer: 59 | Admitting: Hematology & Oncology

## 2012-06-30 ENCOUNTER — Ambulatory Visit: Payer: 59 | Admitting: Hematology & Oncology

## 2012-08-22 ENCOUNTER — Other Ambulatory Visit: Payer: Self-pay | Admitting: Family

## 2012-09-13 ENCOUNTER — Other Ambulatory Visit: Payer: Self-pay | Admitting: Family

## 2012-09-26 ENCOUNTER — Telehealth: Payer: Self-pay | Admitting: Hematology & Oncology

## 2012-09-26 NOTE — Telephone Encounter (Signed)
Patient called to cancel appt and will cb to reschedule later.

## 2012-09-27 ENCOUNTER — Other Ambulatory Visit: Payer: 59 | Admitting: Lab

## 2012-09-27 ENCOUNTER — Ambulatory Visit: Payer: 59 | Admitting: Hematology & Oncology

## 2012-10-24 ENCOUNTER — Other Ambulatory Visit: Payer: Self-pay | Admitting: Family

## 2012-12-15 ENCOUNTER — Encounter: Payer: Self-pay | Admitting: Family

## 2012-12-15 ENCOUNTER — Ambulatory Visit (INDEPENDENT_AMBULATORY_CARE_PROVIDER_SITE_OTHER): Payer: 59 | Admitting: Family

## 2012-12-15 ENCOUNTER — Telehealth: Payer: Self-pay | Admitting: Family

## 2012-12-15 VITALS — BP 106/80 | HR 91 | Temp 98.1°F | Resp 16 | Ht 65.0 in | Wt 174.0 lb

## 2012-12-15 DIAGNOSIS — E039 Hypothyroidism, unspecified: Secondary | ICD-10-CM

## 2012-12-15 DIAGNOSIS — D509 Iron deficiency anemia, unspecified: Secondary | ICD-10-CM

## 2012-12-15 DIAGNOSIS — D649 Anemia, unspecified: Secondary | ICD-10-CM

## 2012-12-15 DIAGNOSIS — F418 Other specified anxiety disorders: Secondary | ICD-10-CM

## 2012-12-15 DIAGNOSIS — F341 Dysthymic disorder: Secondary | ICD-10-CM

## 2012-12-15 LAB — CBC WITH DIFFERENTIAL/PLATELET
Basophils Relative: 1 % (ref 0–1)
Eosinophils Absolute: 0.1 10*3/uL (ref 0.0–0.7)
Eosinophils Relative: 1 % (ref 0–5)
Hemoglobin: 12.2 g/dL (ref 12.0–15.0)
Lymphs Abs: 1.6 10*3/uL (ref 0.7–4.0)
MCH: 28.6 pg (ref 26.0–34.0)
MCHC: 33.2 g/dL (ref 30.0–36.0)
Monocytes Relative: 6 % (ref 3–12)
Neutro Abs: 4.8 10*3/uL (ref 1.7–7.7)
Neutrophils Relative %: 69 % (ref 43–77)
Platelets: 300 10*3/uL (ref 150–400)
RBC: 4.27 MIL/uL (ref 3.87–5.11)
RDW: 13.3 % (ref 11.5–15.5)

## 2012-12-15 LAB — FERRITIN: Ferritin: 69 ng/mL (ref 10–291)

## 2012-12-15 LAB — IRON AND TIBC
Iron: 79 ug/dL (ref 42–145)
UIBC: 227 ug/dL (ref 125–400)

## 2012-12-15 LAB — TSH: TSH: 2.558 u[IU]/mL (ref 0.350–4.500)

## 2012-12-15 NOTE — Progress Notes (Signed)
Pre visit review using our clinic review tool, if applicable. No additional management support is needed unless otherwise documented below in the visit note. 

## 2012-12-15 NOTE — Telephone Encounter (Signed)
Left detailed message with below #s and to call if any questions.

## 2012-12-15 NOTE — Telephone Encounter (Signed)
I looked up some therapists for her. Here are some names she can try:  Glade Lloyd Counselor, [place], MFT [phone]    Carollee Massed [phone]   Ms. Rhett Bannister Counselor, [place], [place] , LCAS-A [phone]

## 2012-12-15 NOTE — Progress Notes (Signed)
Subjective:    Patient ID: Cheyenne Hawkins, female    DOB: 1975/10/04, 37 y.o.   MRN: [id number]  HPI  Cheyenne Hawkins is a 37 yr female who presents today for follow up of multiple medical problems.  1) Depression-  Reports lexapro is helping.  But feeling overwhelmed and hopeless. Denies SI.  Husband is manipulative.  Very unhappy in her marriage.  Feels like her depression is situational. "picking' is better on lexapro. Doesn't want to add any additional meds.  Admits to drinking at night to help her sleep and try to avoid the pain of being with her husband. Reports 2 mixed drinks last night.   2) hypothyroid-continues synthroid- due for TSH check.   3) IBS- continues amitiza, requesting refill.   4) Anemia- stopped seeing Dr. Myna Hidalgo due to cost. Previously she was receiving iv iron infusions.   5) Menorrhagia- saw GYN, told not fibroids but offered uterine ablation.       Review of Systems See HPI  Past Medical History  Diagnosis Date  . History of chicken pox   . GERD (gastroesophageal reflux disease)   . Heart murmur   . Grave's disease 2002    s/p thyroidectomy  . Fatty liver 03/21/2011  . HSV-2 (herpes simplex virus 2) infection   . Depression 2012  . Anemia, iron deficiency 11/19/2011    History   Social History  . Marital Status: Married    Spouse Name: N/A    Number of Children: 3  . Years of Education: N/A   Occupational History  .  [employer]   Social History Main Topics  . Smoking status: Never Smoker   . Smokeless tobacco: Never Used  . Alcohol Use: 6.0 oz/week    10 Glasses of wine per week  . Drug Use: No  . Sexual Activity: Yes    Birth Control/ Protection: Surgical     Comment: btl   Other Topics Concern  . Not on file   Social History Narrative   Regular exercise:  No   Caffeine Use: 2 drinks weekly          Past Surgical History  Procedure Laterality Date  . Thyroidectomy  2003    for graves disease  . Cesarean section  2001   . Cesarean section  2003  . Cesarean section  2007  . Tubal ligation      Family History  Problem Relation Age of Onset  . Alcohol abuse Mother   . Depression Mother   . Bipolar disorder Mother   . Alcohol abuse Father   . Diabetes Father   . Hypertension Father   . Cancer Maternal Grandmother     colon  . Depression Maternal Grandmother   . Diabetes Maternal Grandmother   . Hypertension Maternal Grandmother   . Cancer Paternal Grandmother     breast  . Diabetes Paternal Grandmother   . Hypertension Paternal Grandmother     No Known Allergies  Current Outpatient Prescriptions on File Prior to Visit  Medication Sig Dispense Refill  . bisacodyl (DULCOLAX) 5 MG EC tablet Take 5 mg by mouth daily as needed.      Marland Kitchen escitalopram (LEXAPRO) 20 MG tablet Take 1 tablet (20 mg total) by mouth daily.  30 tablet  5  . levothyroxine (SYNTHROID, LEVOTHROID) 137 MCG tablet Take 137 mcg by mouth daily.      . Multiple Vitamins-Minerals (MULTIVITAMIN WITH MINERALS) tablet Take 1 tablet by mouth daily.      Marland Kitchen  pantoprazole (PROTONIX) 40 MG tablet TAKE 1 TABLET (40 MG TOTAL) BY MOUTH DAILY.  30 tablet  2  . valACYclovir (VALTREX) 500 MG tablet Take 1 tablet (500 mg total) by mouth as needed.  30 tablet  11   No current facility-administered medications on file prior to visit.    BP 106/80  Pulse 91  Temp(Src) 98.1 F (36.7 C) (Oral)  Resp 16  Ht 5\' 5"  (1.651 m)  Wt 174 lb 0.6 oz (78.944 kg)  BMI 28.96 kg/m2  SpO2 97%       Objective:   Physical Exam  Constitutional: She appears well-developed and well-nourished. No distress.  Cardiovascular: Normal rate and regular rhythm.   No murmur heard. Pulmonary/Chest: Breath sounds normal. No respiratory distress. She has no wheezes. She has no rales. She exhibits no tenderness.  Neurological: She is alert.  Psychiatric:  Tearful, but pleasant and appropriate          Assessment & Plan:

## 2012-12-15 NOTE — Assessment & Plan Note (Signed)
Deteriorated due to stress with her husband. She is requesting referral to an AA therapist. I have provided her with a few names of local AA therapists. Continue current dose of lexapro.

## 2012-12-15 NOTE — Assessment & Plan Note (Signed)
Recheck blood count and levels. Discussed that if she undergoes uterine ablation she may have less iron loss and require fewer iron transfusions.

## 2012-12-15 NOTE — Assessment & Plan Note (Signed)
Stable on synthroid, check TSH.  

## 2012-12-15 NOTE — Patient Instructions (Signed)
Please complete lab work prior to leaving.   Please schedule a follow up appointment in 3 months.  

## 2012-12-19 ENCOUNTER — Encounter: Payer: Self-pay | Admitting: Family

## 2013-01-23 ENCOUNTER — Other Ambulatory Visit: Payer: Self-pay | Admitting: Family

## 2013-03-05 ENCOUNTER — Other Ambulatory Visit: Payer: Self-pay | Admitting: Family

## 2013-03-05 NOTE — Telephone Encounter (Signed)
Left detailed message informing patient of medication refill and to call our office to schedule a follow up appointment.

## 2013-03-05 NOTE — Telephone Encounter (Signed)
Refills sent to pharmacy for pantoprazole. Pt is due for 3 month follow up in March.  Please call pt to arrange appt.

## 2013-03-13 NOTE — Telephone Encounter (Signed)
Left message for patient to return my call.

## 2013-03-15 NOTE — Telephone Encounter (Signed)
Left message for patient to return my call.

## 2013-04-23 ENCOUNTER — Ambulatory Visit: Payer: 59 | Admitting: Family

## 2013-04-23 DIAGNOSIS — Z0289 Encounter for other administrative examinations: Secondary | ICD-10-CM

## 2013-04-27 ENCOUNTER — Encounter: Payer: Self-pay | Admitting: Family

## 2013-04-27 ENCOUNTER — Ambulatory Visit (INDEPENDENT_AMBULATORY_CARE_PROVIDER_SITE_OTHER): Payer: 59 | Admitting: Family

## 2013-04-27 VITALS — BP 96/60 | HR 94 | Temp 98.0°F | Resp 16 | Ht 65.0 in | Wt 175.1 lb

## 2013-04-27 DIAGNOSIS — D509 Iron deficiency anemia, unspecified: Secondary | ICD-10-CM

## 2013-04-27 DIAGNOSIS — F418 Other specified anxiety disorders: Secondary | ICD-10-CM

## 2013-04-27 DIAGNOSIS — D649 Anemia, unspecified: Secondary | ICD-10-CM

## 2013-04-27 DIAGNOSIS — E039 Hypothyroidism, unspecified: Secondary | ICD-10-CM

## 2013-04-27 DIAGNOSIS — K589 Irritable bowel syndrome without diarrhea: Secondary | ICD-10-CM

## 2013-04-27 DIAGNOSIS — F341 Dysthymic disorder: Secondary | ICD-10-CM

## 2013-04-27 LAB — CBC WITH DIFFERENTIAL/PLATELET
BASOS ABS: 0.1 10*3/uL (ref 0.0–0.1)
Basophils Relative: 1 % (ref 0–1)
Eosinophils Absolute: 0.1 10*3/uL (ref 0.0–0.7)
Eosinophils Relative: 1 % (ref 0–5)
HEMATOCRIT: 38.1 % (ref 36.0–46.0)
Hemoglobin: 12.5 g/dL (ref 12.0–15.0)
LYMPHS PCT: 28 % (ref 12–46)
Lymphs Abs: 2.2 10*3/uL (ref 0.7–4.0)
MCH: 28.5 pg (ref 26.0–34.0)
MCHC: 32.8 g/dL (ref 30.0–36.0)
MCV: 86.8 fL (ref 78.0–100.0)
Monocytes Absolute: 0.6 10*3/uL (ref 0.1–1.0)
Monocytes Relative: 8 % (ref 3–12)
NEUTROS ABS: 4.8 10*3/uL (ref 1.7–7.7)
Neutrophils Relative %: 62 % (ref 43–77)
PLATELETS: 326 10*3/uL (ref 150–400)
RBC: 4.39 MIL/uL (ref 3.87–5.11)
RDW: 14.3 % (ref 11.5–15.5)
WBC: 7.8 10*3/uL (ref 4.0–10.5)

## 2013-04-27 MED ORDER — PANTOPRAZOLE SODIUM 40 MG PO TBEC
DELAYED_RELEASE_TABLET | ORAL | Status: DC
Start: 2013-04-27 — End: 2014-01-15

## 2013-04-27 MED ORDER — LEVOTHYROXINE SODIUM 137 MCG PO TABS: 137.0000 ug | ORAL_TABLET | Freq: Every day | ORAL | Status: DC

## 2013-04-27 MED ORDER — ESCITALOPRAM OXALATE 20 MG PO TABS: ORAL_TABLET | ORAL | Status: DC

## 2013-04-27 NOTE — Progress Notes (Signed)
Pre visit review using our clinic review tool, if applicable. No additional management support is needed unless otherwise documented below in the visit note. 

## 2013-04-27 NOTE — Assessment & Plan Note (Signed)
She is maintained on synthroid, obtain follow up TSH.

## 2013-04-27 NOTE — Progress Notes (Signed)
Subjective:    Patient ID: Cheyenne Hawkins, female    DOB: [birth year], 38 y.o.   MRN: [id number]  HPI  Cheyenne Hawkins is a 38 yr old female who presents today for follow up of multiple medical problems.  Depression- maintained on lexapro.  Last visit admitted to drinking in the evenings.  She was given some names of some therapists.  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   Hypothryoid- maintained on synthroid.   Lab Results  Component Value Date   TSH 2.558 12/15/2012   IBS-  Has not been using amitiza.  Using dulcolax prn.   Anemia-  Had been seeing Dr. Marin Olp but stopped going due to cost.  Reports heavy period 3 weeks ago.  Lab Results  Component Value Date   WBC 6.9 12/15/2012   HGB 12.2 12/15/2012   HCT 36.8 12/15/2012   MCV 86.2 12/15/2012   PLT 300 12/15/2012  Has been offered uterine ablation by GYN due to menorrhagia.    Review of Systems    see HPI  Past Medical History  Diagnosis Date  . History of chicken pox   . GERD (gastroesophageal reflux disease)   . Heart murmur   . Grave's disease 2002    s/p thyroidectomy  . Fatty liver 03/21/2011  . HSV-2 (herpes simplex virus 2) infection   . Depression 2012  . Anemia, iron deficiency 11/19/2011    History   Social History  . Marital Status: Married    Spouse Name: N/A    Number of Children: 3  . Years of Education: N/A   Occupational History  .  [employer]   Social History Main Topics  . Smoking status: Never Smoker   . Smokeless tobacco: Never Used  . Alcohol Use: 6.0 oz/week    10 Glasses of wine per week  . Drug Use: No  . Sexual Activity: Yes    Birth Control/ Protection: Surgical     Comment: btl   Other Topics Concern  . Not on file   Social History Narrative   Regular exercise:  No   Caffeine Use: 2 drinks weekly          Past Surgical History  Procedure Laterality Date  . Thyroidectomy  2003    for graves disease  . Cesarean section  2001  . Cesarean  section  2003  . Cesarean section  2007  . Tubal ligation      Family History  Problem Relation Age of Onset  . Alcohol abuse Mother   . Depression Mother   . Bipolar disorder Mother   . Alcohol abuse Father   . Diabetes Father   . Hypertension Father   . Cancer Maternal Grandmother     colon  . Depression Maternal Grandmother   . Diabetes Maternal Grandmother   . Hypertension Maternal Grandmother   . Cancer Paternal Grandmother     breast  . Diabetes Paternal Grandmother   . Hypertension Paternal Grandmother     No Known Allergies  Current Outpatient Prescriptions on File Prior to Visit  Medication Sig Dispense Refill  . bisacodyl (DULCOLAX) 5 MG EC tablet Take 5 mg by mouth daily as needed.      Marland Kitchen escitalopram (LEXAPRO) 20 MG tablet TAKE 1 TABLET (20 MG TOTAL) BY MOUTH DAILY.  30 tablet  2  . levothyroxine (SYNTHROID, LEVOTHROID) 137 MCG tablet Take 137 mcg by mouth daily.      Marland Kitchen  Multiple Vitamins-Minerals (MULTIVITAMIN WITH MINERALS) tablet Take 1 tablet by mouth daily.      . pantoprazole (PROTONIX) 40 MG tablet TAKE 1 TABLET BY MOUTH DAILY.  30 tablet  2  . valACYclovir (VALTREX) 500 MG tablet Take 1 tablet (500 mg total) by mouth as needed.  30 tablet  11   No current facility-administered medications on file prior to visit.    Ht 5\' 5"  (1.651 m)  Wt 175 lb 1.3 oz (79.416 kg)  BMI 29.13 kg/m2    Objective:   Physical Exam  Constitutional: She is oriented to person, place, and time. She appears well-developed and well-nourished. No distress.  HENT:  Head: Normocephalic and atraumatic.  Cardiovascular: Normal rate and regular rhythm.   No murmur heard. Pulmonary/Chest: Breath sounds normal. No respiratory distress. She has no wheezes. She has no rales. She exhibits no tenderness.  Neurological: She is alert and oriented to person, place, and time.  Skin: Skin is warm and dry.  Psychiatric: She has a normal mood and affect. Her behavior is normal. Judgment and  thought content normal.          Assessment & Plan:

## 2013-04-27 NOTE — Assessment & Plan Note (Signed)
Stable on lexapro. She does want names of therapists in case she needs them down the road.

## 2013-04-27 NOTE — Assessment & Plan Note (Signed)
Check follow up cbc.

## 2013-04-27 NOTE — Patient Instructions (Addendum)
Complete lab work prior to leaving. Follow up in 6 months. Here are some names of therapists. Vic Ripper  Counselor, [employer], MFT  [phone]  Kandra Nicolas [phone]  Ms. Madaline Guthrie  Counselor, [place], [place] , LCAS-A  [phone]

## 2013-04-27 NOTE — Assessment & Plan Note (Signed)
Notes food seems to "sit" in her stomach. I advised her work on trying to have regular BM's. Add tablespoon of miralax once daily. If symptoms do not improve, may need gastric emptying study or GI follow up for EGD.

## 2013-04-28 LAB — TSH: TSH: 5.634 u[IU]/mL — ABNORMAL HIGH (ref 0.350–4.500)

## 2013-04-30 ENCOUNTER — Telehealth: Payer: Self-pay | Admitting: Family

## 2013-04-30 DIAGNOSIS — E039 Hypothyroidism, unspecified: Secondary | ICD-10-CM

## 2013-04-30 MED ORDER — LEVOTHYROXINE SODIUM 150 MCG PO TABS: 150.0000 ug | ORAL_TABLET | Freq: Every day | ORAL | Status: DC

## 2013-04-30 NOTE — Telephone Encounter (Signed)
Review labs. Synthroid should be increased from 137 to 150 mcg. Repeat TSH in 6 weeks.

## 2013-04-30 NOTE — Telephone Encounter (Signed)
Left detailed message on voicemail and to call if any questions. Lab order entered.

## 2013-05-30 ENCOUNTER — Ambulatory Visit (INDEPENDENT_AMBULATORY_CARE_PROVIDER_SITE_OTHER): Payer: 59 | Admitting: Family

## 2013-05-30 ENCOUNTER — Encounter: Payer: Self-pay | Admitting: Family

## 2013-05-30 VITALS — BP 100/78 | HR 77 | Temp 98.1°F | Resp 16 | Ht 65.0 in | Wt 176.0 lb

## 2013-05-30 DIAGNOSIS — M545 Low back pain, unspecified: Secondary | ICD-10-CM

## 2013-05-30 MED ORDER — METHYLPREDNISOLONE 4 MG PO KIT: PACK | ORAL | Status: DC

## 2013-05-30 MED ORDER — CYCLOBENZAPRINE HCL 5 MG PO TABS: ORAL_TABLET | ORAL | Status: DC

## 2013-05-30 NOTE — Progress Notes (Signed)
Subjective:    Patient ID: Cheyenne Hawkins, female    DOB: 1975/08/03, 38 y.o.   MRN: [id number]  HPI  Cheyenne Hawkins is a 38 yr old female who presents today to discuss back pain. Reports no improvement with aleve.  Has been present x >3 months. Seems to be worsening. Located in the mid upper back.  Sleeps with harder pillow. Pain is tolerable.  5/10. Radiates to sides. Trying to work on her poster.  If she hangs her neck down she has some stretch in the area.  Denies leg numbness/weakness.    Review of Systems See HPI  Past Medical History  Diagnosis Date  . History of chicken pox   . GERD (gastroesophageal reflux disease)   . Heart murmur   . Grave's disease 2002    s/p thyroidectomy  . Fatty liver 03/21/2011  . HSV-2 (herpes simplex virus 2) infection   . Depression 2012  . Anemia, iron deficiency 11/19/2011    History   Social History  . Marital Status: Married    Spouse Name: N/A    Number of Children: 3  . Years of Education: N/A   Occupational History  .  [employer]   Social History Main Topics  . Smoking status: Never Smoker   . Smokeless tobacco: Never Used  . Alcohol Use: 6.0 oz/week    10 Glasses of wine per week  . Drug Use: No  . Sexual Activity: Yes    Birth Control/ Protection: Surgical     Comment: btl   Other Topics Concern  . Not on file   Social History Narrative   Regular exercise:  No   Caffeine Use: 2 drinks weekly          Past Surgical History  Procedure Laterality Date  . Thyroidectomy  2003    for graves disease  . Cesarean section  2001  . Cesarean section  2003  . Cesarean section  2007  . Tubal ligation      Family History  Problem Relation Age of Onset  . Alcohol abuse Mother   . Depression Mother   . Bipolar disorder Mother   . Alcohol abuse Father   . Diabetes Father   . Hypertension Father   . Cancer Maternal Grandmother     colon  . Depression Maternal Grandmother   . Diabetes Maternal Grandmother   .  Hypertension Maternal Grandmother   . Cancer Paternal Grandmother     breast  . Diabetes Paternal Grandmother   . Hypertension Paternal Grandmother     No Known Allergies  Current Outpatient Prescriptions on File Prior to Visit  Medication Sig Dispense Refill  . bisacodyl (DULCOLAX) 5 MG EC tablet Take 5 mg by mouth daily as needed.      Marland Kitchen escitalopram (LEXAPRO) 20 MG tablet TAKE 1 TABLET (20 MG TOTAL) BY MOUTH DAILY.  30 tablet  5  . Multiple Vitamins-Minerals (MULTIVITAMIN WITH MINERALS) tablet Take 1 tablet by mouth daily.      . pantoprazole (PROTONIX) 40 MG tablet TAKE 1 TABLET BY MOUTH DAILY.  30 tablet  5  . valACYclovir (VALTREX) 500 MG tablet Take 1 tablet (500 mg total) by mouth as needed.  30 tablet  11   No current facility-administered medications on file prior to visit.    BP 100/78  Pulse 77  Temp(Src) 98.1 F (36.7 C) (Oral)  Resp 16  Ht 5\' 5"  (1.651 m)  Wt 176 lb (79.833 kg)  BMI 29.29 kg/m2  SpO2 99%       Objective:   Physical Exam  Constitutional: She is oriented to person, place, and time. She appears well-developed and well-nourished. No distress.  Cardiovascular: Normal rate and regular rhythm.   No murmur heard. Pulmonary/Chest: Effort normal and breath sounds normal. No respiratory distress. She has no wheezes. She has no rales. She exhibits no tenderness.  Musculoskeletal: She exhibits no edema.       Cervical back: She exhibits no tenderness.       Thoracic back: She exhibits no tenderness.       Lumbar back: She exhibits no tenderness.  Neurological: She is alert and oriented to person, place, and time.  Psychiatric: She has a normal mood and affect. Her behavior is normal. Judgment and thought content normal.          Assessment & Plan:

## 2013-05-30 NOTE — Patient Instructions (Signed)
Start flexeril at bedtime and medrol dose pak. Call if symptoms worsen, or if not improved in 2 weeks.

## 2013-05-30 NOTE — Progress Notes (Signed)
Pre visit review using our clinic review tool, if applicable. No additional management support is needed unless otherwise documented below in the visit note. 

## 2013-06-01 DIAGNOSIS — M545 Low back pain, unspecified: Secondary | ICD-10-CM | POA: Insufficient documentation

## 2013-06-01 NOTE — Assessment & Plan Note (Signed)
Start flexeril at bedtime and medrol dose pak. Call if symptoms worsen, or if not improved in 2 weeks.  If symptoms do not improve, will need MRI of the lumbar spine.

## 2013-10-03 ENCOUNTER — Other Ambulatory Visit: Payer: Self-pay | Admitting: Family

## 2013-10-22 ENCOUNTER — Ambulatory Visit: Payer: 59 | Admitting: Family

## 2013-10-24 ENCOUNTER — Encounter: Payer: Self-pay | Admitting: Internal Medicine

## 2013-11-12 ENCOUNTER — Encounter: Payer: Self-pay | Admitting: Family

## 2013-12-19 ENCOUNTER — Encounter: Payer: 59 | Admitting: Family

## 2014-01-15 ENCOUNTER — Ambulatory Visit (INDEPENDENT_AMBULATORY_CARE_PROVIDER_SITE_OTHER): Payer: 59 | Admitting: Family

## 2014-01-15 ENCOUNTER — Encounter: Payer: Self-pay | Admitting: Family

## 2014-01-15 VITALS — BP 112/78 | HR 80 | Temp 98.2°F | Resp 16 | Ht 64.5 in | Wt 175.0 lb

## 2014-01-15 DIAGNOSIS — Z0000 Encounter for general adult medical examination without abnormal findings: Secondary | ICD-10-CM

## 2014-01-15 DIAGNOSIS — F418 Other specified anxiety disorders: Secondary | ICD-10-CM

## 2014-01-15 LAB — CBC WITH DIFFERENTIAL/PLATELET
BASOS ABS: 0.1 10*3/uL (ref 0.0–0.1)
Basophils Relative: 0.8 % (ref 0.0–3.0)
Eosinophils Absolute: 0 10*3/uL (ref 0.0–0.7)
Eosinophils Relative: 0.5 % (ref 0.0–5.0)
HEMATOCRIT: 37.6 % (ref 36.0–46.0)
HEMOGLOBIN: 12 g/dL (ref 12.0–15.0)
LYMPHS PCT: 26.6 % (ref 12.0–46.0)
Lymphs Abs: 1.8 10*3/uL (ref 0.7–4.0)
MCHC: 31.9 g/dL (ref 30.0–36.0)
MCV: 89.1 fl (ref 78.0–100.0)
MONO ABS: 0.4 10*3/uL (ref 0.1–1.0)
Monocytes Relative: 5.7 % (ref 3.0–12.0)
Neutro Abs: 4.4 10*3/uL (ref 1.4–7.7)
Neutrophils Relative %: 66.4 % (ref 43.0–77.0)
PLATELETS: 326 10*3/uL (ref 150.0–400.0)
RBC: 4.22 Mil/uL (ref 3.87–5.11)
RDW: 14 % (ref 11.5–15.5)
WBC: 6.7 10*3/uL (ref 4.0–10.5)

## 2014-01-15 LAB — HEPATIC FUNCTION PANEL
ALBUMIN: 4.6 g/dL (ref 3.5–5.2)
ALK PHOS: 58 U/L (ref 39–117)
ALT: 17 U/L (ref 0–35)
AST: 25 U/L (ref 0–37)
Bilirubin, Direct: 0 mg/dL (ref 0.0–0.3)
Total Bilirubin: 0.6 mg/dL (ref 0.2–1.2)
Total Protein: 8.4 g/dL — ABNORMAL HIGH (ref 6.0–8.3)

## 2014-01-15 LAB — LIPID PANEL
CHOLESTEROL: 178 mg/dL (ref 0–200)
HDL: 67 mg/dL (ref >39.00)
LDL CALC: 97 mg/dL (ref 0–99)
NonHDL: 111
TRIGLYCERIDES: 68 mg/dL (ref 0.0–149.0)
Total CHOL/HDL Ratio: 3
VLDL: 13.6 mg/dL (ref 0.0–40.0)

## 2014-01-15 LAB — BASIC METABOLIC PANEL
BUN: 14 mg/dL (ref 6–23)
CALCIUM: 9.5 mg/dL (ref 8.4–10.5)
CO2: 25 meq/L (ref 19–32)
CREATININE: 0.7 mg/dL (ref 0.4–1.2)
Chloride: 109 mEq/L (ref 96–112)
GFR: 129.85 mL/min (ref >60.00)
Glucose, Bld: 95 mg/dL (ref 70–99)
Potassium: 4.1 mEq/L (ref 3.5–5.1)
SODIUM: 142 meq/L (ref 135–145)

## 2014-01-15 LAB — TSH: TSH: 10.98 u[IU]/mL — AB (ref 0.35–4.50)

## 2014-01-15 MED ORDER — VALACYCLOVIR HCL 500 MG PO TABS: 500.0000 mg | ORAL_TABLET | Freq: Every day | ORAL | Status: DC | PRN

## 2014-01-15 MED ORDER — VENLAFAXINE HCL ER 37.5 MG PO CP24: ORAL_CAPSULE | ORAL | Status: DC

## 2014-01-15 MED ORDER — PANTOPRAZOLE SODIUM 40 MG PO TBEC: DELAYED_RELEASE_TABLET | ORAL | Status: DC

## 2014-01-15 MED ORDER — ESCITALOPRAM OXALATE 20 MG PO TABS: ORAL_TABLET | ORAL | Status: DC

## 2014-01-15 NOTE — Assessment & Plan Note (Signed)
Deteriorated. Continue lexapro, trial of effexor. Discussed counseling- she may see through her EAP at work.  15 minutes spent counseling pt on anxiety/depression.

## 2014-01-15 NOTE — Addendum Note (Signed)
Addended by: Peggyann Shoals on: 01/15/2014 01:30 PM   Modules accepted: Orders

## 2014-01-15 NOTE — Progress Notes (Signed)
Subjective:    Patient ID: Cheyenne Hawkins, female    DOB: 05-26-1975, 39 y.o.   MRN: [id number]  HPI  Patient presents today for complete physical.  Immunizations: flu and tetanus Diet: reports fair diet Exercise: got a fit bit for christmas Wt Readings from Last 3 Encounters:  01/15/14 175 lb (79.379 kg)  05/30/13 176 lb (79.833 kg)  04/27/13 175 lb 1.3 oz (79.416 kg)  Pap Smear:1.5 yrs ago (normal per pt-sees Dr Leo Grosser GYN) Mammogram: due- she will schedule.   Anxiety- reports increasing anxiety x 3-4 months. She is maintained on anxiety.  She has transitioned to a 5 day work week from a 3 day work week.  Struggles with this.  Not sleeping well.  Never feels relaxed.  Can't "turn it off."  Reports that she takes her lexapro 3-4 times a week, "when I remember."  Reports that for the week before her period she is "on edge."  Can't focus.  + tearfulness.   She has chronic slight milky breast discharge (only occurs if she tries to express the discharge) which has been worked up by [employer] with mammogram and reported normal prolactin level.    Review of Systems  Constitutional: Negative for unexpected weight change.  HENT: Negative for hearing loss and rhinorrhea.   Eyes: Negative for visual disturbance.  Respiratory: Negative for cough and shortness of breath.   Cardiovascular: Negative for chest pain.  Gastrointestinal: Negative for vomiting and diarrhea.       Constipation has improved  Genitourinary: Negative for dysuria and frequency.  Musculoskeletal: Negative for myalgias and arthralgias.  Skin: Negative for rash.  Neurological: Negative for headaches.  Hematological: Negative for adenopathy.  Psychiatric/Behavioral:       See hpi   Past Medical History  Diagnosis Date  . History of chicken pox   . GERD (gastroesophageal reflux disease)   . Heart murmur   . Grave's disease 2002    s/p thyroidectomy  . Fatty liver 03/21/2011  . HSV-2 (herpes simplex virus 2)  infection   . Depression 2012  . Anemia, iron deficiency 11/19/2011    History   Social History  . Marital Status: Married    Spouse Name: N/A    Number of Children: 3  . Years of Education: N/A   Occupational History  .  [employer]   Social History Main Topics  . Smoking status: Never Smoker   . Smokeless tobacco: Never Used  . Alcohol Use: 6.0 oz/week    10 Glasses of wine per week  . Drug Use: No  . Sexual Activity: Yes    Birth Control/ Protection: Surgical     Comment: btl   Other Topics Concern  . Not on file   Social History Narrative   Regular exercise:  No   Caffeine Use: 2 drinks weekly          Past Surgical History  Procedure Laterality Date  . Thyroidectomy  2003    for graves disease  . Cesarean section  2001  . Cesarean section  2003  . Cesarean section  2007  . Tubal ligation      Family History  Problem Relation Age of Onset  . Alcohol abuse Mother   . Depression Mother   . Bipolar disorder Mother   . Alcohol abuse Father   . Diabetes Father   . Hypertension Father   . Cancer Maternal Grandmother     colon  . Depression Maternal Grandmother   .  Diabetes Maternal Grandmother   . Hypertension Maternal Grandmother   . Cancer Paternal Grandmother     breast  . Diabetes Paternal Grandmother   . Hypertension Paternal Grandmother     No Known Allergies  Current Outpatient Prescriptions on File Prior to Visit  Medication Sig Dispense Refill  . bisacodyl (DULCOLAX) 5 MG EC tablet Take 5 mg by mouth daily as needed.    . cyclobenzaprine (FLEXERIL) 5 MG tablet One tablet by mouth at bedtime as needed. 14 tablet 0  . levothyroxine (SYNTHROID, LEVOTHROID) 150 MCG tablet Take 150 mcg by mouth daily.     . Multiple Vitamins-Minerals (MULTIVITAMIN WITH MINERALS) tablet Take 1 tablet by mouth daily.    . Polyethylene Glycol 3350 (MIRALAX PO) Take by mouth as needed.     No current facility-administered medications on file prior to visit.     BP 112/78 mmHg  Pulse 80  Temp(Src) 98.2 F (36.8 C) (Oral)  Resp 16  Ht 5' 4.5" (1.638 m)  Wt 175 lb (79.379 kg)  BMI 29.59 kg/m2  SpO2 100%  LMP 01/05/2014       Objective:   Physical Exam  Physical Exam  Constitutional: She is oriented to person, place, and time. She appears well-developed and well-nourished. No distress.  HENT:  Head: Normocephalic and atraumatic.  Right Ear: Tympanic membrane and ear canal normal.  Left Ear: Tympanic membrane and ear canal normal.  Eyes: Pupils are equal, round, and reactive to light. No scleral icterus.  Neck: Normal range of motion. No thyromegaly present.  Cardiovascular: Normal rate and regular rhythm.   No murmur heard. Pulmonary/Chest: Effort normal and breath sounds normal. No respiratory distress. He has no wheezes. She has no rales. She exhibits no tenderness.  Abdominal: Soft. Bowel sounds are normal. He exhibits no distension and no mass. There is no tenderness. There is no rebound and no guarding.  Musculoskeletal: She exhibits no edema.  Lymphadenopathy:    She has no cervical adenopathy.  Neurological: She is alert and oriented to person, place, and time.  She exhibits normal muscle tone. Coordination normal.  Skin: Skin is warm and dry.  Psychiatric: She has a normal mood and affect. Her behavior is normal. Judgment and thought content normal.  Breasts: Examined lying Right: Without masses, retractions, or axillary adenopathy. Able to express a small amount of white discharte Left: Without masses, retractions, discharge or axillary adenopathy.  Assessment & Plan:         Assessment & Plan:

## 2014-01-15 NOTE — Progress Notes (Signed)
Pre visit review using our clinic review tool, if applicable. No additional management support is needed unless otherwise documented below in the visit note. 

## 2014-01-15 NOTE — Assessment & Plan Note (Signed)
Immunizations reviewed and up to date. Obtain lab work. Pt will schedule mammogram, pap up to date per gyn. Advised her to speak again with GYN re: nipple discharge.

## 2014-01-15 NOTE — Patient Instructions (Signed)
Please complete lab work prior to leaving. Start effexor. Schedule your mammogram. Follow up in 1 month.  Happy New Year!

## 2014-01-16 ENCOUNTER — Other Ambulatory Visit: Payer: Self-pay | Admitting: Family

## 2014-01-16 DIAGNOSIS — E039 Hypothyroidism, unspecified: Secondary | ICD-10-CM

## 2014-01-16 NOTE — Telephone Encounter (Signed)
Please call pt and let her know that lab work shows synthroid needs to be elevated.  I would like for her to increase her synthroid to 175 and repeat tsh in 6 weeks please.

## 2014-01-18 MED ORDER — LEVOTHYROXINE SODIUM 175 MCG PO TABS: 175.0000 ug | ORAL_TABLET | Freq: Every day | ORAL | Status: DC

## 2014-01-18 NOTE — Telephone Encounter (Signed)
Notified pt and she voices understanding. Lab appt scheduled for 03/01/14 and future order has been entered. Rx sent to pharmacy.

## 2014-02-20 ENCOUNTER — Ambulatory Visit: Payer: 59 | Admitting: Family

## 2014-03-01 ENCOUNTER — Other Ambulatory Visit (INDEPENDENT_AMBULATORY_CARE_PROVIDER_SITE_OTHER): Payer: 59

## 2014-03-01 DIAGNOSIS — E039 Hypothyroidism, unspecified: Secondary | ICD-10-CM

## 2014-03-02 LAB — TSH: TSH: 0.922 u[IU]/mL (ref 0.350–4.500)

## 2014-03-05 ENCOUNTER — Encounter: Payer: Self-pay | Admitting: Family

## 2014-03-05 DIAGNOSIS — E039 Hypothyroidism, unspecified: Secondary | ICD-10-CM

## 2014-03-05 MED ORDER — LEVOTHYROXINE SODIUM 175 MCG PO TABS: 175.0000 ug | ORAL_TABLET | Freq: Every day | ORAL | Status: DC

## 2014-04-05 ENCOUNTER — Other Ambulatory Visit: Payer: Self-pay | Admitting: Family

## 2014-04-10 ENCOUNTER — Other Ambulatory Visit: Payer: Self-pay

## 2014-04-10 DIAGNOSIS — Z1231 Encounter for screening mammogram for malignant neoplasm of breast: Secondary | ICD-10-CM

## 2014-04-15 ENCOUNTER — Ambulatory Visit (INDEPENDENT_AMBULATORY_CARE_PROVIDER_SITE_OTHER): Payer: 59 | Admitting: Family

## 2014-04-15 ENCOUNTER — Ambulatory Visit: Payer: 59 | Admitting: Family

## 2014-04-15 ENCOUNTER — Ambulatory Visit
Admission: RE | Admit: 2014-04-15 | Discharge: 2014-04-15 | Disposition: A | Discharge status: Home / Self Care | Payer: 59 | Source: Ambulatory Visit

## 2014-04-15 ENCOUNTER — Encounter: Payer: Self-pay | Admitting: Family

## 2014-04-15 ENCOUNTER — Ambulatory Visit (HOSPITAL_BASED_OUTPATIENT_CLINIC_OR_DEPARTMENT_OTHER)
Admission: RE | Admit: 2014-04-15 | Discharge: 2014-04-15 | Disposition: A | Discharge status: Home / Self Care | Payer: 59 | Source: Ambulatory Visit | Attending: Family | Admitting: Family

## 2014-04-15 VITALS — BP 110/74 | HR 96 | Temp 97.8°F | Resp 14 | Ht 64.5 in | Wt 174.8 lb

## 2014-04-15 DIAGNOSIS — L989 Disorder of the skin and subcutaneous tissue, unspecified: Secondary | ICD-10-CM | POA: Diagnosis not present

## 2014-04-15 DIAGNOSIS — Z1231 Encounter for screening mammogram for malignant neoplasm of breast: Secondary | ICD-10-CM

## 2014-04-15 DIAGNOSIS — F418 Other specified anxiety disorders: Secondary | ICD-10-CM | POA: Diagnosis not present

## 2014-04-15 DIAGNOSIS — S6992XA Unspecified injury of left wrist, hand and finger(s), initial encounter: Secondary | ICD-10-CM

## 2014-04-15 DIAGNOSIS — W1839XA Other fall on same level, initial encounter: Secondary | ICD-10-CM | POA: Diagnosis not present

## 2014-04-15 DIAGNOSIS — S6990XA Unspecified injury of unspecified wrist, hand and finger(s), initial encounter: Secondary | ICD-10-CM | POA: Insufficient documentation

## 2014-04-15 DIAGNOSIS — R238 Other skin changes: Secondary | ICD-10-CM | POA: Insufficient documentation

## 2014-04-15 DIAGNOSIS — M79645 Pain in left finger(s): Secondary | ICD-10-CM | POA: Insufficient documentation

## 2014-04-15 MED ORDER — ESCITALOPRAM OXALATE 20 MG PO TABS: ORAL_TABLET | ORAL | Status: DC

## 2014-04-15 MED ORDER — VENLAFAXINE HCL ER 37.5 MG PO CP24: ORAL_CAPSULE | ORAL | Status: DC

## 2014-04-15 MED ORDER — PANTOPRAZOLE SODIUM 40 MG PO TBEC: DELAYED_RELEASE_TABLET | ORAL | Status: DC

## 2014-04-15 NOTE — Progress Notes (Signed)
Subjective:    Patient ID: Cheyenne Hawkins, female    DOB: Dec 29, 1975, 39 y.o.   MRN: [id number]  HPI   Ms.  [name] is a 39 yr old female who presents today for follow up.  Anxiety/depression-  Reports feeling well on lexapro and effexor.  Itching- reports that she has had some diffuse skin itching.  She started zyrtec 4-5 days ago with slight improvement.   Fell last night and injured left rigng finger and left hand swelling.   Review of Systems See HPI  Past Medical History  Diagnosis Date  . History of chicken pox   . GERD (gastroesophageal reflux disease)   . Heart murmur   . Grave's disease 2002    s/p thyroidectomy  . Fatty liver 03/21/2011  . HSV-2 (herpes simplex virus 2) infection   . Depression 2012  . Anemia, iron deficiency 11/19/2011    History   Social History  . Marital Status: Married    Spouse Name: N/A  . Number of Children: 3  . Years of Education: N/A   Occupational History  .  [employer]   Social History Main Topics  . Smoking status: Never Smoker   . Smokeless tobacco: Never Used  . Alcohol Use: 6.0 oz/week    10 Glasses of wine per week  . Drug Use: No  . Sexual Activity: Yes    Birth Control/ Protection: Surgical     Comment: btl   Other Topics Concern  . Not on file   Social History Narrative   Regular exercise:  No   Caffeine Use: 2 drinks weekly          Past Surgical History  Procedure Laterality Date  . Thyroidectomy  2003    for graves disease  . Cesarean section  2001  . Cesarean section  2003  . Cesarean section  2007  . Tubal ligation      Family History  Problem Relation Age of Onset  . Alcohol abuse Mother   . Depression Mother   . Bipolar disorder Mother   . Alcohol abuse Father   . Diabetes Father   . Hypertension Father   . Cancer Maternal Grandmother     colon  . Depression Maternal Grandmother   . Diabetes Maternal Grandmother   . Hypertension Maternal Grandmother   . Cancer Paternal  Grandmother     breast  . Diabetes Paternal Grandmother   . Hypertension Paternal Grandmother     No Known Allergies  Current Outpatient Prescriptions on File Prior to Visit  Medication Sig Dispense Refill  . bisacodyl (DULCOLAX) 5 MG EC tablet Take 5 mg by mouth daily as needed.    Marland Kitchen escitalopram (LEXAPRO) 20 MG tablet TAKE 1 TABLET (20 MG TOTAL) BY MOUTH DAILY. 90 tablet 1  . levothyroxine (SYNTHROID, LEVOTHROID) 175 MCG tablet TAKE 1 TABLET (175 MCG) BY MOUTH DAILY BEFORE BREAKFAST. 30 tablet 5  . Multiple Vitamins-Minerals (MULTIVITAMIN WITH MINERALS) tablet Take 1 tablet by mouth daily.    . pantoprazole (PROTONIX) 40 MG tablet TAKE 1 TABLET BY MOUTH DAILY. 90 tablet 1  . Polyethylene Glycol 3350 (MIRALAX PO) Take by mouth as needed.    . valACYclovir (VALTREX) 500 MG tablet Take 1 tablet (500 mg total) by mouth daily as needed. 90 tablet 1  . venlafaxine XR (EFFEXOR XR) 37.5 MG 24 hr capsule One tab by mouth once daily for 3 days, then increase to 2 tabs once daily 60 capsule 0  No current facility-administered medications on file prior to visit.    BP 110/74 mmHg  Pulse 96  Temp(Src) 97.8 F (36.6 C) (Oral)  Resp 14  Ht 5' 4.5" (1.638 m)  Wt 174 lb 12.8 oz (79.289 kg)  BMI 29.55 kg/m2  SpO2 98%  LMP 03/26/2014       Objective:   Physical Exam  Constitutional: She is oriented to person, place, and time. She appears well-developed and well-nourished.  Cardiovascular: Normal rate and regular rhythm.   No murmur heard. Pulmonary/Chest: Effort normal and breath sounds normal. No respiratory distress. She has no wheezes. She has no rales. She exhibits no tenderness.  Musculoskeletal:  Left ring finger swollen MIP and some superficial swelling near PIP  Lymphadenopathy:    She has no cervical adenopathy.  Neurological: She is alert and oriented to person, place, and time.  Skin: Skin is warm and dry.  Psychiatric: She has a normal mood and affect. Her behavior is  normal. Judgment and thought content normal.          Assessment & Plan:

## 2014-04-15 NOTE — Assessment & Plan Note (Signed)
Recommend that she try all fragrance free products.  Continue prn zyrtec, call if symptoms worsen or do not improve.

## 2014-04-15 NOTE — Assessment & Plan Note (Signed)
Stable on lexapro and effexor.

## 2014-04-15 NOTE — Progress Notes (Signed)
Pre visit review using our clinic review tool, if applicable. No additional management support is needed unless otherwise documented below in the visit note. 

## 2014-04-15 NOTE — Patient Instructions (Signed)
Please complete x ray on the first floor.  Continue zyrtec.  Eliminate scented detergents, body wash, lotions. Use Free and Clear products.   Call if itching worsens or if it does not improve. Follow up in 6 months sooner if needed.

## 2014-04-15 NOTE — Assessment & Plan Note (Signed)
Will obtain x ray to rule out fracture.  

## 2015-02-03 MED FILL — LEVOTHYROXINE 175 MCG TAB: 175 | 30 days supply | Qty: 30 | Fill #1

## 2015-02-28 ENCOUNTER — Emergency Department (HOSPITAL_BASED_OUTPATIENT_CLINIC_OR_DEPARTMENT_OTHER): Payer: 59

## 2015-02-28 ENCOUNTER — Emergency Department (HOSPITAL_BASED_OUTPATIENT_CLINIC_OR_DEPARTMENT_OTHER)
Admission: EM | Admit: 2015-02-28 | Discharge: 2015-02-28 | Disposition: A | Discharge status: Home / Self Care | Payer: 59 | Attending: Emergency Medicine | Admitting: Emergency Medicine

## 2015-02-28 ENCOUNTER — Encounter (HOSPITAL_BASED_OUTPATIENT_CLINIC_OR_DEPARTMENT_OTHER): Payer: Self-pay | Admitting: *Deleted

## 2015-02-28 DIAGNOSIS — R011 Cardiac murmur, unspecified: Secondary | ICD-10-CM | POA: Insufficient documentation

## 2015-02-28 DIAGNOSIS — Y92410 Unspecified street and highway as the place of occurrence of the external cause: Secondary | ICD-10-CM | POA: Insufficient documentation

## 2015-02-28 DIAGNOSIS — S29002A Unspecified injury of muscle and tendon of back wall of thorax, initial encounter: Secondary | ICD-10-CM | POA: Diagnosis not present

## 2015-02-28 DIAGNOSIS — S3991XA Unspecified injury of abdomen, initial encounter: Secondary | ICD-10-CM | POA: Insufficient documentation

## 2015-02-28 DIAGNOSIS — Y9389 Activity, other specified: Secondary | ICD-10-CM | POA: Insufficient documentation

## 2015-02-28 DIAGNOSIS — R52 Pain, unspecified: Secondary | ICD-10-CM | POA: Diagnosis not present

## 2015-02-28 DIAGNOSIS — Y998 Other external cause status: Secondary | ICD-10-CM | POA: Diagnosis not present

## 2015-02-28 DIAGNOSIS — M542 Cervicalgia: Secondary | ICD-10-CM | POA: Diagnosis not present

## 2015-02-28 DIAGNOSIS — K219 Gastro-esophageal reflux disease without esophagitis: Secondary | ICD-10-CM | POA: Insufficient documentation

## 2015-02-28 DIAGNOSIS — Z8619 Personal history of other infectious and parasitic diseases: Secondary | ICD-10-CM | POA: Insufficient documentation

## 2015-02-28 DIAGNOSIS — T148 Other injury of unspecified body region: Secondary | ICD-10-CM | POA: Diagnosis not present

## 2015-02-28 DIAGNOSIS — S161XXA Strain of muscle, fascia and tendon at neck level, initial encounter: Secondary | ICD-10-CM | POA: Insufficient documentation

## 2015-02-28 DIAGNOSIS — Z79899 Other long term (current) drug therapy: Secondary | ICD-10-CM | POA: Diagnosis not present

## 2015-02-28 DIAGNOSIS — F329 Major depressive disorder, single episode, unspecified: Secondary | ICD-10-CM | POA: Diagnosis not present

## 2015-02-28 DIAGNOSIS — Z8639 Personal history of other endocrine, nutritional and metabolic disease: Secondary | ICD-10-CM | POA: Insufficient documentation

## 2015-02-28 DIAGNOSIS — Z862 Personal history of diseases of the blood and blood-forming organs and certain disorders involving the immune mechanism: Secondary | ICD-10-CM | POA: Diagnosis not present

## 2015-02-28 DIAGNOSIS — S0990XA Unspecified injury of head, initial encounter: Secondary | ICD-10-CM | POA: Diagnosis not present

## 2015-02-28 DIAGNOSIS — S199XXA Unspecified injury of neck, initial encounter: Secondary | ICD-10-CM | POA: Diagnosis not present

## 2015-02-28 MED ORDER — METHOCARBAMOL 500 MG PO TABS
500.0000 mg | ORAL_TABLET | Freq: Once | ORAL | Status: AC
  Administered 2015-02-28: 500 mg via ORAL
  Filled 2015-02-28: qty 1

## 2015-02-28 MED ORDER — METHOCARBAMOL 500 MG PO TABS: 500.0000 mg | ORAL_TABLET | Freq: Three times a day (TID) | ORAL | Status: DC | PRN

## 2015-02-28 MED ORDER — HYDROCODONE-ACETAMINOPHEN 5-325 MG PO TABS
1.0000 | ORAL_TABLET | Freq: Once | ORAL | Status: DC
  Filled 2015-02-28: qty 1

## 2015-02-28 MED ORDER — OXYCODONE-ACETAMINOPHEN 5-325 MG PO TABS
1.0000 | ORAL_TABLET | Freq: Once | ORAL | Status: AC
  Administered 2015-02-28: 1 via ORAL
  Filled 2015-02-28: qty 1

## 2015-02-28 MED ORDER — IBUPROFEN 400 MG PO TABS
600.0000 mg | ORAL_TABLET | Freq: Once | ORAL | Status: AC
  Administered 2015-02-28: 600 mg via ORAL
  Filled 2015-02-28: qty 1

## 2015-02-28 MED ORDER — IBUPROFEN 600 MG PO TABS: 600.0000 mg | ORAL_TABLET | Freq: Three times a day (TID) | ORAL | Status: DC | PRN
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
## 2015-03-06 ENCOUNTER — Ambulatory Visit (INDEPENDENT_AMBULATORY_CARE_PROVIDER_SITE_OTHER): Payer: 59 | Admitting: Medical

## 2015-03-06 ENCOUNTER — Encounter (INDEPENDENT_AMBULATORY_CARE_PROVIDER_SITE_OTHER): Payer: Self-pay

## 2015-03-06 ENCOUNTER — Encounter: Payer: Self-pay | Admitting: Medical

## 2015-03-06 ENCOUNTER — Ambulatory Visit (HOSPITAL_BASED_OUTPATIENT_CLINIC_OR_DEPARTMENT_OTHER)
Admission: RE | Admit: 2015-03-06 | Discharge: 2015-03-06 | Disposition: A | Discharge status: Home / Self Care | Payer: 59 | Source: Ambulatory Visit | Attending: Medical | Admitting: Medical

## 2015-03-06 ENCOUNTER — Telehealth: Payer: Self-pay | Admitting: Medical

## 2015-03-06 VITALS — BP 112/76 | HR 87 | Temp 98.1°F | Ht 64.5 in | Wt 191.0 lb

## 2015-03-06 DIAGNOSIS — M546 Pain in thoracic spine: Secondary | ICD-10-CM | POA: Insufficient documentation

## 2015-03-06 DIAGNOSIS — M545 Low back pain, unspecified: Secondary | ICD-10-CM

## 2015-03-06 DIAGNOSIS — M4186 Other forms of scoliosis, lumbar region: Secondary | ICD-10-CM | POA: Diagnosis not present

## 2015-03-06 DIAGNOSIS — S3992XA Unspecified injury of lower back, initial encounter: Secondary | ICD-10-CM | POA: Diagnosis not present

## 2015-03-06 MED ORDER — DICLOFENAC SODIUM 75 MG PO TBEC: 75.0000 mg | DELAYED_RELEASE_TABLET | Freq: Two times a day (BID) | ORAL | Status: DC

## 2015-03-06 MED ORDER — CYCLOBENZAPRINE HCL 10 MG PO TABS: 10.0000 mg | ORAL_TABLET | Freq: Every day | ORAL | Status: DC

## 2015-03-06 MED ORDER — HYDROCODONE-ACETAMINOPHEN 5-325 MG PO TABS: 1.0000 | ORAL_TABLET | Freq: Four times a day (QID) | ORAL | Status: DC | PRN

## 2015-03-06 MED FILL — CYCLOBENZAPRINE 10 MG TAB: 10 | 10 days supply | Qty: 10 | Fill #0

## 2015-03-06 MED FILL — DICLOFENAC SOD EC 75 MG TAB: 75 | 15 days supply | Qty: 30 | Fill #0

## 2015-03-06 NOTE — Progress Notes (Signed)
Pre visit review using our clinic review tool, if applicable. No additional management support is needed unless otherwise documented below in the visit note. 
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
## 2015-03-06 NOTE — Patient Instructions (Addendum)
For your back pain and neck pain  Rx diclofenac nsaid. Stop ibuprofen. For muscle spams use flexeril at night. Stop robaxin. For severe pain rx norco.(rx advisement given).  Xray of tspine and lspine today.  If pain persist consider PT. Follow up in 7 days or as needed
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
## 2015-03-07 MED FILL — HYDROCODON-APAP 5-325: 5-325 | 3 days supply | Qty: 10 | Fill #0

## 2015-03-07 NOTE — Telephone Encounter (Signed)
Attempted to reach pt and VM is full. Will try to call later.

## 2015-03-10 NOTE — Telephone Encounter (Signed)
Did put in referral for pt to see chiropracter.

## 2015-03-10 NOTE — Telephone Encounter (Signed)
Spoke with pt and she wants a referral to a chiropractor. She is not sure that Physical Therapy will help so that is why she wants to go with a chiropractor. She does not feel that she will need another OV at this point but appointment was offered. Please advise.

## 2015-03-19 ENCOUNTER — Telehealth: Payer: Self-pay | Admitting: Family

## 2015-03-19 NOTE — Telephone Encounter (Signed)
Caller name: Self  Can be reached: [phone]   Reason for call: Patient request to have labs drawn so that she can get her SYNTHROID, LEVOTHROID refilled

## 2015-03-20 NOTE — Telephone Encounter (Signed)
CPE scheduled for 5/2

## 2015-03-20 NOTE — Telephone Encounter (Signed)
Pt is due for routine follow-up w/ Melissa. Labs can be completed at that time.

## 2015-03-28 ENCOUNTER — Other Ambulatory Visit: Payer: 59

## 2015-03-28 ENCOUNTER — Other Ambulatory Visit (INDEPENDENT_AMBULATORY_CARE_PROVIDER_SITE_OTHER): Payer: 59

## 2015-03-28 ENCOUNTER — Telehealth: Payer: Self-pay | Admitting: Family

## 2015-03-28 DIAGNOSIS — E039 Hypothyroidism, unspecified: Secondary | ICD-10-CM | POA: Diagnosis not present

## 2015-03-28 LAB — TSH: TSH: 5.53 u[IU]/mL — ABNORMAL HIGH (ref 0.35–4.50)

## 2015-03-28 MED ORDER — LEVOTHYROXINE SODIUM 175 MCG PO TABS: ORAL_TABLET | ORAL | Status: DC

## 2015-03-28 MED FILL — LEVOTHYROXINE 175 MCG TAB: 175 | 30 days supply | Qty: 30 | Fill #0

## 2015-03-28 NOTE — Telephone Encounter (Signed)
Rx sent, lab order placed.  I would like her to also schedule an office visit for follow up please.

## 2015-03-28 NOTE — Telephone Encounter (Signed)
Relation to [id number] Call back number:[phone] Pharmacy: [facility] [place]  Reason for call:  Patient would like to schedule labs today to  check her TSH and states she's completely out of her levothyroxine (SYNTHROID, LEVOTHROID) 175 MCG tablet

## 2015-03-28 NOTE — Telephone Encounter (Signed)
Patient scheduled labs for Monday 03/31/15 patient has a scheduled physical for 05/13/15 is that ok?

## 2015-03-31 ENCOUNTER — Other Ambulatory Visit: Payer: 59

## 2015-03-31 NOTE — Telephone Encounter (Signed)
Ok

## 2015-04-07 NOTE — Progress Notes (Signed)
Noted and agree. 

## 2015-05-07 MED FILL — LEVOTHYROXINE 175 MCG TAB: 175 | 30 days supply | Qty: 30 | Fill #1

## 2015-05-13 ENCOUNTER — Encounter: Payer: Self-pay | Admitting: Family

## 2015-05-13 ENCOUNTER — Ambulatory Visit (INDEPENDENT_AMBULATORY_CARE_PROVIDER_SITE_OTHER): Payer: 59 | Admitting: Family

## 2015-05-13 VITALS — BP 114/74 | HR 84 | Temp 98.2°F | Resp 16 | Ht 65.0 in | Wt 187.6 lb

## 2015-05-13 DIAGNOSIS — Z0000 Encounter for general adult medical examination without abnormal findings: Secondary | ICD-10-CM

## 2015-05-13 DIAGNOSIS — F418 Other specified anxiety disorders: Secondary | ICD-10-CM | POA: Diagnosis not present

## 2015-05-13 DIAGNOSIS — K589 Irritable bowel syndrome without diarrhea: Secondary | ICD-10-CM | POA: Diagnosis not present

## 2015-05-13 DIAGNOSIS — Z0001 Encounter for general adult medical examination with abnormal findings: Secondary | ICD-10-CM | POA: Diagnosis not present

## 2015-05-13 DIAGNOSIS — E039 Hypothyroidism, unspecified: Secondary | ICD-10-CM

## 2015-05-13 DIAGNOSIS — Z114 Encounter for screening for human immunodeficiency virus [HIV]: Secondary | ICD-10-CM

## 2015-05-13 DIAGNOSIS — Z1159 Encounter for screening for other viral diseases: Secondary | ICD-10-CM | POA: Diagnosis not present

## 2015-05-13 LAB — URINALYSIS, ROUTINE W REFLEX MICROSCOPIC
Bilirubin Urine: NEGATIVE
HGB URINE DIPSTICK: NEGATIVE
Ketones, ur: NEGATIVE
Leukocytes, UA: NEGATIVE
NITRITE: NEGATIVE
RBC / HPF: NONE SEEN (ref 0–?)
SPECIFIC GRAVITY, URINE: 1.015 (ref 1.000–1.030)
Total Protein, Urine: NEGATIVE
URINE GLUCOSE: NEGATIVE
Urobilinogen, UA: 0.2 (ref 0.0–1.0)
WBC UA: NONE SEEN (ref 0–?)
pH: 6 (ref 5.0–8.0)

## 2015-05-13 LAB — HEPATIC FUNCTION PANEL
ALT: 17 U/L (ref 0–35)
AST: 20 U/L (ref 0–37)
Albumin: 4 g/dL (ref 3.5–5.2)
Alkaline Phosphatase: 69 U/L (ref 39–117)
BILIRUBIN DIRECT: 0.1 mg/dL (ref 0.0–0.3)
BILIRUBIN TOTAL: 0.5 mg/dL (ref 0.2–1.2)
Total Protein: 7.6 g/dL (ref 6.0–8.3)

## 2015-05-13 LAB — TSH: TSH: 0.5 u[IU]/mL (ref 0.35–4.50)

## 2015-05-13 LAB — CBC WITH DIFFERENTIAL/PLATELET
BASOS PCT: 0.5 % (ref 0.0–3.0)
Basophils Absolute: 0 10*3/uL (ref 0.0–0.1)
EOS PCT: 0.7 % (ref 0.0–5.0)
Eosinophils Absolute: 0 10*3/uL (ref 0.0–0.7)
HCT: 33.9 % — ABNORMAL LOW (ref 36.0–46.0)
Hemoglobin: 11.1 g/dL — ABNORMAL LOW (ref 12.0–15.0)
LYMPHS ABS: 1.7 10*3/uL (ref 0.7–4.0)
Lymphocytes Relative: 24.1 % (ref 12.0–46.0)
MCHC: 32.8 g/dL (ref 30.0–36.0)
MCV: 82.4 fl (ref 78.0–100.0)
MONO ABS: 0.6 10*3/uL (ref 0.1–1.0)
Monocytes Relative: 7.9 % (ref 3.0–12.0)
NEUTROS PCT: 66.8 % (ref 43.0–77.0)
Neutro Abs: 4.8 10*3/uL (ref 1.4–7.7)
PLATELETS: 333 10*3/uL (ref 150.0–400.0)
RBC: 4.12 Mil/uL (ref 3.87–5.11)
RDW: 16.7 % — AB (ref 11.5–15.5)
WBC: 7.2 10*3/uL (ref 4.0–10.5)

## 2015-05-13 LAB — LIPID PANEL
CHOLESTEROL: 127 mg/dL (ref 0–200)
HDL: 56.1 mg/dL (ref >39.00)
LDL CALC: 61 mg/dL (ref 0–99)
NonHDL: 70.94
TRIGLYCERIDES: 50 mg/dL (ref 0.0–149.0)
Total CHOL/HDL Ratio: 2
VLDL: 10 mg/dL (ref 0.0–40.0)

## 2015-05-13 LAB — BASIC METABOLIC PANEL
BUN: 10 mg/dL (ref 6–23)
CO2: 27 mEq/L (ref 19–32)
CREATININE: 0.54 mg/dL (ref 0.40–1.20)
Calcium: 9.1 mg/dL (ref 8.4–10.5)
Chloride: 101 mEq/L (ref 96–112)
GFR: 159.77 mL/min (ref >60.00)
Glucose, Bld: 102 mg/dL — ABNORMAL HIGH (ref 70–99)
POTASSIUM: 3.8 meq/L (ref 3.5–5.1)
Sodium: 134 mEq/L — ABNORMAL LOW (ref 135–145)

## 2015-05-13 MED ORDER — VALACYCLOVIR HCL 500 MG PO TABS: 500.0000 mg | ORAL_TABLET | Freq: Every day | ORAL | Status: DC | PRN

## 2015-05-13 MED ORDER — ESCITALOPRAM OXALATE 20 MG PO TABS: ORAL_TABLET | ORAL | Status: DC

## 2015-05-13 MED ORDER — VENLAFAXINE HCL ER 37.5 MG PO CP24: ORAL_CAPSULE | ORAL | Status: DC

## 2015-05-13 MED ORDER — LINACLOTIDE 290 MCG PO CAPS: 290.0000 ug | ORAL_CAPSULE | Freq: Every day | ORAL | Status: DC

## 2015-05-13 MED ORDER — LEVOTHYROXINE SODIUM 175 MCG PO TABS: ORAL_TABLET | ORAL | Status: DC

## 2015-05-13 MED FILL — LINZESS 290 MCG CAPSULE: 290 | 90 days supply | Qty: 90 | Fill #0

## 2015-05-13 MED FILL — VENLAFAXINE HCL ER 37.5 MG: 37.5 | 90 days supply | Qty: 180 | Fill #0

## 2015-05-13 MED FILL — ESCITALOPRAM 20 MG TABLET: 20 | 90 days supply | Qty: 90 | Fill #0

## 2015-05-13 NOTE — Patient Instructions (Signed)
Please complete lab work prior to leaving. Follow up in 6 months.   Preventive Care for Adults, Female A healthy lifestyle and preventive care can promote health and wellness. Preventive health guidelines for women include the following key practices.  A routine yearly physical is a good way to check with your health care provider about your health and preventive screening. It is a chance to share any concerns and updates on your health and to receive a thorough exam.  Visit your dentist for a routine exam and preventive care every 6 months. Brush your teeth twice a day and floss once a day. Good oral hygiene prevents tooth decay and gum disease.  The frequency of eye exams is based on your age, health, family medical history, use of contact lenses, and other factors. Follow your health care provider's recommendations for frequency of eye exams.  Eat a healthy diet. Foods like vegetables, fruits, whole grains, low-fat dairy products, and lean protein foods contain the nutrients you need without too many calories. Decrease your intake of foods high in solid fats, added sugars, and salt. Eat the right amount of calories for you.Get information about a proper diet from your health care provider, if necessary.  Regular physical exercise is one of the most important things you can do for your health. Most adults should get at least 150 minutes of moderate-intensity exercise (any activity that increases your heart rate and causes you to sweat) each week. In addition, most adults need muscle-strengthening exercises on 2 or more days a week.  Maintain a healthy weight. The body mass index (BMI) is a screening tool to identify possible weight problems. It provides an estimate of body fat based on height and weight. Your health care provider can find your BMI and can help you achieve or maintain a healthy weight.For adults 20 years and older:  A BMI below 18.5 is considered underweight.  A BMI of 18.5 to  24.9 is normal.  A BMI of 25 to 29.9 is considered overweight.  A BMI of 30 and above is considered obese.  Maintain normal blood lipids and cholesterol levels by exercising and minimizing your intake of saturated fat. Eat a balanced diet with plenty of fruit and vegetables. Blood tests for lipids and cholesterol should begin at age 73 and be repeated every 5 years. If your lipid or cholesterol levels are high, you are over 50, or you are at high risk for heart disease, you may need your cholesterol levels checked more frequently.Ongoing high lipid and cholesterol levels should be treated with medicines if diet and exercise are not working.  If you smoke, find out from your health care provider how to quit. If you do not use tobacco, do not start.  Lung cancer screening is recommended for adults aged 98-80 years who are at high risk for developing lung cancer because of a history of smoking. A yearly low-dose CT scan of the lungs is recommended for people who have at least a 30-pack-year history of smoking and are a current smoker or have quit within the past 15 years. A pack year of smoking is smoking an average of 1 pack of cigarettes a day for 1 year (for example: 1 pack a day for 30 years or 2 packs a day for 15 years). Yearly screening should continue until the smoker has stopped smoking for at least 15 years. Yearly screening should be stopped for people who develop a health problem that would prevent them from having lung  cancer treatment.  If you are pregnant, do not drink alcohol. If you are breastfeeding, be very cautious about drinking alcohol. If you are not pregnant and choose to drink alcohol, do not have more than 1 drink per day. One drink is considered to be 12 ounces (355 mL) of beer, 5 ounces (148 mL) of wine, or 1.5 ounces (44 mL) of liquor.  Avoid use of street drugs. Do not share needles with anyone. Ask for help if you need support or instructions about stopping the use of  drugs.  High blood pressure causes heart disease and increases the risk of stroke. Your blood pressure should be checked at least every 1 to 2 years. Ongoing high blood pressure should be treated with medicines if weight loss and exercise do not work.  If you are 55-79 years old, ask your health care provider if you should take aspirin to prevent strokes.  Diabetes screening is done by taking a blood sample to check your blood glucose level after you have not eaten for a certain period of time (fasting). If you are not overweight and you do not have risk factors for diabetes, you should be screened once every 3 years starting at age 45. If you are overweight or obese and you are 40-70 years of age, you should be screened for diabetes every year as part of your cardiovascular risk assessment.  Breast cancer screening is essential preventive care for women. You should practice "breast self-awareness." This means understanding the normal appearance and feel of your breasts and may include breast self-examination. Any changes detected, no matter how small, should be reported to a health care provider. Women in their 20s and 30s should have a clinical breast exam (CBE) by a health care provider as part of a regular health exam every 1 to 3 years. After age 40, women should have a CBE every year. Starting at age 40, women should consider having a mammogram (breast X-ray test) every year. Women who have a family history of breast cancer should talk to their health care provider about genetic screening. Women at a high risk of breast cancer should talk to their health care providers about having an MRI and a mammogram every year.  Breast cancer gene (BRCA)-related cancer risk assessment is recommended for women who have family members with BRCA-related cancers. BRCA-related cancers include breast, ovarian, tubal, and peritoneal cancers. Having family members with these cancers may be associated with an increased  risk for harmful changes (mutations) in the breast cancer genes BRCA1 and BRCA2. Results of the assessment will determine the need for genetic counseling and BRCA1 and BRCA2 testing.  Your health care provider may recommend that you be screened regularly for cancer of the pelvic organs (ovaries, uterus, and vagina). This screening involves a pelvic examination, including checking for microscopic changes to the surface of your cervix (Pap test). You may be encouraged to have this screening done every 3 years, beginning at age 21.  For women ages 30-65, health care providers may recommend pelvic exams and Pap testing every 3 years, or they may recommend the Pap and pelvic exam, combined with testing for human papilloma virus (HPV), every 5 years. Some types of HPV increase your risk of cervical cancer. Testing for HPV may also be done on women of any age with unclear Pap test results.  Other health care providers may not recommend any screening for nonpregnant women who are considered low risk for pelvic cancer and who do not have   symptoms. Ask your health care provider if a screening pelvic exam is right for you.  If you have had past treatment for cervical cancer or a condition that could lead to cancer, you need Pap tests and screening for cancer for at least 20 years after your treatment. If Pap tests have been discontinued, your risk factors (such as having a new sexual partner) need to be reassessed to determine if screening should resume. Some women have medical problems that increase the chance of getting cervical cancer. In these cases, your health care provider may recommend more frequent screening and Pap tests.  Colorectal cancer can be detected and often prevented. Most routine colorectal cancer screening begins at the age of 50 years and continues through age 75 years. However, your health care provider may recommend screening at an earlier age if you have risk factors for colon cancer. On a  yearly basis, your health care provider may provide home test kits to check for hidden blood in the stool. Use of a small camera at the end of a tube, to directly examine the colon (sigmoidoscopy or colonoscopy), can detect the earliest forms of colorectal cancer. Talk to your health care provider about this at age 50, when routine screening begins. Direct exam of the colon should be repeated every 5-10 years through age 75 years, unless early forms of precancerous polyps or small growths are found.  People who are at an increased risk for hepatitis B should be screened for this virus. You are considered at high risk for hepatitis B if:  You were born in a country where hepatitis B occurs often. Talk with your health care provider about which countries are considered high risk.  Your parents were born in a high-risk country and you have not received a shot to protect against hepatitis B (hepatitis B vaccine).  You have HIV or AIDS.  You use needles to inject street drugs.  You live with, or have sex with, someone who has hepatitis B.  You get hemodialysis treatment.  You take certain medicines for conditions like cancer, organ transplantation, and autoimmune conditions.  Hepatitis C blood testing is recommended for all people born from 1945 through 1965 and any individual with known risks for hepatitis C.  Practice safe sex. Use condoms and avoid high-risk sexual practices to reduce the spread of sexually transmitted infections (STIs). STIs include gonorrhea, chlamydia, syphilis, trichomonas, herpes, HPV, and human immunodeficiency virus (HIV). Herpes, HIV, and HPV are viral illnesses that have no cure. They can result in disability, cancer, and death.  You should be screened for sexually transmitted illnesses (STIs) including gonorrhea and chlamydia if:  You are sexually active and are younger than 24 years.  You are older than 24 years and your health care provider tells you that you are  at risk for this type of infection.  Your sexual activity has changed since you were last screened and you are at an increased risk for chlamydia or gonorrhea. Ask your health care provider if you are at risk.  If you are at risk of being infected with HIV, it is recommended that you take a prescription medicine daily to prevent HIV infection. This is called preexposure prophylaxis (PrEP). You are considered at risk if:  You are sexually active and do not regularly use condoms or know the HIV status of your partner(s).  You take drugs by injection.  You are sexually active with a partner who has HIV.  Talk with your health care   provider about whether you are at high risk of being infected with HIV. If you choose to begin PrEP, you should first be tested for HIV. You should then be tested every 3 months for as long as you are taking PrEP.  Osteoporosis is a disease in which the bones lose minerals and strength with aging. This can result in serious bone fractures or breaks. The risk of osteoporosis can be identified using a bone density scan. Women ages 65 years and over and women at risk for fractures or osteoporosis should discuss screening with their health care providers. Ask your health care provider whether you should take a calcium supplement or vitamin D to reduce the rate of osteoporosis.  Menopause can be associated with physical symptoms and risks. Hormone replacement therapy is available to decrease symptoms and risks. You should talk to your health care provider about whether hormone replacement therapy is right for you.  Use sunscreen. Apply sunscreen liberally and repeatedly throughout the day. You should seek shade when your shadow is shorter than you. Protect yourself by wearing long sleeves, pants, a wide-brimmed hat, and sunglasses year round, whenever you are outdoors.  Once a month, do a whole body skin exam, using a mirror to look at the skin on your back. Tell your health  care provider of new moles, moles that have irregular borders, moles that are larger than a pencil eraser, or moles that have changed in shape or color.  Stay current with required vaccines (immunizations).  Influenza vaccine. All adults should be immunized every year.  Tetanus, diphtheria, and acellular pertussis (Td, Tdap) vaccine. Pregnant women should receive 1 dose of Tdap vaccine during each pregnancy. The dose should be obtained regardless of the length of time since the last dose. Immunization is preferred during the 27th-36th week of gestation. An adult who has not previously received Tdap or who does not know her vaccine status should receive 1 dose of Tdap. This initial dose should be followed by tetanus and diphtheria toxoids (Td) booster doses every 10 years. Adults with an unknown or incomplete history of completing a 3-dose immunization series with Td-containing vaccines should begin or complete a primary immunization series including a Tdap dose. Adults should receive a Td booster every 10 years.  Varicella vaccine. An adult without evidence of immunity to varicella should receive 2 doses or a second dose if she has previously received 1 dose. Pregnant females who do not have evidence of immunity should receive the first dose after pregnancy. This first dose should be obtained before leaving the health care facility. The second dose should be obtained 4-8 weeks after the first dose.  Human papillomavirus (HPV) vaccine. Females aged 13-26 years who have not received the vaccine previously should obtain the 3-dose series. The vaccine is not recommended for use in pregnant females. However, pregnancy testing is not needed before receiving a dose. If a female is found to be pregnant after receiving a dose, no treatment is needed. In that case, the remaining doses should be delayed until after the pregnancy. Immunization is recommended for any person with an immunocompromised condition through  the age of 26 years if she did not get any or all doses earlier. During the 3-dose series, the second dose should be obtained 4-8 weeks after the first dose. The third dose should be obtained 24 weeks after the first dose and 16 weeks after the second dose.  Zoster vaccine. One dose is recommended for adults aged 60 years or older   unless certain conditions are present.  Measles, mumps, and rubella (MMR) vaccine. Adults born before [birth year] generally are considered immune to measles and mumps. Adults born in [birth year] or later should have 1 or more doses of MMR vaccine unless there is a contraindication to the vaccine or there is laboratory evidence of immunity to each of the three diseases. A routine second dose of MMR vaccine should be obtained at least 28 days after the first dose for students attending postsecondary schools, health care workers, or international travelers. People who received inactivated measles vaccine or an unknown type of measles vaccine during 1963-1967 should receive 2 doses of MMR vaccine. People who received inactivated mumps vaccine or an unknown type of mumps vaccine before 1979 and are at high risk for mumps infection should consider immunization with 2 doses of MMR vaccine. For females of childbearing age, rubella immunity should be determined. If there is no evidence of immunity, females who are not pregnant should be vaccinated. If there is no evidence of immunity, females who are pregnant should delay immunization until after pregnancy. Unvaccinated health care workers born before [birth year] who lack laboratory evidence of measles, mumps, or rubella immunity or laboratory confirmation of disease should consider measles and mumps immunization with 2 doses of MMR vaccine or rubella immunization with 1 dose of MMR vaccine.  Pneumococcal 13-valent conjugate (PCV13) vaccine. When indicated, a person who is uncertain of his immunization history and has no record of immunization should receive the  PCV13 vaccine. All adults 67 years of age and older should receive this vaccine. An adult aged 15 years or older who has certain medical conditions and has not been previously immunized should receive 1 dose of PCV13 vaccine. This PCV13 should be followed with a dose of pneumococcal polysaccharide (PPSV23) vaccine. Adults who are at high risk for pneumococcal disease should obtain the PPSV23 vaccine at least 8 weeks after the dose of PCV13 vaccine. Adults older than 41 years of age who have normal immune system function should obtain the PPSV23 vaccine dose at least 1 year after the dose of PCV13 vaccine.  Pneumococcal polysaccharide (PPSV23) vaccine. When PCV13 is also indicated, PCV13 should be obtained first. All adults aged 68 years and older should be immunized. An adult younger than age 24 years who has certain medical conditions should be immunized. Any person who resides in a nursing home or long-term care facility should be immunized. An adult smoker should be immunized. People with an immunocompromised condition and certain other conditions should receive both PCV13 and PPSV23 vaccines. People with human immunodeficiency virus (HIV) infection should be immunized as soon as possible after diagnosis. Immunization during chemotherapy or radiation therapy should be avoided. Routine use of PPSV23 vaccine is not recommended for American Indians, [place] Natives, or people younger than 65 years unless there are medical conditions that require PPSV23 vaccine. When indicated, people who have unknown immunization and have no record of immunization should receive PPSV23 vaccine. One-time revaccination 5 years after the first dose of PPSV23 is recommended for people aged 19-64 years who have chronic kidney failure, nephrotic syndrome, asplenia, or immunocompromised conditions. People who received 1-2 doses of PPSV23 before age 11 years should receive another dose of PPSV23 vaccine at age 25 years or later if at least  5 years have passed since the previous dose. Doses of PPSV23 are not needed for people immunized with PPSV23 at or after age 12 years.  Meningococcal vaccine. Adults with asplenia or persistent complement component deficiencies should  receive 2 doses of quadrivalent meningococcal conjugate (MenACWY-D) vaccine. The doses should be obtained at least 2 months apart. Microbiologists working with certain meningococcal bacteria, military recruits, people at risk during an outbreak, and people who travel to or live in countries with a high rate of meningitis should be immunized. A first-year college student up through age 21 years who is living in a residence hall should receive a dose if she did not receive a dose on or after her 16th birthday. Adults who have certain high-risk conditions should receive one or more doses of vaccine.  Hepatitis A vaccine. Adults who wish to be protected from this disease, have certain high-risk conditions, work with hepatitis A-infected animals, work in hepatitis A research labs, or travel to or work in countries with a high rate of hepatitis A should be immunized. Adults who were previously unvaccinated and who anticipate close contact with an international adoptee during the first 60 days after arrival in the [place] from a country with a high rate of hepatitis A should be immunized.  Hepatitis B vaccine. Adults who wish to be protected from this disease, have certain high-risk conditions, may be exposed to blood or other infectious body fluids, are household contacts or sex partners of hepatitis B positive people, are clients or workers in certain care facilities, or travel to or work in countries with a high rate of hepatitis B should be immunized.  Haemophilus influenzae type b (Hib) vaccine. A previously unvaccinated person with asplenia or sickle cell disease or having a scheduled splenectomy should receive 1 dose of Hib vaccine. Regardless of previous immunization, a  recipient of a hematopoietic stem cell transplant should receive a 3-dose series 6-12 months after her successful transplant. Hib vaccine is not recommended for adults with HIV infection. Preventive Services / Frequency Ages 19 to 39 years  Blood pressure check.** / Every 3-5 years.  Lipid and cholesterol check.** / Every 5 years beginning at age 20.  Clinical breast exam.** / Every 3 years for women in their 20s and 30s.  BRCA-related cancer risk assessment.** / For women who have family members with a BRCA-related cancer (breast, ovarian, tubal, or peritoneal cancers).  Pap test.** / Every 2 years from ages 21 through 29. Every 3 years starting at age 30 through age 65 or 70 with a history of 3 consecutive normal Pap tests.  HPV screening.** / Every 3 years from ages 30 through ages 65 to 70 with a history of 3 consecutive normal Pap tests.  Hepatitis C blood test.** / For any individual with known risks for hepatitis C.  Skin self-exam. / Monthly.  Influenza vaccine. / Every year.  Tetanus, diphtheria, and acellular pertussis (Tdap, Td) vaccine.** / Consult your health care provider. Pregnant women should receive 1 dose of Tdap vaccine during each pregnancy. 1 dose of Td every 10 years.  Varicella vaccine.** / Consult your health care provider. Pregnant females who do not have evidence of immunity should receive the first dose after pregnancy.  HPV vaccine. / 3 doses over 6 months, if 26 and younger. The vaccine is not recommended for use in pregnant females. However, pregnancy testing is not needed before receiving a dose.  Measles, mumps, rubella (MMR) vaccine.** / You need at least 1 dose of MMR if you were born in 1957 or later. You may also need a 2nd dose. For females of childbearing age, rubella immunity should be determined. If there is no evidence of immunity, females who are not   pregnant should be vaccinated. If there is no evidence of immunity, females who are pregnant  should delay immunization until after pregnancy.  Pneumococcal 13-valent conjugate (PCV13) vaccine.** / Consult your health care provider.  Pneumococcal polysaccharide (PPSV23) vaccine.** / 1 to 2 doses if you smoke cigarettes or if you have certain conditions.  Meningococcal vaccine.** / 1 dose if you are age 19 to 21 years and a first-year college student living in a residence hall, or have one of several medical conditions, you need to get vaccinated against meningococcal disease. You may also need additional booster doses.  Hepatitis A vaccine.** / Consult your health care provider.  Hepatitis B vaccine.** / Consult your health care provider.  Haemophilus influenzae type b (Hib) vaccine.** / Consult your health care provider. Ages 40 to 64 years  Blood pressure check.** / Every year.  Lipid and cholesterol check.** / Every 5 years beginning at age 20 years.  Lung cancer screening. / Every year if you are aged 55-80 years and have a 30-pack-year history of smoking and currently smoke or have quit within the past 15 years. Yearly screening is stopped once you have quit smoking for at least 15 years or develop a health problem that would prevent you from having lung cancer treatment.  Clinical breast exam.** / Every year after age 40 years.  BRCA-related cancer risk assessment.** / For women who have family members with a BRCA-related cancer (breast, ovarian, tubal, or peritoneal cancers).  Mammogram.** / Every year beginning at age 40 years and continuing for as long as you are in good health. Consult with your health care provider.  Pap test.** / Every 3 years starting at age 30 years through age 65 or 70 years with a history of 3 consecutive normal Pap tests.  HPV screening.** / Every 3 years from ages 30 years through ages 65 to 70 years with a history of 3 consecutive normal Pap tests.  Fecal occult blood test (FOBT) of stool. / Every year beginning at age 50 years and  continuing until age 75 years. You may not need to do this test if you get a colonoscopy every 10 years.  Flexible sigmoidoscopy or colonoscopy.** / Every 5 years for a flexible sigmoidoscopy or every 10 years for a colonoscopy beginning at age 50 years and continuing until age 75 years.  Hepatitis C blood test.** / For all people born from 1945 through 1965 and any individual with known risks for hepatitis C.  Skin self-exam. / Monthly.  Influenza vaccine. / Every year.  Tetanus, diphtheria, and acellular pertussis (Tdap/Td) vaccine.** / Consult your health care provider. Pregnant women should receive 1 dose of Tdap vaccine during each pregnancy. 1 dose of Td every 10 years.  Varicella vaccine.** / Consult your health care provider. Pregnant females who do not have evidence of immunity should receive the first dose after pregnancy.  Zoster vaccine.** / 1 dose for adults aged 60 years or older.  Measles, mumps, rubella (MMR) vaccine.** / You need at least 1 dose of MMR if you were born in 1957 or later. You may also need a second dose. For females of childbearing age, rubella immunity should be determined. If there is no evidence of immunity, females who are not pregnant should be vaccinated. If there is no evidence of immunity, females who are pregnant should delay immunization until after pregnancy.  Pneumococcal 13-valent conjugate (PCV13) vaccine.** / Consult your health care provider.  Pneumococcal polysaccharide (PPSV23) vaccine.** / 1 to 2 doses   if you smoke cigarettes or if you have certain conditions.  Meningococcal vaccine.** / Consult your health care provider.  Hepatitis A vaccine.** / Consult your health care provider.  Hepatitis B vaccine.** / Consult your health care provider.  Haemophilus influenzae type b (Hib) vaccine.** / Consult your health care provider. Ages 65 years and over  Blood pressure check.** / Every year.  Lipid and cholesterol check.** / Every 5 years  beginning at age 20 years.  Lung cancer screening. / Every year if you are aged 55-80 years and have a 30-pack-year history of smoking and currently smoke or have quit within the past 15 years. Yearly screening is stopped once you have quit smoking for at least 15 years or develop a health problem that would prevent you from having lung cancer treatment.  Clinical breast exam.** / Every year after age 40 years.  BRCA-related cancer risk assessment.** / For women who have family members with a BRCA-related cancer (breast, ovarian, tubal, or peritoneal cancers).  Mammogram.** / Every year beginning at age 40 years and continuing for as long as you are in good health. Consult with your health care provider.  Pap test.** / Every 3 years starting at age 30 years through age 65 or 70 years with 3 consecutive normal Pap tests. Testing can be stopped between 65 and 70 years with 3 consecutive normal Pap tests and no abnormal Pap or HPV tests in the past 10 years.  HPV screening.** / Every 3 years from ages 30 years through ages 65 or 70 years with a history of 3 consecutive normal Pap tests. Testing can be stopped between 65 and 70 years with 3 consecutive normal Pap tests and no abnormal Pap or HPV tests in the past 10 years.  Fecal occult blood test (FOBT) of stool. / Every year beginning at age 50 years and continuing until age 75 years. You may not need to do this test if you get a colonoscopy every 10 years.  Flexible sigmoidoscopy or colonoscopy.** / Every 5 years for a flexible sigmoidoscopy or every 10 years for a colonoscopy beginning at age 50 years and continuing until age 75 years.  Hepatitis C blood test.** / For all people born from 1945 through 1965 and any individual with known risks for hepatitis C.  Osteoporosis screening.** / A one-time screening for women ages 65 years and over and women at risk for fractures or osteoporosis.  Skin self-exam. / Monthly.  Influenza vaccine. /  Every year.  Tetanus, diphtheria, and acellular pertussis (Tdap/Td) vaccine.** / 1 dose of Td every 10 years.  Varicella vaccine.** / Consult your health care provider.  Zoster vaccine.** / 1 dose for adults aged 60 years or older.  Pneumococcal 13-valent conjugate (PCV13) vaccine.** / Consult your health care provider.  Pneumococcal polysaccharide (PPSV23) vaccine.** / 1 dose for all adults aged 65 years and older.  Meningococcal vaccine.** / Consult your health care provider.  Hepatitis A vaccine.** / Consult your health care provider.  Hepatitis B vaccine.** / Consult your health care provider.  Haemophilus influenzae type b (Hib) vaccine.** / Consult your health care provider. ** Family history and personal history of risk and conditions may change your health care provider's recommendations.   This information is not intended to replace advice given to you by your health care provider. Make sure you discuss any questions you have with your health care provider.   Document Released: 02/23/2001 Document Revised: 01/18/2014 Document Reviewed: 05/25/2010 Elsevier Interactive Patient Education 2016   [employer].

## 2015-05-13 NOTE — Assessment & Plan Note (Signed)
Uncontrolled. Will give a trial of Linzess. I directed her to their website to print a coupon card.

## 2015-05-13 NOTE — Progress Notes (Signed)
Pre visit review using our clinic review tool, if applicable. No additional management support is needed unless otherwise documented below in the visit note. 

## 2015-05-13 NOTE — Assessment & Plan Note (Signed)
Discussed healthy diet, exercise weight loss. Immunizations reviewed and up to date.  Refer for mammogram.

## 2015-05-13 NOTE — Progress Notes (Addendum)
Subjective:    Patient ID: Cheyenne Hawkins, female    DOB: 1975-03-01, 39 y.o.   MRN: [id number]  HPI  Ms. Cheyenne Hawkins is a 40 yr old female who presents today for cpx.  Immunizations: up to date Diet: could be better Exercise: some walking, 2 times a week Wt Readings from Last 3 Encounters:  05/13/15 187 lb 9.6 oz (85.095 kg)  03/06/15 191 lb (86.637 kg)  02/28/15 187 lb (84.823 kg)  Pap Smear: 2016 (GYN) Mammogram: due Dental: up to date Vision: up to date  Depression- reports that she continues effexor xr.  Ran out of lexapro. Notes that her mood is not as good as it was on the lexapro. Would like to restart. Reports that she and her husband have plans to separate.  Constipation- 2 BM's a week.  Has tried amitiza in the past which helped but was too expensive. Feels like food sits in her stomach and takes a long time to digest.   BP Readings from Last 3 Encounters:  05/13/15 114/74  03/06/15 112/76  02/28/15 132/85   Review of Systems  Constitutional: Negative for unexpected weight change.  HENT: Negative for rhinorrhea.   Respiratory: Negative for cough.   Cardiovascular:       Reports mild ankle swelling  Gastrointestinal: Positive for constipation.  Genitourinary: Negative for dysuria, frequency and menstrual problem.  Musculoskeletal: Negative for arthralgias.       Some back pain  Skin: Negative for rash.  Neurological: Negative for headaches.  Hematological: Negative for adenopathy.  Psychiatric/Behavioral:       See HPI   Past Medical History  Diagnosis Date  . History of chicken pox   . GERD (gastroesophageal reflux disease)   . Heart murmur   . Grave's disease 2002    s/p thyroidectomy  . Fatty liver 03/21/2011  . HSV-2 (herpes simplex virus 2) infection   . Depression 2012  . Anemia, iron deficiency 11/19/2011     Social History   Social History  . Marital Status: Married    Spouse Name: N/A  . Number of Children: 3  . Years of Education:  N/A   Occupational History  .  [employer]   Social History Main Topics  . Smoking status: Never Smoker   . Smokeless tobacco: Never Used  . Alcohol Use: 6.0 oz/week    10 Glasses of wine per week  . Drug Use: No  . Sexual Activity: Yes    Birth Control/ Protection: Surgical     Comment: btl   Other Topics Concern  . Not on file   Social History Narrative   Regular exercise:  No   Caffeine Use: 2 drinks weekly          Past Surgical History  Procedure Laterality Date  . Thyroidectomy  2003    for graves disease  . Cesarean section  2001  . Cesarean section  2003  . Cesarean section  2007  . Tubal ligation      Family History  Problem Relation Age of Onset  . Alcohol abuse Mother   . Depression Mother   . Bipolar disorder Mother   . Alcohol abuse Father   . Diabetes Father   . Hypertension Father   . Cancer Maternal Grandmother     colon  . Depression Maternal Grandmother   . Diabetes Maternal Grandmother   . Hypertension Maternal Grandmother   . Cancer Paternal Grandmother     breast  . Diabetes  Paternal Grandmother   . Hypertension Paternal Grandmother     No Known Allergies  Current Outpatient Prescriptions on File Prior to Visit  Medication Sig Dispense Refill  . Multiple Vitamins-Minerals (MULTIVITAMIN WITH MINERALS) tablet Take 1 tablet by mouth daily.    . pantoprazole (PROTONIX) 40 MG tablet TAKE 1 TABLET BY MOUTH DAILY. 90 tablet 1   No current facility-administered medications on file prior to visit.    BP 114/74 mmHg  Pulse 84  Temp(Src) 98.2 F (36.8 C) (Oral)  Resp 16  Ht 5\' 5"  (1.651 m)  Wt 187 lb 9.6 oz (85.095 kg)  BMI 31.22 kg/m2  SpO2 99%  LMP 04/17/2015       Objective:   Physical Exam  Physical Exam  Constitutional: She is oriented to person, place, and time. She appears well-developed and well-nourished. No distress.  HENT:  Head: Normocephalic and atraumatic.  Right Ear: Tympanic membrane and ear canal normal.    Left Ear: Tympanic membrane and ear canal normal.  Mouth/Throat: Oropharynx is clear and moist.  Eyes: Pupils are equal, round, and reactive to light. No scleral icterus.  Neck: Normal range of motion. No thyromegaly present.  Cardiovascular: Normal rate and regular rhythm.   No murmur heard. Pulmonary/Chest: Effort normal and breath sounds normal. No respiratory distress. He has no wheezes. She has no rales. She exhibits no tenderness.  Abdominal: Soft. Bowel sounds are normal. He exhibits no distension and no mass. There is no tenderness. There is no rebound and no guarding.  Musculoskeletal: She exhibits no edema.  Lymphadenopathy:    She has no cervical adenopathy.  Neurological: She is alert and oriented to person, place, and time. She has normal patellar reflexes. She exhibits normal muscle tone. Coordination normal.  Skin: Skin is warm and dry.  Psychiatric: She has a normal mood and affect. Her behavior is normal. Judgment and thought content normal.  Breasts: Examined lying Right: Without masses, retractions, discharge or axillary adenopathy.  Left: Without masses, retractions, discharge or axillary adenopathy.  Pelvic: deferred         Assessment & Plan:         Assessment & Plan:  EKG tracing is personally reviewed.  EKG notes ST elevation in Anterior leads. No previous EKG available for comparison. Will refer to cardiology  for further evaluation. See phone note.

## 2015-05-13 NOTE — Assessment & Plan Note (Signed)
Depression is deteriorated.  Continue effexor, resume lexapro.

## 2015-05-13 NOTE — Assessment & Plan Note (Signed)
Obtain follow up TSH.  

## 2015-05-14 ENCOUNTER — Other Ambulatory Visit (INDEPENDENT_AMBULATORY_CARE_PROVIDER_SITE_OTHER): Payer: 59

## 2015-05-14 DIAGNOSIS — D649 Anemia, unspecified: Secondary | ICD-10-CM | POA: Diagnosis not present

## 2015-05-14 LAB — IRON: Iron: 98 ug/dL (ref 42–145)

## 2015-05-14 LAB — HIV ANTIBODY (ROUTINE TESTING W REFLEX): HIV 1&2 Ab, 4th Generation: NONREACTIVE

## 2015-05-15 ENCOUNTER — Telehealth: Payer: Self-pay | Admitting: Family

## 2015-05-15 DIAGNOSIS — R9431 Abnormal electrocardiogram [ECG] [EKG]: Secondary | ICD-10-CM

## 2015-05-15 DIAGNOSIS — D649 Anemia, unspecified: Secondary | ICD-10-CM

## 2015-05-15 NOTE — Telephone Encounter (Signed)
Please let pt know that I reviewed the EKG which was done at her appointment. It is showing some abnormalities which could be normal for her, but I don't have old EKG to compare to.  I would like her to meet with cardiology for evaluation.

## 2015-05-15 NOTE — Telephone Encounter (Signed)
Also, please let her know lab work shows mild anemia. Is she takng MVI with minerals?

## 2015-05-15 NOTE — Addendum Note (Signed)
Addended by: Debbrah Alar on: 05/15/2015 11:34 AM   Modules accepted: Miquel Dunn

## 2015-05-16 ENCOUNTER — Other Ambulatory Visit: Payer: Self-pay | Admitting: Family

## 2015-05-16 DIAGNOSIS — E871 Hypo-osmolality and hyponatremia: Secondary | ICD-10-CM

## 2015-05-16 DIAGNOSIS — D649 Anemia, unspecified: Secondary | ICD-10-CM

## 2015-05-16 NOTE — Telephone Encounter (Signed)
Sodium is a little low. Please repeat bmet in 2 weeks. Dx hyponatremia.  Also, lets have her complete IFOB, dx anemia.

## 2015-05-16 NOTE — Telephone Encounter (Signed)
Has seen DR Doylene Canard in the past, had echo that showed MVP. Advised pt we will fax referral / EKG info to him and she can follow up with him. Pt voices understanding. She is unsure if multivitamin has minerals but she will check and get appropriate one if necessary. She will pick up IFOB kit from our office (placed at front desk). Lab appt scheduled for 05/28/15 at 10am.  Future orders entered.

## 2015-05-28 ENCOUNTER — Other Ambulatory Visit: Payer: 59

## 2015-06-13 MED FILL — LEVOTHYROXINE 175 MCG TAB: 175 | 90 days supply | Qty: 90 | Fill #0

## 2015-06-16 ENCOUNTER — Ambulatory Visit
Admission: RE | Admit: 2015-06-16 | Discharge: 2015-06-16 | Disposition: A | Discharge status: Home / Self Care | Payer: 59 | Source: Ambulatory Visit | Attending: Family | Admitting: Family

## 2015-06-16 DIAGNOSIS — Z0000 Encounter for general adult medical examination without abnormal findings: Secondary | ICD-10-CM

## 2015-06-16 DIAGNOSIS — Z1231 Encounter for screening mammogram for malignant neoplasm of breast: Secondary | ICD-10-CM | POA: Diagnosis not present

## 2015-06-19 ENCOUNTER — Other Ambulatory Visit (INDEPENDENT_AMBULATORY_CARE_PROVIDER_SITE_OTHER): Payer: 59

## 2015-06-19 DIAGNOSIS — D649 Anemia, unspecified: Secondary | ICD-10-CM

## 2015-06-19 LAB — FECAL OCCULT BLOOD, IMMUNOCHEMICAL: Fecal Occult Bld: NEGATIVE

## 2015-06-25 DIAGNOSIS — R002 Palpitations: Secondary | ICD-10-CM | POA: Diagnosis not present

## 2015-06-25 DIAGNOSIS — E034 Atrophy of thyroid (acquired): Secondary | ICD-10-CM | POA: Diagnosis not present

## 2015-06-25 DIAGNOSIS — I341 Nonrheumatic mitral (valve) prolapse: Secondary | ICD-10-CM | POA: Diagnosis not present

## 2015-08-05 DIAGNOSIS — R079 Chest pain, unspecified: Secondary | ICD-10-CM | POA: Diagnosis not present

## 2015-08-05 DIAGNOSIS — I341 Nonrheumatic mitral (valve) prolapse: Secondary | ICD-10-CM | POA: Diagnosis not present

## 2015-08-05 DIAGNOSIS — R002 Palpitations: Secondary | ICD-10-CM | POA: Diagnosis not present

## 2015-08-05 DIAGNOSIS — E039 Hypothyroidism, unspecified: Secondary | ICD-10-CM | POA: Diagnosis not present

## 2015-08-08 ENCOUNTER — Other Ambulatory Visit: Payer: Self-pay | Admitting: Family

## 2015-08-08 MED FILL — CARVEDILOL 3.125 MG TABLET: 3.125 | 30 days supply | Qty: 60 | Fill #0

## 2015-08-08 MED FILL — PANTOPRAZOLE SOD DR 40 MG T: 40 | 90 days supply | Qty: 90 | Fill #0

## 2015-08-12 ENCOUNTER — Ambulatory Visit (INDEPENDENT_AMBULATORY_CARE_PROVIDER_SITE_OTHER): Payer: 59 | Admitting: Family

## 2015-08-12 ENCOUNTER — Telehealth: Payer: Self-pay | Admitting: Family

## 2015-08-12 ENCOUNTER — Encounter: Payer: Self-pay | Admitting: Family

## 2015-08-12 VITALS — BP 118/82 | HR 84 | Temp 98.0°F | Resp 18 | Ht 65.0 in | Wt 185.4 lb

## 2015-08-12 DIAGNOSIS — G4700 Insomnia, unspecified: Secondary | ICD-10-CM

## 2015-08-12 DIAGNOSIS — R000 Tachycardia, unspecified: Secondary | ICD-10-CM | POA: Diagnosis not present

## 2015-08-12 DIAGNOSIS — E039 Hypothyroidism, unspecified: Secondary | ICD-10-CM

## 2015-08-12 DIAGNOSIS — K589 Irritable bowel syndrome without diarrhea: Secondary | ICD-10-CM | POA: Diagnosis not present

## 2015-08-12 LAB — TSH: TSH: 0.16 u[IU]/mL — ABNORMAL LOW (ref 0.35–4.50)

## 2015-08-12 MED ORDER — LEVOTHYROXINE SODIUM 150 MCG PO TABS: 150.0000 ug | ORAL_TABLET | Freq: Every day | ORAL | 2 refills | Status: DC

## 2015-08-12 MED ORDER — VALACYCLOVIR HCL 500 MG PO TABS: 500.0000 mg | ORAL_TABLET | Freq: Every day | ORAL | 0 refills | Status: DC | PRN

## 2015-08-12 MED ORDER — LINACLOTIDE 145 MCG PO CAPS
145.0000 ug | ORAL_CAPSULE | Freq: Every day | ORAL | 2 refills | Status: DC
Start: 2015-08-12 — End: 2015-11-24

## 2015-08-12 MED ORDER — LINACLOTIDE 145 MCG PO CAPS: 145.0000 ug | ORAL_CAPSULE | Freq: Every day | ORAL | 2 refills | Status: DC

## 2015-08-12 MED FILL — LINZESS 145 MCG CAPSULE: 145 | 90 days supply | Qty: 90 | Fill #0

## 2015-08-12 NOTE — Addendum Note (Signed)
Addended by: Kelle Darting A on: 08/12/2015 10:20 AM   Modules accepted: Orders

## 2015-08-12 NOTE — Patient Instructions (Signed)
Please change linzess to 145 mg once daily to see if this helps. Pick up benadryl 25mg  and try 1/2 tab once daily in the evening as needed for sleep. If this does not help let me know and we can try Belsomra. Complete lab work prior to leaving.

## 2015-08-12 NOTE — Assessment & Plan Note (Signed)
On coreg per cards- HR today looks good.

## 2015-08-12 NOTE — Assessment & Plan Note (Signed)
Too many BM's on current dose of linzess. Will decrease to 145 mcg to see if this helps decrease frequency of BM's.

## 2015-08-12 NOTE — Assessment & Plan Note (Signed)
Trial of benadryl 25mg  1/2 tab HS prn, if still too drowsy in AM, pt to let me know and we will consider belsomra.

## 2015-08-12 NOTE — Telephone Encounter (Signed)
Attempted to reach pt and left voicemail to check mychart message.

## 2015-08-12 NOTE — Telephone Encounter (Signed)
TSH is low.  Decrease synthroid from 175 to 150.  Repeat TSH in 6 weeks.

## 2015-08-12 NOTE — Progress Notes (Signed)
Subjective:    Patient ID: Cheyenne Hawkins, female    DOB: [birth year], 40 y.o.   MRN: [id number]  HPI  Cheyenne Hawkins is a 40 yr old female who presents today for follow up.  1) Hypothyroid-  Lab Results  Component Value Date   TSH 0.50 05/13/2015   2) IBS-  Reports linzess helped her increase the frequency of her BM's.  Has been having 3 bm's a day when taking.   3) Tachycardia- reports neg stress test done by cards. (Dr. Doylene Canard) who placed pt on coreg. Started about 4 days ago.   4) insomnia- notes trouble "turning it off" at night.  Has tried aleve PM with success but wakes up drowsy.  Review of Systems See HPI    Past Medical History:  Diagnosis Date  . Anemia, iron deficiency 11/19/2011  . Depression 2012  . Fatty liver 03/21/2011  . GERD (gastroesophageal reflux disease)   . Grave's disease 2002   s/p thyroidectomy  . Heart murmur   . History of chicken pox   . HSV-2 (herpes simplex virus 2) infection      Social History   Social History  . Marital status: Married    Spouse name: N/A  . Number of children: 3  . Years of education: N/A   Occupational History  .  [employer]   Social History Main Topics  . Smoking status: Never Smoker  . Smokeless tobacco: Never Used  . Alcohol use 6.0 oz/week    10 Glasses of wine per week  . Drug use: No  . Sexual activity: Yes    Birth control/ protection: Surgical     Comment: btl   Other Topics Concern  . Not on file   Social History Narrative   Regular exercise:  No   Caffeine Use: 2 drinks weekly          Past Surgical History:  Procedure Laterality Date  . CESAREAN SECTION  2001  . CESAREAN SECTION  2003  . CESAREAN SECTION  2007  . THYROIDECTOMY  2003   for graves disease  . TUBAL LIGATION      Family History  Problem Relation Age of Onset  . Alcohol abuse Mother   . Depression Mother   . Bipolar disorder Mother   . Alcohol abuse Father   . Diabetes Father   . Hypertension Father   .  Cancer Maternal Grandmother     colon  . Depression Maternal Grandmother   . Diabetes Maternal Grandmother   . Hypertension Maternal Grandmother   . Cancer Paternal Grandmother     breast  . Diabetes Paternal Grandmother   . Hypertension Paternal Grandmother     No Known Allergies  Current Outpatient Prescriptions on File Prior to Visit  Medication Sig Dispense Refill  . escitalopram (LEXAPRO) 20 MG tablet TAKE 1 TABLET (20 MG TOTAL) BY MOUTH DAILY. 90 tablet 1  . levothyroxine (SYNTHROID, LEVOTHROID) 175 MCG tablet TAKE 1 TABLET (175 MCG) BY MOUTH DAILY BEFORE BREAKFAST. 90 tablet 1  . linaclotide (LINZESS) 290 MCG CAPS capsule Take 1 capsule (290 mcg total) by mouth daily before breakfast. 90 capsule 0  . Multiple Vitamins-Minerals (MULTIVITAMIN WITH MINERALS) tablet Take 1 tablet by mouth daily.    . pantoprazole (PROTONIX) 40 MG tablet TAKE 1 TABLET BY MOUTH DAILY. 90 tablet 0  . valACYclovir (VALTREX) 500 MG tablet Take 1 tablet (500 mg total) by mouth daily as needed. 30 tablet 0  . venlafaxine  XR (EFFEXOR XR) 37.5 MG 24 hr capsule Take  2 tabs by mouth once daily 180 capsule 1   No current facility-administered medications on file prior to visit.     BP 118/82   Pulse 84   Temp 98 F (36.7 C) (Oral)   Resp 18   Ht 5\' 5"  (1.651 m)   Wt 185 lb 6.4 oz (84.1 kg)   LMP 08/08/2015 (Approximate)   SpO2 98% Comment: room air  BMI 30.85 kg/m     Objective:   Physical Exam  Constitutional: She is oriented to person, place, and time. She appears well-developed and well-nourished.  HENT:  Head: Normocephalic and atraumatic.  Right Ear: Tympanic membrane and ear canal normal.  Left Ear: Tympanic membrane and ear canal normal.  Cardiovascular: Normal rate, regular rhythm and normal heart sounds.   No murmur heard. Pulmonary/Chest: Effort normal and breath sounds normal. No respiratory distress. She has no wheezes.  Musculoskeletal: She exhibits no edema.  Neurological: She  is alert and oriented to person, place, and time.  Psychiatric: She has a normal mood and affect. Her behavior is normal. Judgment and thought content normal.          Assessment & Plan:

## 2015-08-12 NOTE — Assessment & Plan Note (Signed)
Obtain follow up TSH.  

## 2015-08-15 MED FILL — LEVOTHYROXINE 150 MCG TAB: 150 | 30 days supply | Qty: 30 | Fill #0

## 2015-08-15 MED FILL — VALACYCLOVIR HCL 500 MG TAB: 500 | 30 days supply | Qty: 30 | Fill #0

## 2015-09-24 MED FILL — LEVOTHYROXINE 150 MCG TAB: 150 | 30 days supply | Qty: 30 | Fill #1

## 2015-09-24 MED FILL — CARVEDILOL 3.125 MG TABLET: 3.125 | 30 days supply | Qty: 60 | Fill #1

## 2015-09-24 MED FILL — ESCITALOPRAM 20 MG TABLET: 20 | 90 days supply | Qty: 90 | Fill #0

## 2015-10-14 MED FILL — VENLAFAXINE HCL ER 37.5 MG: 37.5 | 90 days supply | Qty: 180 | Fill #1

## 2015-11-12 ENCOUNTER — Ambulatory Visit: Payer: 59 | Admitting: Family

## 2015-11-17 MED FILL — LEVOTHYROXINE 150 MCG TAB: 150 | 30 days supply | Qty: 30 | Fill #2

## 2015-11-17 MED FILL — CARVEDILOL 3.125 MG TABLET: 3.125 | 30 days supply | Qty: 60 | Fill #0

## 2015-11-19 ENCOUNTER — Ambulatory Visit: Payer: 59 | Admitting: Family

## 2015-11-24 ENCOUNTER — Encounter: Payer: Self-pay | Admitting: Family

## 2015-11-24 ENCOUNTER — Ambulatory Visit (INDEPENDENT_AMBULATORY_CARE_PROVIDER_SITE_OTHER): Payer: 59 | Admitting: Family

## 2015-11-24 VITALS — BP 113/83 | HR 93 | Temp 98.6°F | Resp 18 | Ht 65.0 in | Wt 191.4 lb

## 2015-11-24 DIAGNOSIS — L309 Dermatitis, unspecified: Secondary | ICD-10-CM | POA: Diagnosis not present

## 2015-11-24 DIAGNOSIS — E039 Hypothyroidism, unspecified: Secondary | ICD-10-CM | POA: Diagnosis not present

## 2015-11-24 DIAGNOSIS — G4700 Insomnia, unspecified: Secondary | ICD-10-CM | POA: Diagnosis not present

## 2015-11-24 DIAGNOSIS — K589 Irritable bowel syndrome without diarrhea: Secondary | ICD-10-CM | POA: Diagnosis not present

## 2015-11-24 DIAGNOSIS — F418 Other specified anxiety disorders: Secondary | ICD-10-CM

## 2015-11-24 DIAGNOSIS — R000 Tachycardia, unspecified: Secondary | ICD-10-CM

## 2015-11-24 MED ORDER — CARVEDILOL 3.125 MG PO TABS: 3.1250 mg | ORAL_TABLET | Freq: Two times a day (BID) | ORAL | 1 refills | Status: DC

## 2015-11-24 MED ORDER — VALACYCLOVIR HCL 500 MG PO TABS: 500.0000 mg | ORAL_TABLET | Freq: Every day | ORAL | 0 refills | Status: DC | PRN

## 2015-11-24 MED ORDER — ESCITALOPRAM OXALATE 20 MG PO TABS: ORAL_TABLET | ORAL | 1 refills | Status: DC

## 2015-11-24 MED ORDER — RANITIDINE HCL 150 MG PO CAPS: 150.0000 mg | ORAL_CAPSULE | Freq: Two times a day (BID) | ORAL | 5 refills | Status: DC

## 2015-11-24 MED ORDER — LEVOTHYROXINE SODIUM 150 MCG PO TABS: 150.0000 ug | ORAL_TABLET | Freq: Every day | ORAL | 5 refills | Status: DC

## 2015-11-24 MED ORDER — LINACLOTIDE 145 MCG PO CAPS: 145.0000 ug | ORAL_CAPSULE | Freq: Every day | ORAL | 1 refills | Status: DC

## 2015-11-24 MED ORDER — VENLAFAXINE HCL ER 37.5 MG PO CP24: ORAL_CAPSULE | ORAL | 1 refills | Status: DC

## 2015-11-24 MED ORDER — BETAMETHASONE DIPROPIONATE 0.05 % EX CREA: TOPICAL_CREAM | Freq: Two times a day (BID) | CUTANEOUS | 1 refills | Status: DC

## 2015-11-24 MED FILL — ESCITALOPRAM 20 MG TABLET: 20 | 90 days supply | Qty: 90 | Fill #0

## 2015-11-24 NOTE — Patient Instructions (Signed)
Please complete lab work prior to leaving. You can try adding melatonin 1 hour before bedtime.

## 2015-11-24 NOTE — Progress Notes (Signed)
Subjective:    Patient ID: Cheyenne Hawkins, female    DOB: [birth year], 40 y.o.   MRN: [id number]  HPI  Cheyenne Hawkins is a 40 yr old female who presents today for follow up.  1) IBS- currently maintained on linzess. Using 190 mcg linzess QOD which is not working as well as 145 mcg daily worked.  2) Hypothyroid- currently maintained on synthroid. Synthroid was decreased from 154mcg to 161mcg last visit.  Reports that    Lab Results  Component Value Date   TSH 0.16 (L) 08/12/2015   3) Insomnia- using Zquil with some relief.  4) Ezcema- notes increased stress and eczema has flared up. Has eczema on her fingers.   5) Tachycardia- maintained on coreg.    BP Readings from Last 3 Encounters:  11/24/15 113/83  08/12/15 118/82  05/13/15 114/74   6) Depression/anxiety- maintained on effexor and lexapro.  Reports mood is stable. Anxiety stable.  Review of Systems See HPI  Past Medical History:  Diagnosis Date  . Anemia, iron deficiency 11/19/2011  . Depression 2012  . Fatty liver 03/21/2011  . GERD (gastroesophageal reflux disease)   . Grave's disease 2002   s/p thyroidectomy  . Heart murmur   . History of chicken pox   . HSV-2 (herpes simplex virus 2) infection      Social History   Social History  . Marital status: Married    Spouse name: N/A  . Number of children: 3  . Years of education: N/A   Occupational History  .  [employer]   Social History Main Topics  . Smoking status: Never Smoker  . Smokeless tobacco: Never Used  . Alcohol use 6.0 oz/week    10 Glasses of wine per week  . Drug use: No  . Sexual activity: Yes    Birth control/ protection: Surgical     Comment: btl   Other Topics Concern  . Not on file   Social History Narrative   Regular exercise:  No   Caffeine Use: 2 drinks weekly          Past Surgical History:  Procedure Laterality Date  . CESAREAN SECTION  2001  . CESAREAN SECTION  2003  . CESAREAN SECTION  2007  . THYROIDECTOMY   2003   for graves disease  . TUBAL LIGATION      Family History  Problem Relation Age of Onset  . Alcohol abuse Mother   . Depression Mother   . Bipolar disorder Mother   . Alcohol abuse Father   . Diabetes Father   . Hypertension Father   . Cancer Maternal Grandmother     colon  . Depression Maternal Grandmother   . Diabetes Maternal Grandmother   . Hypertension Maternal Grandmother   . Cancer Paternal Grandmother     breast  . Diabetes Paternal Grandmother   . Hypertension Paternal Grandmother     No Known Allergies  Current Outpatient Prescriptions on File Prior to Visit  Medication Sig Dispense Refill  . carvedilol (COREG) 3.125 MG tablet Take 1 tablet by mouth daily.  1  . escitalopram (LEXAPRO) 20 MG tablet TAKE 1 TABLET (20 MG TOTAL) BY MOUTH DAILY. 90 tablet 1  . levothyroxine (SYNTHROID, LEVOTHROID) 150 MCG tablet Take 1 tablet (150 mcg total) by mouth daily. 30 tablet 2  . linaclotide (LINZESS) 145 MCG CAPS capsule Take 1 capsule (145 mcg total) by mouth daily before breakfast. 30 capsule 2  . Multiple Vitamins-Minerals (MULTIVITAMIN WITH  MINERALS) tablet Take 1 tablet by mouth daily.    . pantoprazole (PROTONIX) 40 MG tablet TAKE 1 TABLET BY MOUTH DAILY. 90 tablet 0  . venlafaxine XR (EFFEXOR XR) 37.5 MG 24 hr capsule Take  2 tabs by mouth once daily 180 capsule 1   No current facility-administered medications on file prior to visit.     BP 113/83 (BP Location: Right Arm, Cuff Size: Large)   Pulse 93   Temp 98.6 F (37 C) (Oral)   Resp 18   Ht 5\' 5"  (1.651 m)   Wt 191 lb 6.4 oz (86.8 kg)   LMP 11/03/2015   SpO2 99% Comment: room air  BMI 31.85 kg/m       Objective:   Physical Exam  Constitutional: She appears well-developed and well-nourished.  Cardiovascular: Normal rate, regular rhythm and normal heart sounds.   No murmur heard. Pulmonary/Chest: Effort normal and breath sounds normal. No respiratory distress. She has no wheezes.  Skin:  Some  mild eczema noted left wring finger  Psychiatric: She has a normal mood and affect. Her behavior is normal. Judgment and thought content normal.          Assessment & Plan:  Eczema- rx steroid cream.

## 2015-11-24 NOTE — Progress Notes (Signed)
Pre visit review using our clinic review tool, if applicable. No additional management support is needed unless otherwise documented below in the visit note. 

## 2015-11-25 ENCOUNTER — Telehealth: Payer: Self-pay | Admitting: Family

## 2015-11-25 DIAGNOSIS — E039 Hypothyroidism, unspecified: Secondary | ICD-10-CM

## 2015-11-25 LAB — TSH: TSH: 13.21 u[IU]/mL — ABNORMAL HIGH (ref 0.35–4.50)

## 2015-11-25 MED ORDER — LEVOTHYROXINE SODIUM 25 MCG PO CAPS: ORAL_CAPSULE | ORAL | 2 refills | Status: DC

## 2015-11-25 NOTE — Telephone Encounter (Signed)
Please let pt know that now the thyroid medication appears to be too low of a dose.  I would like her to continue synthroid 11mcg and add 47mcg (1/2 tablet=12.26mcg) once daily. Repeat TSH in 6 weeks.

## 2015-11-25 NOTE — Telephone Encounter (Signed)
Attempted to call Pt today at 1:55pm. LVM advising Pt to check her MyChart message regarding her lab results.

## 2015-11-26 NOTE — Assessment & Plan Note (Signed)
Rate stable on coreg. She was taking 2 tabs together once daily. I have asked her to split up into 1 tab twice daily.

## 2015-11-26 NOTE — Assessment & Plan Note (Addendum)
Fair control- we discussed trial of melatonin.

## 2015-11-26 NOTE — Assessment & Plan Note (Signed)
Uncontrolled. Resume the 120mcg dose of linzess since she had better response to this.

## 2015-11-26 NOTE — Assessment & Plan Note (Signed)
Lab Results  Component Value Date   TSH 13.21 (H) 11/24/2015   See phone note- will continue synthroid 126mcg and add 12.70mcg once daily to this.

## 2015-11-26 NOTE — Assessment & Plan Note (Signed)
Stable on effexor and lexapro.

## 2015-12-18 DIAGNOSIS — M79671 Pain in right foot: Secondary | ICD-10-CM | POA: Diagnosis not present

## 2015-12-18 DIAGNOSIS — M7661 Achilles tendinitis, right leg: Secondary | ICD-10-CM | POA: Diagnosis not present

## 2015-12-30 DIAGNOSIS — R002 Palpitations: Secondary | ICD-10-CM | POA: Diagnosis not present

## 2015-12-30 DIAGNOSIS — M25571 Pain in right ankle and joints of right foot: Secondary | ICD-10-CM | POA: Diagnosis not present

## 2015-12-30 DIAGNOSIS — E663 Overweight: Secondary | ICD-10-CM | POA: Diagnosis not present

## 2015-12-30 DIAGNOSIS — E038 Other specified hypothyroidism: Secondary | ICD-10-CM | POA: Diagnosis not present

## 2016-01-23 ENCOUNTER — Encounter: Payer: Self-pay | Admitting: Family

## 2016-01-23 ENCOUNTER — Ambulatory Visit: Payer: 59 | Admitting: Family

## 2016-01-23 ENCOUNTER — Other Ambulatory Visit (HOSPITAL_COMMUNITY)
Admission: RE | Admit: 2016-01-23 | Discharge: 2016-01-23 | Disposition: A | Discharge status: Home / Self Care | Payer: 59 | Source: Ambulatory Visit | Attending: Family | Admitting: Family

## 2016-01-23 ENCOUNTER — Ambulatory Visit (INDEPENDENT_AMBULATORY_CARE_PROVIDER_SITE_OTHER): Payer: 59 | Admitting: Family

## 2016-01-23 VITALS — BP 107/61 | HR 86 | Temp 98.2°F | Resp 16 | Ht 65.0 in | Wt 190.6 lb

## 2016-01-23 DIAGNOSIS — B373 Candidiasis of vulva and vagina: Secondary | ICD-10-CM | POA: Insufficient documentation

## 2016-01-23 DIAGNOSIS — N760 Acute vaginitis: Secondary | ICD-10-CM | POA: Diagnosis not present

## 2016-01-23 DIAGNOSIS — E039 Hypothyroidism, unspecified: Secondary | ICD-10-CM | POA: Diagnosis not present

## 2016-01-23 DIAGNOSIS — M79671 Pain in right foot: Secondary | ICD-10-CM

## 2016-01-23 LAB — TSH: TSH: 0.38 u[IU]/mL (ref 0.35–4.50)

## 2016-01-23 MED ORDER — FLUCONAZOLE 150 MG PO TABS: 150.0000 mg | ORAL_TABLET | Freq: Once | ORAL | 0 refills | Status: AC

## 2016-01-23 MED FILL — FLUCONAZOLE 150 MG TABLET: 150 | 2 days supply | Qty: 2 | Fill #0

## 2016-01-23 MED FILL — CARVEDILOL 3.125 MG TABLET: 3.125 | 90 days supply | Qty: 180 | Fill #0

## 2016-01-23 MED FILL — BETAMETHASONE DP 0.05% CRM: 0.05 | 7 days supply | Qty: 15 | Fill #0

## 2016-01-23 MED FILL — raNITIdine HCL 150 MG TABS: 150 | 30 days supply | Qty: 60 | Fill #0

## 2016-01-23 NOTE — Progress Notes (Signed)
Pre visit review using our clinic review tool, if applicable. No additional management support is needed unless otherwise documented below in the visit note. 

## 2016-01-23 NOTE — Progress Notes (Signed)
Subjective:    Patient ID: Cheyenne Hawkins, female    DOB: [birth year], 41 y.o.   MRN: [id number]  HPI  Cheyenne Hawkins is a 41 year old female who presents today with several concerns.  1) Hypothyroid- patient has been taking 144mcg of synthroid + 31mcg once daily. Lab Results  Component Value Date   TSH 13.21 (H) 11/24/2015   2) Right foot pain- was told that she has some calcification and "stressed" achilles. She is icing, has a wedge for her shoe.    3) vaginal itching- reports that she has some vaginal dryness.  Has a mild discharge. + itching. No odor.    Review of Systems  Past Medical History:  Diagnosis Date  . Anemia, iron deficiency 11/19/2011  . Depression 2012  . Fatty liver 03/21/2011  . GERD (gastroesophageal reflux disease)   . Grave's disease 2002   s/p thyroidectomy  . Heart murmur   . History of chicken pox   . HSV-2 (herpes simplex virus 2) infection      Social History   Social History  . Marital status: Married    Spouse name: N/A  . Number of children: 3  . Years of education: N/A   Occupational History  .  [place]   Social History Main Topics  . Smoking status: Never Smoker  . Smokeless tobacco: Never Used  . Alcohol use 6.0 oz/week    10 Glasses of wine per week  . Drug use: No  . Sexual activity: Yes    Birth control/ protection: Surgical     Comment: btl   Other Topics Concern  . Not on file   Social History Narrative   Regular exercise:  No   Caffeine Use: 2 drinks weekly          Past Surgical History:  Procedure Laterality Date  . CESAREAN SECTION  2001  . CESAREAN SECTION  2003  . CESAREAN SECTION  2007  . THYROIDECTOMY  2003   for graves disease  . TUBAL LIGATION      Family History  Problem Relation Age of Onset  . Alcohol abuse Mother   . Depression Mother   . Bipolar disorder Mother   . Alcohol abuse Father   . Diabetes Father   . Hypertension Father   . Cancer Maternal Grandmother     colon  .  Depression Maternal Grandmother   . Diabetes Maternal Grandmother   . Hypertension Maternal Grandmother   . Cancer Paternal Grandmother     breast  . Diabetes Paternal Grandmother   . Hypertension Paternal Grandmother     No Known Allergies  Current Outpatient Prescriptions on File Prior to Visit  Medication Sig Dispense Refill  . betamethasone dipropionate (DIPROLENE) 0.05 % cream Apply topically 2 (two) times daily. 30 g 1  . carvedilol (COREG) 3.125 MG tablet Take 1 tablet (3.125 mg total) by mouth 2 (two) times daily with a meal.  1  . escitalopram (LEXAPRO) 20 MG tablet TAKE 1 TABLET (20 MG TOTAL) BY MOUTH DAILY. 90 tablet 1  . levothyroxine (SYNTHROID, LEVOTHROID) 150 MCG tablet Take 1 tablet (150 mcg total) by mouth daily. 30 tablet 5  . Levothyroxine Sodium 25 MCG CAPS 1/2 tablet by mouth once daily. 45 capsule 2  . linaclotide (LINZESS) 145 MCG CAPS capsule Take 1 capsule (145 mcg total) by mouth daily before breakfast. 90 capsule 1  . Multiple Vitamins-Minerals (MULTIVITAMIN WITH MINERALS) tablet Take 1 tablet by mouth daily.    Marland Kitchen  ranitidine (ZANTAC) 150 MG capsule Take 1 capsule (150 mg total) by mouth 2 (two) times daily. 60 capsule 5  . valACYclovir (VALTREX) 500 MG tablet Take 1 tablet (500 mg total) by mouth daily as needed. 30 tablet 0  . venlafaxine XR (EFFEXOR XR) 37.5 MG 24 hr capsule Take  2 tabs by mouth once daily 180 capsule 1  . pantoprazole (PROTONIX) 40 MG tablet TAKE 1 TABLET BY MOUTH DAILY. (Patient not taking: Reported on 01/23/2016) 90 tablet 0   No current facility-administered medications on file prior to visit.     BP 107/61 (BP Location: Right Arm, Cuff Size: Large)   Pulse 86   Temp 98.2 F (36.8 C) (Oral)   Resp 16   Ht 5\' 5"  (1.651 m)   Wt 190 lb 9.6 oz (86.5 kg)   LMP 01/02/2016   SpO2 100% Comment: room air  BMI 31.72 kg/m        Objective:   Physical Exam  Constitutional: She appears well-developed and well-nourished.    Cardiovascular: Normal rate, regular rhythm and normal heart sounds.   No murmur heard. Pulmonary/Chest: Effort normal and breath sounds normal. No respiratory distress. She has no wheezes.  Genitourinary:  Genitourinary Comments: Some thick white vaginal discharge is noted.   Psychiatric: She has a normal mood and affect. Her behavior is normal. Judgment and thought content normal.          Assessment & Plan:  Vaginitis- clinically looks like yeast. Will begin rx with diflucan. Wet prep/GC and Chlamydia swabs completed and will be sent to the lab for further evaluation.  Right foot pain- advised pt to follow up with orthopedics.  Hypothryroid- obtain follow up tsh. Continue synthroid.

## 2016-01-23 NOTE — Addendum Note (Signed)
Addended by: Kelle Darting A on: 01/23/2016 09:59 AM   Modules accepted: Orders

## 2016-01-23 NOTE — Patient Instructions (Signed)
Please complete lab work prior to leaving. Follow up with orthopedics. Begin diflucan for probable yeast infection.

## 2016-01-26 ENCOUNTER — Telehealth: Payer: Self-pay | Admitting: *Deleted

## 2016-01-26 ENCOUNTER — Telehealth: Payer: Self-pay | Admitting: Family

## 2016-01-26 DIAGNOSIS — E039 Hypothyroidism, unspecified: Secondary | ICD-10-CM

## 2016-01-26 LAB — CERVICOVAGINAL ANCILLARY ONLY
Chlamydia: NEGATIVE
Neisseria Gonorrhea: NEGATIVE
WET PREP (BD AFFIRM): POSITIVE — AB

## 2016-01-26 MED ORDER — METRONIDAZOLE 0.75 % VA GEL: 1.0000 | Freq: Every day | VAGINAL | 0 refills | Status: AC

## 2016-01-26 MED ORDER — FLUCONAZOLE 150 MG PO TABS: 150.0000 mg | ORAL_TABLET | Freq: Once | ORAL | 0 refills | Status: AC

## 2016-01-26 NOTE — Telephone Encounter (Signed)
Left message to check mychart acct. Message sent and future order entered.

## 2016-01-26 NOTE — Telephone Encounter (Signed)
See mychart.  

## 2016-01-26 NOTE — Telephone Encounter (Signed)
-----   Message from Debbrah Alar, NP sent at 01/23/2016  4:44 PM EST ----- Thyroid slightly overtreated. Please cut synthroid 62mcg in half and take 1/2 tab of the 25 in addition to her full 150 mcg tablet. Repeat tsh in 6 weeks. Dx hypothyroid.

## 2016-01-29 ENCOUNTER — Telehealth: Payer: Self-pay | Admitting: Family

## 2016-01-29 NOTE — Telephone Encounter (Signed)
Notified patient of results and rx's.

## 2016-02-13 MED FILL — LEVOTHYROXINE 150 MCG TAB: 150 | 90 days supply | Qty: 90 | Fill #0

## 2016-02-13 MED FILL — LEVOTHYROXINE 25 MCG TABLET: 25 | 90 days supply | Qty: 45 | Fill #0

## 2016-03-08 ENCOUNTER — Other Ambulatory Visit: Payer: 59

## 2016-03-08 DIAGNOSIS — Z0289 Encounter for other administrative examinations: Secondary | ICD-10-CM

## 2016-04-15 DIAGNOSIS — E034 Atrophy of thyroid (acquired): Secondary | ICD-10-CM | POA: Diagnosis not present

## 2016-04-15 DIAGNOSIS — R002 Palpitations: Secondary | ICD-10-CM | POA: Diagnosis not present

## 2016-04-15 DIAGNOSIS — R42 Dizziness and giddiness: Secondary | ICD-10-CM | POA: Diagnosis not present

## 2016-04-15 DIAGNOSIS — I341 Nonrheumatic mitral (valve) prolapse: Secondary | ICD-10-CM | POA: Diagnosis not present

## 2016-04-15 MED FILL — MECLIZINE 25 MG TABLET: 25 | 15 days supply | Qty: 30 | Fill #0

## 2016-04-15 MED FILL — NEO/POLYMYXIN/HC EAR SOLN: 3.5-10000-1 | 30 days supply | Qty: 10 | Fill #0

## 2016-05-19 ENCOUNTER — Other Ambulatory Visit: Payer: 59

## 2016-06-04 DIAGNOSIS — E039 Hypothyroidism, unspecified: Secondary | ICD-10-CM | POA: Diagnosis not present

## 2016-06-16 MED FILL — VENLAFAXINE HCL ER 37.5 MG: 37.5 | 90 days supply | Qty: 180 | Fill #0

## 2016-06-16 MED FILL — LEVOTHYROXINE 150 MCG TAB: 150 | 90 days supply | Qty: 90 | Fill #1

## 2016-07-08 ENCOUNTER — Ambulatory Visit (INDEPENDENT_AMBULATORY_CARE_PROVIDER_SITE_OTHER): Payer: 59 | Admitting: Family

## 2016-07-08 ENCOUNTER — Encounter: Payer: Self-pay | Admitting: Family

## 2016-07-08 VITALS — BP 111/76 | HR 95 | Temp 98.5°F | Resp 16 | Ht 65.0 in | Wt 190.0 lb

## 2016-07-08 DIAGNOSIS — K589 Irritable bowel syndrome without diarrhea: Secondary | ICD-10-CM | POA: Diagnosis not present

## 2016-07-08 DIAGNOSIS — F418 Other specified anxiety disorders: Secondary | ICD-10-CM | POA: Diagnosis not present

## 2016-07-08 DIAGNOSIS — E039 Hypothyroidism, unspecified: Secondary | ICD-10-CM | POA: Diagnosis not present

## 2016-07-08 DIAGNOSIS — I10 Essential (primary) hypertension: Secondary | ICD-10-CM

## 2016-07-08 MED ORDER — VALACYCLOVIR HCL 500 MG PO TABS: 500.0000 mg | ORAL_TABLET | Freq: Every day | ORAL | 2 refills | Status: DC | PRN

## 2016-07-08 MED FILL — VALACYCLOVIR HCL 500 MG TAB: 500 | 30 days supply | Qty: 30 | Fill #0

## 2016-07-08 NOTE — Patient Instructions (Signed)
Please send [place] a copy of your lab work from Dr. Doylene Canard.

## 2016-07-08 NOTE — Progress Notes (Signed)
Subjective:    Patient ID: Cheyenne Hawkins, female    DOB: January 17, 1975, 41 y.o.   MRN: [id number]  HPI  Cheyenne Hawkins presents today for follow up.  1) hypothyroid- Reports that she is feeling well except for some bouts of anxiety.   Lab Results  Component Value Date   TSH 0.38 01/23/2016   2) HTN-  BP Readings from Last 3 Encounters:  07/08/16 111/76  01/23/16 107/61  11/24/15 113/83   3) Depression/anxiety- Reports that she tried to stop effexor and felt tearful.  She restarted and now feels better. She continues lexapro.   4) IBS- has chronic constipation. Linzess- reports that it is too expensive so she is not taking. It did help her when she was taking.   Review of Systems See HPI  Past Medical History:  Diagnosis Date  . Anemia, iron deficiency 11/19/2011  . Depression 2012  . Fatty liver 03/21/2011  . GERD (gastroesophageal reflux disease)   . Grave's disease 2002   s/p thyroidectomy  . Heart murmur   . History of chicken pox   . HSV-2 (herpes simplex virus 2) infection      Social History   Social History  . Marital status: Married    Spouse name: N/A  . Number of children: 3  . Years of education: N/A   Occupational History  .  [employer]   Social History Main Topics  . Smoking status: Never Smoker  . Smokeless tobacco: Never Used  . Alcohol use 6.0 oz/week    10 Glasses of wine per week  . Drug use: No  . Sexual activity: Yes    Birth control/ protection: Surgical     Comment: btl   Other Topics Concern  . Not on file   Social History Narrative   Regular exercise:  No   Caffeine Use: 2 drinks weekly          Past Surgical History:  Procedure Laterality Date  . CESAREAN SECTION  2001  . CESAREAN SECTION  2003  . CESAREAN SECTION  2007  . THYROIDECTOMY  2003   for graves disease  . TUBAL LIGATION      Family History  Problem Relation Age of Onset  . Alcohol abuse Mother   . Depression Mother   . Bipolar disorder Mother   .  Alcohol abuse Father   . Diabetes Father   . Hypertension Father   . Cancer Maternal Grandmother        colon  . Depression Maternal Grandmother   . Diabetes Maternal Grandmother   . Hypertension Maternal Grandmother   . Cancer Paternal Grandmother        breast  . Diabetes Paternal Grandmother   . Hypertension Paternal Grandmother     No Known Allergies  Current Outpatient Prescriptions on File Prior to Visit  Medication Sig Dispense Refill  . betamethasone dipropionate (DIPROLENE) 0.05 % cream Apply topically 2 (two) times daily. 30 g 1  . carvedilol (COREG) 3.125 MG tablet Take 1 tablet (3.125 mg total) by mouth 2 (two) times daily with a meal.  1  . escitalopram (LEXAPRO) 20 MG tablet TAKE 1 TABLET (20 MG TOTAL) BY MOUTH DAILY. 90 tablet 1  . levothyroxine (SYNTHROID, LEVOTHROID) 150 MCG tablet Take 1 tablet (150 mcg total) by mouth daily. 30 tablet 5  . Levothyroxine Sodium 25 MCG CAPS 1/2 tablet by mouth once daily. 45 capsule 2  . linaclotide (LINZESS) 145 MCG CAPS capsule Take 1  capsule (145 mcg total) by mouth daily before breakfast. 90 capsule 1  . Multiple Vitamins-Minerals (MULTIVITAMIN WITH MINERALS) tablet Take 1 tablet by mouth daily.    . ranitidine (ZANTAC) 150 MG capsule Take 1 capsule (150 mg total) by mouth 2 (two) times daily. 60 capsule 5  . venlafaxine XR (EFFEXOR XR) 37.5 MG 24 hr capsule Take  2 tabs by mouth once daily 180 capsule 1   No current facility-administered medications on file prior to visit.     BP 111/76 (BP Location: Right Arm, Cuff Size: Large)   Pulse 95   Temp 98.5 F (36.9 C) (Oral)   Resp 16   Ht 5\' 5"  (1.651 m)   Wt 190 lb (86.2 kg)   LMP 06/17/2016   SpO2 96%   BMI 31.62 kg/m       Objective:   Physical Exam  Constitutional: She is oriented to person, place, and time. She appears well-developed and well-nourished.  HENT:  Head: Normocephalic and atraumatic.  Cardiovascular: Normal rate, regular rhythm and normal heart  sounds.   No murmur heard. Pulmonary/Chest: Effort normal and breath sounds normal. No respiratory distress. She has no wheezes.  Musculoskeletal: She exhibits no edema.  Neurological: She is alert and oriented to person, place, and time.  Psychiatric: She has a normal mood and affect. Her behavior is normal. Judgment and thought content normal.          Assessment & Plan:  Depression/Anxiety- mild anxiety symptoms. I want to make sure that her Thyroid is not over-treated and contributing. Continue current meds.  HTN- BP is stable.  IBS- recommended that she try to print a card from [employer] to see if it will lower her copay.  Hypothyroid- I asked her to send me a copy of her TSH results from Dr. Doylene Canard.

## 2016-07-22 ENCOUNTER — Encounter: Payer: Self-pay | Admitting: Family

## 2016-07-23 ENCOUNTER — Ambulatory Visit: Payer: 59 | Admitting: Family

## 2016-07-23 ENCOUNTER — Encounter: Payer: Self-pay | Admitting: Family

## 2016-09-07 ENCOUNTER — Other Ambulatory Visit: Payer: Self-pay | Admitting: Family

## 2016-09-07 DIAGNOSIS — Z1231 Encounter for screening mammogram for malignant neoplasm of breast: Secondary | ICD-10-CM

## 2016-09-20 ENCOUNTER — Ambulatory Visit
Admission: RE | Admit: 2016-09-20 | Discharge: 2016-09-20 | Disposition: A | Discharge status: Home / Self Care | Payer: 59 | Source: Ambulatory Visit | Attending: Family | Admitting: Family

## 2016-09-20 DIAGNOSIS — Z1231 Encounter for screening mammogram for malignant neoplasm of breast: Secondary | ICD-10-CM

## 2016-10-04 ENCOUNTER — Other Ambulatory Visit: Payer: Self-pay | Admitting: Family

## 2016-10-04 MED FILL — LEVOTHYROXINE 150 MCG TAB: 150 | 30 days supply | Qty: 30 | Fill #0

## 2016-10-08 ENCOUNTER — Ambulatory Visit (INDEPENDENT_AMBULATORY_CARE_PROVIDER_SITE_OTHER): Payer: 59 | Admitting: Family

## 2016-10-08 ENCOUNTER — Encounter: Payer: Self-pay | Admitting: Family

## 2016-10-08 VITALS — BP 125/80 | HR 83 | Temp 98.0°F | Resp 18 | Ht 65.0 in | Wt 185.4 lb

## 2016-10-08 DIAGNOSIS — E039 Hypothyroidism, unspecified: Secondary | ICD-10-CM | POA: Diagnosis not present

## 2016-10-08 DIAGNOSIS — F418 Other specified anxiety disorders: Secondary | ICD-10-CM

## 2016-10-08 DIAGNOSIS — K589 Irritable bowel syndrome without diarrhea: Secondary | ICD-10-CM | POA: Diagnosis not present

## 2016-10-08 DIAGNOSIS — I10 Essential (primary) hypertension: Secondary | ICD-10-CM

## 2016-10-08 LAB — BASIC METABOLIC PANEL
BUN: 10 mg/dL (ref 6–23)
CHLORIDE: 101 meq/L (ref 96–112)
CO2: 28 mEq/L (ref 19–32)
Calcium: 9.1 mg/dL (ref 8.4–10.5)
Creatinine, Ser: 0.53 mg/dL (ref 0.40–1.20)
GFR: 162.15 mL/min (ref >60.00)
Glucose, Bld: 93 mg/dL (ref 70–99)
Potassium: 4 mEq/L (ref 3.5–5.1)
Sodium: 136 mEq/L (ref 135–145)

## 2016-10-08 LAB — TSH: TSH: 0.6 u[IU]/mL (ref 0.35–4.50)

## 2016-10-08 MED ORDER — ESCITALOPRAM OXALATE 20 MG PO TABS: ORAL_TABLET | ORAL | 1 refills | Status: DC

## 2016-10-08 MED ORDER — VENLAFAXINE HCL ER 150 MG PO CP24: 150.0000 mg | ORAL_CAPSULE | Freq: Every day | ORAL | 1 refills | Status: DC

## 2016-10-08 MED FILL — VENLAFAXINE HCL ER 150 MG C: 150 | 30 days supply | Qty: 30 | Fill #0

## 2016-10-08 MED FILL — ESCITALOPRAM 20 MG TABLET: 20 | 90 days supply | Qty: 90 | Fill #0

## 2016-10-08 NOTE — Assessment & Plan Note (Signed)
Uncontrolled. Advised counseling and increase in effexor.

## 2016-10-08 NOTE — Assessment & Plan Note (Signed)
Obtain follow up TSH.  

## 2016-10-08 NOTE — Assessment & Plan Note (Signed)
Stable off of linzess. Continue same.

## 2016-10-08 NOTE — Progress Notes (Signed)
Subjective:    Patient ID: Cheyenne Hawkins, female    DOB: Nov 09, 1975, 41 y.o.   MRN: [id number]  HPI   Cheyenne Hawkins is a 41 yr old female who presents today for follow up.  1) HTN-  BP Readings from Last 3 Encounters:  10/08/16 125/80  07/08/16 111/76  01/23/16 107/61   2) Hypothyroid- got confused and has only been taking 150 mcg/day.  Lab Results  Component Value Date   TSH 0.38 01/23/2016   3) Depression/anxiety-maintained on lexapro and effexor. Feels overwhelmed.  Using tylenol pm to help her sleep.    4) IBS- linzess was too expensive. Stopped.     Review of Systems See HPI  Past Medical History:  Diagnosis Date  . Anemia, iron deficiency 11/19/2011  . Depression 2012  . Fatty liver 03/21/2011  . GERD (gastroesophageal reflux disease)   . Grave's disease 2002   s/p thyroidectomy  . Heart murmur   . History of chicken pox   . HSV-2 (herpes simplex virus 2) infection      Social History   Social History  . Marital status: Married    Spouse name: N/A  . Number of children: 3  . Years of education: N/A   Occupational History  .  [employer]   Social History Main Topics  . Smoking status: Never Smoker  . Smokeless tobacco: Never Used  . Alcohol use 6.0 oz/week    10 Glasses of wine per week  . Drug use: No  . Sexual activity: Yes    Birth control/ protection: Surgical     Comment: btl   Other Topics Concern  . Not on file   Social History Narrative   Regular exercise:  No   Caffeine Use: 2 drinks weekly          Past Surgical History:  Procedure Laterality Date  . CESAREAN SECTION  2001  . CESAREAN SECTION  2003  . CESAREAN SECTION  2007  . THYROIDECTOMY  2003   for graves disease  . TUBAL LIGATION      Family History  Problem Relation Age of Onset  . Alcohol abuse Mother   . Depression Mother   . Bipolar disorder Mother   . Alcohol abuse Father   . Diabetes Father   . Hypertension Father   . Cancer Maternal Grandmother       colon  . Depression Maternal Grandmother   . Diabetes Maternal Grandmother   . Hypertension Maternal Grandmother   . Cancer Paternal Grandmother        breast  . Diabetes Paternal Grandmother   . Hypertension Paternal Grandmother     No Known Allergies  Current Outpatient Prescriptions on File Prior to Visit  Medication Sig Dispense Refill  . betamethasone dipropionate (DIPROLENE) 0.05 % cream Apply topically 2 (two) times daily. 30 g 1  . carvedilol (COREG) 3.125 MG tablet Take 1 tablet (3.125 mg total) by mouth 2 (two) times daily with a meal.  1  . escitalopram (LEXAPRO) 20 MG tablet TAKE 1 TABLET (20 MG TOTAL) BY MOUTH DAILY. 90 tablet 1  . levothyroxine (SYNTHROID, LEVOTHROID) 150 MCG tablet TAKE 1 TABLET (150 MCG TOTAL) BY MOUTH DAILY. 30 tablet 0  . linaclotide (LINZESS) 145 MCG CAPS capsule Take 1 capsule (145 mcg total) by mouth daily before breakfast. 90 capsule 1  . Multiple Vitamins-Minerals (MULTIVITAMIN WITH MINERALS) tablet Take 1 tablet by mouth daily.    . ranitidine (ZANTAC) 150 MG capsule  Take 1 capsule (150 mg total) by mouth 2 (two) times daily. 60 capsule 5  . valACYclovir (VALTREX) 500 MG tablet Take 1 tablet (500 mg total) by mouth daily as needed. 30 tablet 2  . venlafaxine XR (EFFEXOR XR) 37.5 MG 24 hr capsule Take  2 tabs by mouth once daily 180 capsule 1   No current facility-administered medications on file prior to visit.     BP 125/80 (BP Location: Right Arm, Cuff Size: Normal)   Pulse 83   Temp 98 F (36.7 C) (Oral)   Resp 18   Ht 5\' 5"  (1.651 m)   Wt 185 lb 6.4 oz (84.1 kg)   LMP 10/03/2016   SpO2 100%   BMI 30.85 kg/m       Objective:   Physical Exam  Constitutional: She appears well-developed and well-nourished.  Cardiovascular: Normal rate, regular rhythm and normal heart sounds.   No murmur heard. Pulmonary/Chest: Effort normal and breath sounds normal. No respiratory distress. She has no wheezes.  Psychiatric: Her behavior is  normal. Judgment and thought content normal.  tearful          Assessment & Plan:

## 2016-11-05 ENCOUNTER — Ambulatory Visit: Payer: 59 | Admitting: Family

## 2016-11-12 ENCOUNTER — Ambulatory Visit (INDEPENDENT_AMBULATORY_CARE_PROVIDER_SITE_OTHER): Payer: 59 | Admitting: Family

## 2016-11-12 ENCOUNTER — Encounter: Payer: Self-pay | Admitting: Family

## 2016-11-12 ENCOUNTER — Ambulatory Visit: Payer: 59 | Admitting: Family

## 2016-11-12 VITALS — BP 118/87 | HR 82 | Temp 98.2°F | Resp 16 | Ht 65.0 in | Wt 184.0 lb

## 2016-11-12 DIAGNOSIS — W57XXXA Bitten or stung by nonvenomous insect and other nonvenomous arthropods, initial encounter: Secondary | ICD-10-CM

## 2016-11-12 DIAGNOSIS — F418 Other specified anxiety disorders: Secondary | ICD-10-CM

## 2016-11-12 DIAGNOSIS — Z0289 Encounter for other administrative examinations: Secondary | ICD-10-CM

## 2016-11-12 MED ORDER — LEVOTHYROXINE SODIUM 150 MCG PO TABS: 150.0000 ug | ORAL_TABLET | Freq: Every day | ORAL | 1 refills | Status: DC

## 2016-11-12 MED ORDER — VENLAFAXINE HCL ER 150 MG PO CP24
150.0000 mg | ORAL_CAPSULE | Freq: Every day | ORAL | 1 refills | Status: DC
Start: 2016-11-12 — End: 2017-02-15

## 2016-11-12 MED FILL — LEVOTHYROXINE 150 MCG TAB: 150 | 90 days supply | Qty: 90 | Fill #0

## 2016-11-12 MED FILL — VENLAFAXINE HCL ER 150 MG C: 150 | 90 days supply | Qty: 90 | Fill #0

## 2016-11-12 NOTE — Assessment & Plan Note (Signed)
PHQ-9 was 18 now down to 5. Clinically improved. Continue current dose of effexor.  Encouraged pt to establish with EAP counselor.

## 2016-11-12 NOTE — Progress Notes (Signed)
Subjective:    Patient ID: Cheyenne Hawkins, female    DOB: [birth year], 41 y.o.   MRN: [id number]  HPI  [name] is a 41 yr old female who presents today for follow up. Last visit she described feeling overwhelmed. We increased her effexor last visit and suggested that she establish with a counselor.   Reports that she is feeling better overall. She is less tearful. She is more motivated to do things. Reports that she decorated her car for "trunk or treat," which she was happy about.   She does report that she got some bug bites (unsure when) and that the surrounding area has been itchy/swollen.     Review of Systems    see HPI  Past Medical History:  Diagnosis Date  . Anemia, iron deficiency 11/19/2011  . Depression 2012  . Fatty liver 03/21/2011  . GERD (gastroesophageal reflux disease)   . Grave's disease 2002   s/p thyroidectomy  . Heart murmur   . History of chicken pox   . HSV-2 (herpes simplex virus 2) infection      Social History   Social History  . Marital status: Married    Spouse name: N/A  . Number of children: 3  . Years of education: N/A   Occupational History  .  [employer]   Social History Main Topics  . Smoking status: Never Smoker  . Smokeless tobacco: Never Used  . Alcohol use 6.0 oz/week    10 Glasses of wine per week  . Drug use: No  . Sexual activity: Yes    Birth control/ protection: Surgical     Comment: btl   Other Topics Concern  . Not on file   Social History Narrative   Regular exercise:  No   Caffeine Use: 2 drinks weekly          Past Surgical History:  Procedure Laterality Date  . CESAREAN SECTION  2001  . CESAREAN SECTION  2003  . CESAREAN SECTION  2007  . THYROIDECTOMY  2003   for graves disease  . TUBAL LIGATION      Family History  Problem Relation Age of Onset  . Alcohol abuse Mother   . Depression Mother   . Bipolar disorder Mother   . Alcohol abuse Father   . Diabetes Father   . Hypertension  Father   . Cancer Maternal Grandmother        colon  . Depression Maternal Grandmother   . Diabetes Maternal Grandmother   . Hypertension Maternal Grandmother   . Cancer Paternal Grandmother        breast  . Diabetes Paternal Grandmother   . Hypertension Paternal Grandmother     No Known Allergies  Current Outpatient Prescriptions on File Prior to Visit  Medication Sig Dispense Refill  . betamethasone dipropionate (DIPROLENE) 0.05 % cream Apply topically 2 (two) times daily. 30 g 1  . carvedilol (COREG) 3.125 MG tablet Take 1 tablet (3.125 mg total) by mouth 2 (two) times daily with a meal.  1  . escitalopram (LEXAPRO) 20 MG tablet TAKE 1 TABLET (20 MG TOTAL) BY MOUTH DAILY. 90 tablet 1  . Multiple Vitamins-Minerals (MULTIVITAMIN WITH MINERALS) tablet Take 1 tablet by mouth daily.    . ranitidine (ZANTAC) 150 MG capsule Take 1 capsule (150 mg total) by mouth 2 (two) times daily. 60 capsule 5  . valACYclovir (VALTREX) 500 MG tablet Take 1 tablet (500 mg total) by mouth daily as needed. [place]  tablet 2   No current facility-administered medications on file prior to visit.     BP 118/87 (BP Location: Right Arm, Cuff Size: Large)   Pulse 82   Temp 98.2 F (36.8 C) (Oral)   Resp 16   Ht 5\' 5"  (1.651 m)   Wt 184 lb (83.5 kg)   LMP 11/05/2016   SpO2 100%   BMI 30.62 kg/m    Objective:   Physical Exam  Constitutional: She is oriented to person, place, and time. She appears well-developed and well-nourished.  Cardiovascular: Normal rate, regular rhythm and normal heart sounds.   No murmur heard. Pulmonary/Chest: Effort normal and breath sounds normal. No respiratory distress. She has no wheezes.  Musculoskeletal: She exhibits no edema.  Neurological: She is alert and oriented to person, place, and time.  Psychiatric: She has a normal mood and affect. Her behavior is normal. Judgment and thought content normal.  skin: small raised bite note right inner upper arm- some mild  surrounding erythema, similar lesion on left dorsal foot near shin and left forearm        Assessment & Plan:  Insect bite- local reaction- no sign of infection.  Advised trial of zyrtec. Pt to call if new/worsening symptoms or if symptoms fail to improve. To to ER if tongue lip swelling, pt verbalizes understanding.

## 2016-11-12 NOTE — Patient Instructions (Signed)
Please continue effexor. Add zyrtec once daily for the bug bites, call if swelling worsens or if it fails to improves.

## 2016-11-24 ENCOUNTER — Other Ambulatory Visit: Payer: Self-pay | Admitting: Family

## 2017-01-06 MED FILL — VALACYCLOVIR HCL 500 MG TAB: 500 | 30 days supply | Qty: 30 | Fill #1

## 2017-02-09 MED FILL — LEVOTHYROXINE 150 MCG TAB: 150 | 90 days supply | Qty: 90 | Fill #1

## 2017-02-09 MED FILL — ESCITALOPRAM 20 MG TABLET: 20 | 90 days supply | Qty: 90 | Fill #1

## 2017-02-14 ENCOUNTER — Ambulatory Visit: Payer: 59 | Admitting: Family

## 2017-02-15 ENCOUNTER — Encounter: Payer: Self-pay | Admitting: Family

## 2017-02-15 ENCOUNTER — Ambulatory Visit: Payer: 59 | Admitting: Family

## 2017-02-15 VITALS — BP 113/74 | HR 88 | Temp 98.2°F | Resp 16 | Ht 65.0 in | Wt 194.0 lb

## 2017-02-15 DIAGNOSIS — I10 Essential (primary) hypertension: Secondary | ICD-10-CM | POA: Diagnosis not present

## 2017-02-15 DIAGNOSIS — M7661 Achilles tendinitis, right leg: Secondary | ICD-10-CM | POA: Diagnosis not present

## 2017-02-15 DIAGNOSIS — F418 Other specified anxiety disorders: Secondary | ICD-10-CM | POA: Diagnosis not present

## 2017-02-15 NOTE — Progress Notes (Signed)
Subjective:    Patient ID: Cheyenne Hawkins, female    DOB: 1975-08-18, 42 y.o.   MRN: [id number]  HPI   Patient is a 42 yr old female who presents today for follow up of her depression/anixety. She is maintained on lexapro. Reports that she stopped effexor several months back because she was concerned about her blood pressure. Reports that her mood is good despite stress with her dad having a stroke.   HTN- maintained on coreg.   BP Readings from Last 3 Encounters:  02/15/17 113/74  11/12/16 118/87  10/08/16 125/80   Achilles tendon on the right, worse with prolonged sitting.  Now has a stand up desk which helps.   Review of Systems    see HPI  Past Medical History:  Diagnosis Date  . Anemia, iron deficiency 11/19/2011  . Depression 2012  . Fatty liver 03/21/2011  . GERD (gastroesophageal reflux disease)   . Grave's disease 2002   s/p thyroidectomy  . Heart murmur   . History of chicken pox   . HSV-2 (herpes simplex virus 2) infection      Social History   Socioeconomic History  . Marital status: Married    Spouse name: Not on file  . Number of children: 3  . Years of education: Not on file  . Highest education level: Not on file  Social Needs  . Financial resource strain: Not on file  . Food insecurity - worry: Not on file  . Food insecurity - inability: Not on file  . Transportation needs - medical: Not on file  . Transportation needs - non-medical: Not on file  Occupational History    Employer: [employer]  Tobacco Use  . Smoking status: Never Smoker  . Smokeless tobacco: Never Used  Substance and Sexual Activity  . Alcohol use: Yes    Alcohol/week: 6.0 oz    Types: 10 Glasses of wine per week  . Drug use: No  . Sexual activity: Yes    Birth control/protection: Surgical    Comment: btl  Other Topics Concern  . Not on file  Social History Narrative   Regular exercise:  No   Caffeine Use: 2 drinks weekly          Past Surgical History:    Procedure Laterality Date  . CESAREAN SECTION  2001  . CESAREAN SECTION  2003  . CESAREAN SECTION  2007  . THYROIDECTOMY  2003   for graves disease  . TUBAL LIGATION      Family History  Problem Relation Age of Onset  . Alcohol abuse Mother   . Depression Mother   . Bipolar disorder Mother   . Alcohol abuse Father   . Diabetes Father   . Hypertension Father   . Cancer Maternal Grandmother        colon  . Depression Maternal Grandmother   . Diabetes Maternal Grandmother   . Hypertension Maternal Grandmother   . Cancer Paternal Grandmother        breast  . Diabetes Paternal Grandmother   . Hypertension Paternal Grandmother     No Known Allergies  Current Outpatient Medications on File Prior to Visit  Medication Sig Dispense Refill  . betamethasone dipropionate (DIPROLENE) 0.05 % cream APPLY EXTERNALLY TO THE AFFECTED AREA TWICE A DAY 30 g 1  . carvedilol (COREG) 3.125 MG tablet Take 1 tablet (3.125 mg total) by mouth 2 (two) times daily with a meal.  1  . escitalopram (LEXAPRO) 20 MG  tablet TAKE 1 TABLET (20 MG TOTAL) BY MOUTH DAILY. 90 tablet 1  . levothyroxine (SYNTHROID, LEVOTHROID) 150 MCG tablet Take 1 tablet (150 mcg total) by mouth daily. 90 tablet 1  . Multiple Vitamins-Minerals (MULTIVITAMIN WITH MINERALS) tablet Take 1 tablet by mouth daily.    . ranitidine (ZANTAC) 150 MG capsule Take 1 capsule (150 mg total) by mouth 2 (two) times daily. 60 capsule 5  . valACYclovir (VALTREX) 500 MG tablet Take 1 tablet (500 mg total) by mouth daily as needed. 30 tablet 2  . venlafaxine XR (EFFEXOR-XR) 150 MG 24 hr capsule Take 1 capsule (150 mg total) by mouth daily with breakfast. 90 capsule 1   No current facility-administered medications on file prior to visit.     BP 113/74 (BP Location: Right Arm, Patient Position: Sitting, Cuff Size: Large)   Pulse 88   Temp 98.2 F (36.8 C) (Oral)   Resp 16   Ht 5\' 5"  (1.651 m)   Wt 194 lb (88 kg)   LMP 02/01/2017   SpO2 100%    BMI 32.28 kg/m    Objective:   Physical Exam  Constitutional: She is oriented to person, place, and time. She appears well-developed and well-nourished.  HENT:  Head: Normocephalic and atraumatic.  Cardiovascular: Normal rate, regular rhythm and normal heart sounds.  No murmur heard. Pulmonary/Chest: Effort normal and breath sounds normal. No respiratory distress. She has no wheezes.  Neurological: She is alert and oriented to person, place, and time.  Psychiatric: She has a normal mood and affect. Her behavior is normal. Judgment and thought content normal.          Assessment & Plan:  Depression/anxiety- stable on current dose of lexapro, continue same.  Achilles tendonitis- new. advised stretching, icing short course of aleve. If symptoms worsen or fail to improve plan referral to sports medicine.   HTN- stable on current meds. Continue same.

## 2017-05-20 ENCOUNTER — Ambulatory Visit: Payer: 59 | Admitting: Family

## 2017-05-30 ENCOUNTER — Other Ambulatory Visit (HOSPITAL_COMMUNITY)
Admission: RE | Admit: 2017-05-30 | Discharge: 2017-05-30 | Disposition: A | Discharge status: Home / Self Care | Payer: 59 | Source: Ambulatory Visit | Attending: Family | Admitting: Family

## 2017-05-30 ENCOUNTER — Encounter: Payer: Self-pay | Admitting: Family

## 2017-05-30 ENCOUNTER — Ambulatory Visit (INDEPENDENT_AMBULATORY_CARE_PROVIDER_SITE_OTHER): Payer: 59 | Admitting: Family

## 2017-05-30 VITALS — BP 100/70 | HR 93 | Temp 98.1°F | Resp 18 | Ht 65.0 in | Wt 198.0 lb

## 2017-05-30 DIAGNOSIS — Z1151 Encounter for screening for human papillomavirus (HPV): Secondary | ICD-10-CM | POA: Diagnosis not present

## 2017-05-30 DIAGNOSIS — Z01419 Encounter for gynecological examination (general) (routine) without abnormal findings: Secondary | ICD-10-CM | POA: Insufficient documentation

## 2017-05-30 DIAGNOSIS — Z0000 Encounter for general adult medical examination without abnormal findings: Secondary | ICD-10-CM | POA: Diagnosis not present

## 2017-05-30 MED ORDER — VALACYCLOVIR HCL 500 MG PO TABS: 500.0000 mg | ORAL_TABLET | Freq: Every day | ORAL | 2 refills | Status: DC | PRN

## 2017-05-30 MED ORDER — ESCITALOPRAM OXALATE 20 MG PO TABS: ORAL_TABLET | ORAL | 1 refills | Status: DC

## 2017-05-30 MED ORDER — LEVOTHYROXINE SODIUM 150 MCG PO TABS: 150.0000 ug | ORAL_TABLET | Freq: Every day | ORAL | 1 refills | Status: DC

## 2017-05-30 MED ORDER — RANITIDINE HCL 150 MG PO CAPS: 150.0000 mg | ORAL_CAPSULE | Freq: Two times a day (BID) | ORAL | 1 refills | Status: DC

## 2017-05-30 MED FILL — VALACYCLOVIR HCL 500 MG TAB: 500 | 30 days supply | Qty: 30 | Fill #0

## 2017-05-30 MED FILL — ESCITALOPRAM 20 MG TABLET: 20 | 90 days supply | Qty: 90 | Fill #0

## 2017-05-30 MED FILL — LEVOTHYROXINE 150 MCG TAB: 150 | 90 days supply | Qty: 90 | Fill #0

## 2017-05-30 MED FILL — raNITIdine HCL 150 MG TABS: 150 | 90 days supply | Qty: 180 | Fill #0

## 2017-05-30 NOTE — Patient Instructions (Addendum)
Tessa Lerner MD (weight loss pod cast). Try to add 30 minutes of exercise 5 days a week.  Complete lab work prior to leaving .

## 2017-05-30 NOTE — Progress Notes (Signed)
Subjective:    Patient ID: Cheyenne Hawkins, female    DOB: Nov 13, 1975, 42 y.o.   MRN: [id number]  HPI  Patient presents today for complete physical.  Immunizations: tetanus 09/11/13 Diet:needs improvement.  Needs to make improved chices Wt Readings from Last 3 Encounters:  05/30/17 198 lb (89.8 kg)  02/15/17 194 lb (88 kg)  11/12/16 184 lb (83.5 kg)  Exercise:  Parking further away, intentional  Pap Smear: 01/11/14- due Mammogram: 09/20/16 Vision:  2 yrs ago Dental:  Up to date      Review of Systems  Constitutional: Negative for unexpected weight change.  HENT: Negative for hearing loss and rhinorrhea.   Eyes:       Some issues with reading  Respiratory: Negative for cough.   Cardiovascular: Negative for leg swelling.  Gastrointestinal: Positive for diarrhea.       Mild constipation   Genitourinary: Negative for dysuria and frequency.  Musculoskeletal:       Knee pain bilateral  Skin: Negative for rash.  Neurological: Negative for headaches.  Hematological: Negative for adenopathy.  Psychiatric/Behavioral:       Denies depression/anxiety-        Past Medical History:  Diagnosis Date  . Anemia, iron deficiency 11/19/2011  . Depression 2012  . Fatty liver 03/21/2011  . GERD (gastroesophageal reflux disease)   . Grave's disease 2002   s/p thyroidectomy  . Heart murmur   . History of chicken pox   . HSV-2 (herpes simplex virus 2) infection      Social History   Socioeconomic History  . Marital status: Married    Spouse name: Not on file  . Number of children: 3  . Years of education: Not on file  . Highest education level: Not on file  Occupational History    Employer: [employer]  . Financial resource strain: Not on file  . Food insecurity:    Worry: Not on file    Inability: Not on file  . Transportation needs:    Medical: Not on file    Non-medical: Not on file  Tobacco Use  . Smoking status: Never Smoker  . Smokeless tobacco:  Never Used  Substance and Sexual Activity  . Alcohol use: Yes    Alcohol/week: 6.0 oz    Types: 10 Glasses of wine per week  . Drug use: No  . Sexual activity: Yes    Birth control/protection: Surgical    Comment: btl  Lifestyle  . Physical activity:    Days per week: Not on file    Minutes per session: Not on file  . Stress: Not on file  Relationships  . Social connections:    Talks on phone: Not on file    Gets together: Not on file    Attends religious service: Not on file    Active member of club or organization: Not on file    Attends meetings of clubs or organizations: Not on file    Relationship status: Not on file  . Intimate partner violence:    Fear of current or ex partner: Not on file    Emotionally abused: Not on file    Physically abused: Not on file    Forced sexual activity: Not on file  Other Topics Concern  . Not on file  Social History Narrative   Regular exercise:  No   Caffeine Use: 2 drinks weekly          Past Surgical History:  Procedure Laterality Date  . CESAREAN SECTION  2001  . CESAREAN SECTION  2003  . CESAREAN SECTION  2007  . THYROIDECTOMY  2003   for graves disease  . TUBAL LIGATION      Family History  Problem Relation Age of Onset  . Alcohol abuse Mother   . Depression Mother   . Bipolar disorder Mother   . Alcohol abuse Father   . Diabetes Father   . Hypertension Father   . Cancer Maternal Grandmother        colon  . Depression Maternal Grandmother   . Diabetes Maternal Grandmother   . Hypertension Maternal Grandmother   . Cancer Paternal Grandmother        breast  . Diabetes Paternal Grandmother   . Hypertension Paternal Grandmother     No Known Allergies  Current Outpatient Medications on File Prior to Visit  Medication Sig Dispense Refill  . escitalopram (LEXAPRO) 20 MG tablet TAKE 1 TABLET (20 MG TOTAL) BY MOUTH DAILY. 90 tablet 1  . levothyroxine (SYNTHROID, LEVOTHROID) 150 MCG tablet Take 1 tablet (150 mcg  total) by mouth daily. 90 tablet 1  . Multiple Vitamins-Minerals (MULTIVITAMIN WITH MINERALS) tablet Take 1 tablet by mouth daily.    . ranitidine (ZANTAC) 150 MG capsule Take 1 capsule (150 mg total) by mouth 2 (two) times daily. 60 capsule 5  . valACYclovir (VALTREX) 500 MG tablet Take 1 tablet (500 mg total) by mouth daily as needed. 30 tablet 2  . carvedilol (COREG) 3.125 MG tablet Take 1 tablet (3.125 mg total) by mouth 2 (two) times daily with a meal. (Patient not taking: Reported on 05/30/2017)  1   No current facility-administered medications on file prior to visit.     BP 100/70 (BP Location: Right Arm, Cuff Size: Large)   Pulse 93   Temp 98.1 F (36.7 C) (Oral)   Resp 18   Ht 5\' 5"  (1.651 m)   Wt 198 lb (89.8 kg)   LMP 05/23/2017   SpO2 100%   BMI 32.95 kg/m    Objective:   Physical Exam Physical Exam  Constitutional: She is oriented to person, place, and time. She appears well-developed and well-nourished. No distress.  HENT:  Head: Normocephalic and atraumatic.  Right Ear: Tympanic membrane and ear canal normal.  Left Ear: Tympanic membrane and ear canal normal.  Mouth/Throat: Oropharynx is clear and moist.  Eyes: Pupils are equal, round, and reactive to light. No scleral icterus.  Neck: Normal range of motion. No thyromegaly present.  Cardiovascular: Normal rate and regular rhythm.   No murmur heard. Pulmonary/Chest: Effort normal and breath sounds normal. No respiratory distress. He has no wheezes. She has no rales. She exhibits no tenderness.  Abdominal: Soft. Bowel sounds are normal. She exhibits no distension and no mass. There is no tenderness. There is no rebound and no guarding.  Musculoskeletal: She exhibits no edema.  Lymphadenopathy:    She has no cervical adenopathy.  Neurological: She is alert and oriented to person, place, and time. She has normal patellar reflexes. She exhibits normal muscle tone. Coordination normal.  Skin: Skin is warm and dry.    Psychiatric: She has a normal mood and affect. Her behavior is normal. Judgment and thought content normal.  Breasts: Examined lying Right: Without masses, retractions, discharge or axillary adenopathy.  Left: Without masses, retractions, discharge or axillary adenopathy.  Inguinal/mons: Normal without inguinal adenopathy  External genitalia: Normal  BUS/Urethra/Skene's glands: Normal  Bladder: Normal  Vagina:  Normal  Cervix: Normal  Uterus: normal in size, shape and contour. Midline and mobile  Adnexa/parametria:  Rt: Without masses or tenderness.  Lt: Without masses or tenderness.  Anus and perineum: Normal            Assessment & Plan:   Preventative care- pap performed today will send for cotesting. Obtain routine lab work. Immunizations reviewed and up to date.         Assessment & Plan:

## 2017-05-31 LAB — CBC WITH DIFFERENTIAL/PLATELET
BASOS ABS: 0.1 10*3/uL (ref 0.0–0.1)
Basophils Relative: 0.9 % (ref 0.0–3.0)
EOS ABS: 0.1 10*3/uL (ref 0.0–0.7)
Eosinophils Relative: 1.1 % (ref 0.0–5.0)
HCT: 35.8 % — ABNORMAL LOW (ref 36.0–46.0)
Hemoglobin: 11.6 g/dL — ABNORMAL LOW (ref 12.0–15.0)
LYMPHS ABS: 2.1 10*3/uL (ref 0.7–4.0)
Lymphocytes Relative: 28.3 % (ref 12.0–46.0)
MCHC: 32.4 g/dL (ref 30.0–36.0)
MCV: 86.2 fl (ref 78.0–100.0)
Monocytes Absolute: 0.8 10*3/uL (ref 0.1–1.0)
Monocytes Relative: 10.6 % (ref 3.0–12.0)
NEUTROS ABS: 4.3 10*3/uL (ref 1.4–7.7)
Neutrophils Relative %: 59.1 % (ref 43.0–77.0)
PLATELETS: 358 10*3/uL (ref 150.0–400.0)
RBC: 4.15 Mil/uL (ref 3.87–5.11)
RDW: 14.7 % (ref 11.5–15.5)
WBC: 7.3 10*3/uL (ref 4.0–10.5)

## 2017-05-31 LAB — URINALYSIS, ROUTINE W REFLEX MICROSCOPIC
BILIRUBIN URINE: NEGATIVE
Hgb urine dipstick: NEGATIVE
KETONES UR: NEGATIVE
Leukocytes, UA: NEGATIVE
Nitrite: NEGATIVE
PH: 6 (ref 5.0–8.0)
RBC / HPF: NONE SEEN (ref 0–?)
Total Protein, Urine: NEGATIVE
URINE GLUCOSE: NEGATIVE
UROBILINOGEN UA: 0.2 (ref 0.0–1.0)
WBC UA: NONE SEEN (ref 0–?)

## 2017-05-31 LAB — BASIC METABOLIC PANEL
BUN: 14 mg/dL (ref 6–23)
CHLORIDE: 100 meq/L (ref 96–112)
CO2: 28 mEq/L (ref 19–32)
Calcium: 9.3 mg/dL (ref 8.4–10.5)
Creatinine, Ser: 0.49 mg/dL (ref 0.40–1.20)
GFR: 176.98 mL/min (ref >60.00)
GLUCOSE: 98 mg/dL (ref 70–99)
POTASSIUM: 4 meq/L (ref 3.5–5.1)
SODIUM: 135 meq/L (ref 135–145)

## 2017-05-31 LAB — TSH: TSH: 5.1 u[IU]/mL — ABNORMAL HIGH (ref 0.35–4.50)

## 2017-05-31 LAB — LIPID PANEL
Cholesterol: 142 mg/dL (ref 0–200)
HDL: 59.2 mg/dL (ref >39.00)
LDL Cholesterol: 68 mg/dL (ref 0–99)
NONHDL: 83.18
TRIGLYCERIDES: 77 mg/dL (ref 0.0–149.0)
Total CHOL/HDL Ratio: 2
VLDL: 15.4 mg/dL (ref 0.0–40.0)

## 2017-05-31 LAB — HEPATIC FUNCTION PANEL
ALBUMIN: 4 g/dL (ref 3.5–5.2)
ALK PHOS: 70 U/L (ref 39–117)
ALT: 16 U/L (ref 0–35)
AST: 18 U/L (ref 0–37)
Bilirubin, Direct: 0.1 mg/dL (ref 0.0–0.3)
Total Bilirubin: 0.4 mg/dL (ref 0.2–1.2)
Total Protein: 7.5 g/dL (ref 6.0–8.3)

## 2017-06-01 ENCOUNTER — Other Ambulatory Visit (INDEPENDENT_AMBULATORY_CARE_PROVIDER_SITE_OTHER): Payer: 59

## 2017-06-01 ENCOUNTER — Encounter: Payer: Self-pay | Admitting: Family

## 2017-06-01 DIAGNOSIS — D649 Anemia, unspecified: Secondary | ICD-10-CM

## 2017-06-01 DIAGNOSIS — E039 Hypothyroidism, unspecified: Secondary | ICD-10-CM

## 2017-06-01 LAB — CYTOLOGY - PAP
Adequacy: ABSENT
DIAGNOSIS: NEGATIVE
HPV: NOT DETECTED

## 2017-06-01 LAB — IRON: Iron: 58 ug/dL (ref 42–145)

## 2017-06-01 LAB — FERRITIN: FERRITIN: 4.6 ng/mL — AB (ref 10.0–291.0)

## 2017-06-03 ENCOUNTER — Telehealth: Payer: Self-pay | Admitting: Family

## 2017-06-03 DIAGNOSIS — R9431 Abnormal electrocardiogram [ECG] [EKG]: Secondary | ICD-10-CM

## 2017-06-03 NOTE — Telephone Encounter (Signed)
Lm for patient to call back on my extension

## 2017-06-03 NOTE — Telephone Encounter (Signed)
Please contact pt and let her know that I reviewed the EKG that we did at her physical.  It was mildly abnormal.  To be thorough I would like her to meet with cardiology for consult and they will determine if she needs any additional work up.  Referral is pended.

## 2017-06-04 ENCOUNTER — Encounter: Payer: Self-pay | Admitting: Family

## 2017-06-06 ENCOUNTER — Encounter: Payer: Self-pay | Admitting: Family

## 2017-06-07 NOTE — Telephone Encounter (Signed)
Melissa -- please see iron and ferritin results that were added on and advise?

## 2017-06-07 NOTE — Addendum Note (Signed)
Addended by: Kelle Darting A on: 06/07/2017 05:42 PM   Modules accepted: Orders

## 2017-06-07 NOTE — Telephone Encounter (Signed)
Notified pt. She states she has seen Dr Doylene Canard in the past but she would be glad to see someone within [facility] for consultation so she can keep her records within the same group as PCP. Referral signed.

## 2017-06-16 ENCOUNTER — Encounter: Payer: Self-pay | Admitting: Cardiology

## 2017-06-16 ENCOUNTER — Ambulatory Visit: Payer: 59 | Admitting: Cardiology

## 2017-06-16 VITALS — BP 102/66 | HR 70 | Ht 64.0 in | Wt 196.4 lb

## 2017-06-16 DIAGNOSIS — E039 Hypothyroidism, unspecified: Secondary | ICD-10-CM

## 2017-06-16 DIAGNOSIS — R9431 Abnormal electrocardiogram [ECG] [EKG]: Secondary | ICD-10-CM

## 2017-06-16 DIAGNOSIS — I341 Nonrheumatic mitral (valve) prolapse: Secondary | ICD-10-CM

## 2017-06-16 NOTE — Patient Instructions (Addendum)
Medication Instructions:  Your physician recommends that you continue on your current medications as directed. Please refer to the Current Medication list given to you today.   Labwork: None  Testing/Procedures: You had an EKG today.   Your physician has requested that you have an echocardiogram. Echocardiography is a painless test that uses sound waves to create images of your heart. It provides your doctor with information about the size and shape of your heart and how well your heart's chambers and valves are working. This procedure takes approximately one hour. There are no restrictions for this procedure.   Follow-Up: Your physician recommends that you schedule a follow-up appointment as needed if symptoms worsen or fail to improve.   If you need a refill on your cardiac medications before your next appointment, please call your pharmacy.   Thank you for choosing [facility]! Robyne Peers, RN [phone]

## 2017-06-16 NOTE — Addendum Note (Signed)
Addended by: Austin Miles on: 06/16/2017 10:55 AM   Modules accepted: Orders

## 2017-06-16 NOTE — Progress Notes (Signed)
Cardiology Consultation:    Date:  06/16/2017   ID:  Cheyenne Hawkins, DOB 1975-10-29, MRN [id number]  PCP:  Debbrah Alar, NP  Cardiologist:  Jenne Campus, MD   Referring MD: Debbrah Alar, NP   Chief Complaint  Patient presents with  . Abnormal ECG  Have abnormal EKG  History of Present Illness:    Cheyenne Hawkins is a 42 y.o. female who is being seen today for the evaluation of abnormal EKG at the request of Debbrah Alar, NP.  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  She went for regular checkup to her primary care physician EKG was done she was find to have Q waves in lead V1 V2 she was asked to follow-up with [place].  Luckily overall she is doing very well she goes to gym on the regular basis and exercise have no difficulty doing it there is no chest pain tightness squeezing pressure burning chest.  Close to 10 years ago she was told to have mitral valve prolapse.  At that time she got some palpitations she was also find to have hyperthyroidism.  She had episode of atrial fibrillation her thyroid has been taking care of them since that time she is been doing well denies having any palpitations.  At that time she ended up having stress test as well as echocardiogram both were negative.  She also complained of having some chest pain then but since that time there is no chest pain.  Overall she is doing well  Past Medical History:  Diagnosis Date  . Anemia, iron deficiency 11/19/2011  . Depression 2012  . Fatty liver 03/21/2011  . GERD (gastroesophageal reflux disease)   . Grave's disease 2002   s/p thyroidectomy  . Heart murmur   . History of chicken pox   . HSV-2 (herpes simplex virus 2) infection     Past Surgical History:  Procedure Laterality Date  . CESAREAN SECTION  2001  . CESAREAN SECTION  2003  . CESAREAN SECTION  2007  . THYROIDECTOMY  2003   for graves disease  . TUBAL LIGATION       Current Medications: Current Meds  Medication Sig  . escitalopram (LEXAPRO) 20 MG tablet TAKE 1 TABLET (20 MG TOTAL) BY MOUTH DAILY.  Marland Kitchen levothyroxine (SYNTHROID, LEVOTHROID) 150 MCG tablet Take 1 tablet (150 mcg total) by mouth daily.  . Multiple Vitamins-Minerals (MULTIVITAMIN WITH MINERALS) tablet Take 1 tablet by mouth daily.  . ranitidine (ZANTAC) 150 MG capsule Take 1 capsule (150 mg total) by mouth 2 (two) times daily.  . valACYclovir (VALTREX) 500 MG tablet Take 1 tablet (500 mg total) by mouth daily as needed.     Allergies:   Patient has no known allergies.   Social History   Socioeconomic History  . Marital status: Married    Spouse name: Not on file  . Number of children: 3  . Years of education: Not on file  . Highest education level: Not on file  Occupational History    Employer: [employer]  . Financial resource strain: Not on file  . Food insecurity:    Worry: Not on file    Inability: Not on file  . Transportation needs:    Medical: Not on file    Non-medical: Not on file  Tobacco Use  . Smoking status: Never Smoker  . Smokeless tobacco: Never Used  Substance and  Sexual Activity  . Alcohol use: Yes    Alcohol/week: 6.0 oz    Types: 10 Glasses of wine per week  . Drug use: No  . Sexual activity: Yes    Birth control/protection: Surgical    Comment: btl  Lifestyle  . Physical activity:    Days per week: Not on file    Minutes per session: Not on file  . Stress: Not on file  Relationships  . Social connections:    Talks on phone: Not on file    Gets together: Not on file    Attends religious service: Not on file    Active member of club or organization: Not on file    Attends meetings of clubs or organizations: Not on file    Relationship status: Not on file  Other Topics Concern  . Not on file  Social History Narrative   Regular exercise:  No   Caffeine Use: 2 drinks weekly           Family History: The patient's family  history includes Alcohol abuse in her father and mother; Bipolar disorder in her mother; Cancer in her maternal grandmother and paternal grandmother; Depression in her maternal grandmother and mother; Diabetes in her father, maternal grandmother, and paternal grandmother; Hypertension in her father, maternal grandmother, and paternal grandmother. ROS:   Please see the history of present illness.    All 14 point review of systems negative except as described per history of present illness.  EKGs/Labs/Other Studies Reviewed:    The following studies were reviewed today:   EKG:  EKG is  ordered today.  The ekg ordered today demonstrates normal sinus rhythm normal P interval Q waves in lead V1 V2 raising suspicion for anterior septal wall myocardial infarction  Recent Labs: 05/30/2017: ALT 16; BUN 14; Creatinine, Ser 0.49; Hemoglobin 11.6; Platelets 358.0; Potassium 4.0; Sodium 135; TSH 5.10  Recent Lipid Panel    Component Value Date/Time   CHOL 142 05/30/2017 1609   TRIG 77.0 05/30/2017 1609   HDL 59.20 05/30/2017 1609   CHOLHDL 2 05/30/2017 1609   VLDL 15.4 05/30/2017 1609   LDLCALC 68 05/30/2017 1609    Physical Exam:    VS:  BP 102/66   Pulse 70   Ht 5\' 4"  (1.626 m)   Wt 196 lb 6.4 oz (89.1 kg)   LMP 05/23/2017   SpO2 98%   BMI 33.71 kg/m     Wt Readings from Last 3 Encounters:  06/16/17 196 lb 6.4 oz (89.1 kg)  05/30/17 198 lb (89.8 kg)  02/15/17 194 lb (88 kg)     GEN:  Well nourished, well developed in no acute distress HEENT: Normal NECK: No JVD; No carotid bruits LYMPHATICS: No lymphadenopathy CARDIAC: RRR, no click no murmur, no rubs, no gallops RESPIRATORY:  Clear to auscultation without rales, wheezing or rhonchi  ABDOMEN: Soft, non-tender, non-distended MUSCULOSKELETAL:  No edema; No deformity  SKIN: Warm and dry NEUROLOGIC:  Alert and oriented x 3 PSYCHIATRIC:  Normal affect   ASSESSMENT:    1. Hypothyroidism, unspecified type   2. Abnormal EKG   3.  Mitral valve prolapse    PLAN:    In order of problems listed above:  1. Abnormal EKG: Q waves in V1 V2 suspicion for anterior septal microinfarction likely lady does not have any symptoms to suggest this.  I think the best course of action will be to do echocardiogram to check to make sure there is no hypokinesis in the  septum.  If that test is negative I think we can drop the tissue.  I will give her a copy of her EKG so she will have for her reference.  Based on the results of the echocardiogram will decide what to do next. 2. History of mitral valve prolapse we will do echocardiogram to look at this as well.  She does not have any palpitations therefore there is no treatment needed for her mitral valve prolapse. 3. Hypothyroidism: That being addressed by internal medicine team.  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  She is very intelligent and know the right staff that she needs to do.  She try to eat right she exercised on the regular basis clinic [facility] to keep doing it.   Medication Adjustments/Labs and Tests Ordered: Current medicines are reviewed at length with the patient today.  Concerns regarding medicines are outlined above.  No orders of the defined types were placed in this encounter.  No orders of the defined types were placed in this encounter.   Signed, Park Liter, MD, [facility]. 06/16/2017 10:47 AM    [place]

## 2017-07-08 ENCOUNTER — Ambulatory Visit (HOSPITAL_BASED_OUTPATIENT_CLINIC_OR_DEPARTMENT_OTHER)
Admission: RE | Admit: 2017-07-08 | Discharge: 2017-07-08 | Disposition: A | Discharge status: Home / Self Care | Payer: 59 | Source: Ambulatory Visit | Attending: Cardiology | Admitting: Cardiology

## 2017-07-08 DIAGNOSIS — I341 Nonrheumatic mitral (valve) prolapse: Secondary | ICD-10-CM | POA: Insufficient documentation

## 2017-07-08 DIAGNOSIS — R9431 Abnormal electrocardiogram [ECG] [EKG]: Secondary | ICD-10-CM | POA: Diagnosis not present

## 2017-07-08 NOTE — Progress Notes (Signed)
Echocardiogram 2D Echocardiogram has been performed.  Cheyenne Hawkins 07/08/2017, 10:04 AM

## 2017-07-15 ENCOUNTER — Other Ambulatory Visit: Payer: 59

## 2017-07-19 ENCOUNTER — Encounter: Payer: Self-pay | Admitting: Family

## 2017-07-26 ENCOUNTER — Other Ambulatory Visit (INDEPENDENT_AMBULATORY_CARE_PROVIDER_SITE_OTHER): Payer: 59

## 2017-07-26 ENCOUNTER — Ambulatory Visit: Payer: 59 | Admitting: Cardiology

## 2017-07-26 ENCOUNTER — Encounter: Payer: Self-pay | Admitting: Cardiology

## 2017-07-26 VITALS — BP 120/70 | HR 88 | Ht 64.0 in | Wt 198.4 lb

## 2017-07-26 DIAGNOSIS — Z0000 Encounter for general adult medical examination without abnormal findings: Secondary | ICD-10-CM | POA: Diagnosis not present

## 2017-07-26 DIAGNOSIS — I480 Paroxysmal atrial fibrillation: Secondary | ICD-10-CM

## 2017-07-26 DIAGNOSIS — E039 Hypothyroidism, unspecified: Secondary | ICD-10-CM

## 2017-07-26 DIAGNOSIS — R000 Tachycardia, unspecified: Secondary | ICD-10-CM | POA: Diagnosis not present

## 2017-07-26 DIAGNOSIS — I341 Nonrheumatic mitral (valve) prolapse: Secondary | ICD-10-CM

## 2017-07-26 NOTE — Patient Instructions (Signed)

## 2017-07-26 NOTE — Progress Notes (Signed)
Cardiology Office Note:    Date:  07/26/2017   ID:  Cheyenne Hawkins, DOB [birth year], MRN [id number]  PCP:  Debbrah Alar, NP  Cardiologist:  Jenne Campus, MD    Referring MD: Debbrah Alar, NP   Chief Complaint  Patient presents with  . Follow up on Echo  Doing well  History of Present Illness:    Cheyenne Hawkins is a 42 y.o. female with remote history of proximal atrial fibrillation while she was having hyperthyroidism, also history of mitral valve prolapse came to our office for evaluation.  She did have echocardiogram which was normal her electrocardiogram showed Q waves in lead V1 V2 raising suspicion for anterior septal wall MI but echocardiogram showed preserved normal left ventricular ejection fraction I think we can drop the tissue.  We will give her a copy of her EKG so she can have it with her.  I did look at echocardiogram carefully looking for mitral valve prolapse I did not see any evidence of it which tells me that it was very small insignificant simply incorrect diagnosis.  She was very much concerned about relaxation abnormality explained to her what that means I told her I am not that much concerned about this.  We did talk also about healthy lifestyle losing weight exercises on the regular basis which I strongly advised him to do  Past Medical History:  Diagnosis Date  . Anemia, iron deficiency 11/19/2011  . Depression 2012  . Fatty liver 03/21/2011  . GERD (gastroesophageal reflux disease)   . Grave's disease 2002   s/p thyroidectomy  . Heart murmur   . History of chicken pox   . HSV-2 (herpes simplex virus 2) infection     Past Surgical History:  Procedure Laterality Date  . CESAREAN SECTION  2001  . CESAREAN SECTION  2003  . CESAREAN SECTION  2007  . THYROIDECTOMY  2003   for graves disease  . TUBAL LIGATION      Current Medications: Current Meds  Medication Sig  . escitalopram (LEXAPRO) 20 MG tablet TAKE 1 TABLET (20 MG TOTAL) BY  MOUTH DAILY.  Marland Kitchen levothyroxine (SYNTHROID, LEVOTHROID) 150 MCG tablet Take 1 tablet (150 mcg total) by mouth daily.  . Multiple Vitamins-Minerals (MULTIVITAMIN WITH MINERALS) tablet Take 1 tablet by mouth daily.  . ranitidine (ZANTAC) 150 MG capsule Take 1 capsule (150 mg total) by mouth 2 (two) times daily.  . valACYclovir (VALTREX) 500 MG tablet Take 1 tablet (500 mg total) by mouth daily as needed.     Allergies:   Patient has no known allergies.   Social History   Socioeconomic History  . Marital status: Married    Spouse name: Not on file  . Number of children: 3  . Years of education: Not on file  . Highest education level: Not on file  Occupational History    Employer: [employer]  . Financial resource strain: Not on file  . Food insecurity:    Worry: Not on file    Inability: Not on file  . Transportation needs:    Medical: Not on file    Non-medical: Not on file  Tobacco Use  . Smoking status: Never Smoker  . Smokeless tobacco: Never Used  Substance and Sexual Activity  . Alcohol use: Yes    Alcohol/week: 6.0 oz    Types: 10 Glasses of wine per week  . Drug use: No  . Sexual activity: Yes    Birth control/protection: Surgical  Comment: btl  Lifestyle  . Physical activity:    Days per week: Not on file    Minutes per session: Not on file  . Stress: Not on file  Relationships  . Social connections:    Talks on phone: Not on file    Gets together: Not on file    Attends religious service: Not on file    Active member of club or organization: Not on file    Attends meetings of clubs or organizations: Not on file    Relationship status: Not on file  Other Topics Concern  . Not on file  Social History Narrative   Regular exercise:  No   Caffeine Use: 2 drinks weekly           Family History: The patient's family history includes Alcohol abuse in her father and mother; Bipolar disorder in her mother; Cancer in her maternal grandmother and  paternal grandmother; Depression in her maternal grandmother and mother; Diabetes in her father, maternal grandmother, and paternal grandmother; Hypertension in her father, maternal grandmother, and paternal grandmother. ROS:   Please see the history of present illness.    All 14 point review of systems negative except as described per history of present illness  EKGs/Labs/Other Studies Reviewed:      Recent Labs: 05/30/2017: ALT 16; BUN 14; Creatinine, Ser 0.49; Hemoglobin 11.6; Platelets 358.0; Potassium 4.0; Sodium 135; TSH 5.10  Recent Lipid Panel    Component Value Date/Time   CHOL 142 05/30/2017 1609   TRIG 77.0 05/30/2017 1609   HDL 59.20 05/30/2017 1609   CHOLHDL 2 05/30/2017 1609   VLDL 15.4 05/30/2017 1609   LDLCALC 68 05/30/2017 1609    Physical Exam:    VS:  BP 120/70   Pulse 88   Ht 5\' 4"  (1.626 m)   Wt 198 lb 6.4 oz (90 kg)   SpO2 98%   BMI 34.06 kg/m     Wt Readings from Last 3 Encounters:  07/26/17 198 lb 6.4 oz (90 kg)  06/16/17 196 lb 6.4 oz (89.1 kg)  05/30/17 198 lb (89.8 kg)     GEN:  Well nourished, well developed in no acute distress HEENT: Normal NECK: No JVD; No carotid bruits LYMPHATICS: No lymphadenopathy CARDIAC: RRR, no murmurs, no rubs, no gallops RESPIRATORY:  Clear to auscultation without rales, wheezing or rhonchi  ABDOMEN: Soft, non-tender, non-distended MUSCULOSKELETAL:  No edema; No deformity  SKIN: Warm and dry LOWER EXTREMITIES: no swelling NEUROLOGIC:  Alert and oriented x 3 PSYCHIATRIC:  Normal affect   ASSESSMENT:    1. Mitral valve prolapse   2. Hypothyroidism, unspecified type   3. Tachycardia   4. Routine general medical examination at a health care facility   5. Paroxysmal atrial fibrillation (HCC)    PLAN:    In order of problems listed above:  1. Mitral valve prolapse.  None on the echocardiogram. 2. Hypothyroidism followed by internal medicine team. 3. Tachycardia doing well from that point. 4. Remote  history of proximal mitral fibrillation while she was having hyperthyroidism.  Does have problem anymore. 5. Abnormal EKG raising suspicion for anterior septal wall microinfarction but her echocardiogram was normal.  See her back in my office in 6 months sooner if she get a problem   Medication Adjustments/Labs and Tests Ordered: Current medicines are reviewed at length with the patient today.  Concerns regarding medicines are outlined above.  No orders of the defined types were placed in this encounter.  Medication changes: No  orders of the defined types were placed in this encounter.   Signed, Park Liter, MD, [facility] 07/26/2017 3:48 PM    [place]

## 2017-07-27 LAB — TSH: TSH: 2.89 u[IU]/mL (ref 0.35–4.50)

## 2017-10-06 MED FILL — ESCITALOPRAM 20 MG TABLET: 20 | 90 days supply | Qty: 90 | Fill #1

## 2017-10-06 MED FILL — LEVOTHYROXINE 150 MCG TAB: 150 | 90 days supply | Qty: 90 | Fill #1

## 2017-10-06 MED FILL — VALACYCLOVIR HCL 500 MG TAB: 500 | 30 days supply | Qty: 30 | Fill #1

## 2017-10-24 ENCOUNTER — Encounter: Payer: Self-pay | Admitting: Family

## 2017-10-27 ENCOUNTER — Ambulatory Visit: Payer: 59 | Admitting: Family Medicine

## 2017-10-27 ENCOUNTER — Other Ambulatory Visit (HOSPITAL_COMMUNITY)
Admission: RE | Admit: 2017-10-27 | Discharge: 2017-10-27 | Disposition: A | Discharge status: Home / Self Care | Payer: 59 | Source: Ambulatory Visit | Attending: Family Medicine | Admitting: Family Medicine

## 2017-10-27 ENCOUNTER — Encounter: Payer: Self-pay | Admitting: Family Medicine

## 2017-10-27 VITALS — BP 112/80 | HR 64 | Temp 98.5°F | Resp 16 | Ht 64.0 in | Wt 204.0 lb

## 2017-10-27 DIAGNOSIS — N898 Other specified noninflammatory disorders of vagina: Secondary | ICD-10-CM

## 2017-10-27 NOTE — Patient Instructions (Signed)
I will be in touch with your labs asap- if we find any infection we will treat it! Let me know if any worsening symptoms in the meantime

## 2017-10-27 NOTE — Progress Notes (Addendum)
[name] [address], [place], [place] [address] [phone]  Date:  10/27/2017   Name:  Cheyenne Hawkins   DOB:  August 11, 1975   MRN:  [id number]  PCP:  Debbrah Alar, NP    Chief Complaint: Vaginal Discharge (pale yellow discharge noticed saturday, STD testing)   History of Present Illness:  Cheyenne Hawkins is a 42 y.o. very pleasant female patient who presents with the following:  She has noted vaginal discharge for the last 4 days Not burning Pale yellow- no odor She did have intercourse with her husband last week and they have been apart for a couple of months  No itching No pain with urination No fever, vomiting  Patient Active Problem List   Diagnosis Date Noted  . Paroxysmal atrial fibrillation ([place]) 07/26/2017  . Abnormal EKG 06/16/2017  . Mitral valve prolapse 06/16/2017  . Insomnia 08/12/2015  . Tachycardia 08/12/2015  . Anemia 05/15/2015  . Finger injury 04/15/2014  . Skin irritation 04/15/2014  . Low back pain 06/01/2013  . Hypothyroidism 06/16/2012  . Anemia, iron deficiency 11/19/2011  . Skin picking habit 11/05/2011  . Tendonitis of elbow, right 09/03/2011  . Routine general medical examination at a health care facility 08/02/2011  . Galactorrhea 08/02/2011  . HSV-2 (herpes simplex virus 2) infection 07/09/2011  . Abdominal discomfort 03/17/2011  . Depression with anxiety 08/18/2010  . IBS (irritable bowel syndrome) 08/18/2010  . GERD (gastroesophageal reflux disease) 08/18/2010  . MITRAL VALVE PROLAPSE 04/19/2008  . CONSTIPATION 04/19/2008    Past Medical History:  Diagnosis Date  . Anemia, iron deficiency 11/19/2011  . Depression 2012  . Fatty liver 03/21/2011  . GERD (gastroesophageal reflux disease)   . Grave's disease 2002   s/p thyroidectomy  . Heart murmur   . History of chicken pox   . HSV-2 (herpes simplex virus 2) infection     Past Surgical History:   Procedure Laterality Date  . CESAREAN SECTION  2001  . CESAREAN SECTION  2003  . CESAREAN SECTION  2007  . THYROIDECTOMY  2003   for graves disease  . TUBAL LIGATION      Social History   Tobacco Use  . Smoking status: Never Smoker  . Smokeless tobacco: Never Used  Substance Use Topics  . Alcohol use: Yes    Alcohol/week: 10.0 standard drinks    Types: 10 Glasses of wine per week  . Drug use: No    Family History  Problem Relation Age of Onset  . Alcohol abuse Mother   . Depression Mother   . Bipolar disorder Mother   . Alcohol abuse Father   . Diabetes Father   . Hypertension Father   . Cancer Maternal Grandmother        colon  . Depression Maternal Grandmother   . Diabetes Maternal Grandmother   . Hypertension Maternal Grandmother   . Cancer Paternal Grandmother        breast  . Diabetes Paternal Grandmother   . Hypertension Paternal Grandmother     No Known Allergies  Medication list has been reviewed and updated.  Current Outpatient Medications on File Prior to Visit  Medication Sig Dispense Refill  . escitalopram (LEXAPRO) 20 MG tablet TAKE 1 TABLET (20 MG TOTAL) BY MOUTH DAILY. 90 tablet 1  . levothyroxine (SYNTHROID, LEVOTHROID) 150 MCG tablet Take 1 tablet (150 mcg total) by mouth daily. 90 tablet 1  . Multiple Vitamins-Minerals (  MULTIVITAMIN WITH MINERALS) tablet Take 1 tablet by mouth daily.    . ranitidine (ZANTAC) 150 MG capsule Take 1 capsule (150 mg total) by mouth 2 (two) times daily. 180 capsule 1  . valACYclovir (VALTREX) 500 MG tablet Take 1 tablet (500 mg total) by mouth daily as needed. 30 tablet 2   No current facility-administered medications on file prior to visit.     Review of Systems:  As per HPI- otherwise negative. No rash No vomiting No back or belly pain    Physical Examination: Vitals:   10/27/17 1715  BP: 112/80  Pulse: 64  Resp: 16  Temp: 98.5 F (36.9 C)  SpO2: 98%   Vitals:   10/27/17 1715  Weight: 204 lb  (92.5 kg)  Height: 5\' 4"  (1.626 m)   Body mass index is 35.02 kg/m. Ideal Body Weight: Weight in (lb) to have BMI = 25: 145.3  GEN: WDWN, NAD, Non-toxic, A & O x 3, overweight, looks well  HEENT: Atraumatic, Normocephalic. Neck supple. No masses, No LAD.  Ears and Nose: No external deformity. CV: RRR, No M/G/R. No JVD. No thrill. No extra heart sounds. PULM: CTA B, no wheezes, crackles, rhonchi. No retractions. No resp. distress. No accessory muscle use. ABD: S, NT, ND, +BS. No rebound. No HSM. EXTR: No c/c/e NEURO Normal gait.  PSYCH: Normally interactive. Conversant. Not depressed or anxious appearing.  Calm demeanor.  Pelvic: normal, no vaginal lesions, some thin non- specific discharge. Uterus normal, no CMT, no adnexal tendereness or masses  Assessment and Plan: Vaginal discharge - Plan: Cervicovaginal ancillary only  Vaginal discharge, concern of STI Swab pending as above Pt declines BW for now, she will have this done if any positive results on her swab  Will plan further follow- up pending labs.   Signed Lamar Blinks, MD  Received her labs 10/21- Message to pt Hi- it looks like the discussion of your labs got cut off somehow!  Your swab shows bacterial vaginosis. Not an STD- this is a bacterial imbalance.  If you are still having symptoms we can treat with an oral antibiotic, but if your symptoms are cleared up we don't have to treat.  Please reply to me if you are still having any symptoms   Results for orders placed or performed in visit on 10/27/17  Cervicovaginal ancillary only  Result Value Ref Range   Bacterial vaginitis **POSITIVE for Gardnerella vaginalis** (A)    Candida vaginitis Negative for Candida species    Chlamydia Negative    Neisseria gonorrhea Negative    Trichomonas Negative    Message to pt

## 2017-10-31 ENCOUNTER — Encounter: Payer: Self-pay | Admitting: Family Medicine

## 2017-10-31 DIAGNOSIS — N760 Acute vaginitis: Principal | ICD-10-CM

## 2017-10-31 DIAGNOSIS — B9689 Other specified bacterial agents as the cause of diseases classified elsewhere: Secondary | ICD-10-CM

## 2017-10-31 LAB — CERVICOVAGINAL ANCILLARY ONLY
Bacterial vaginitis: POSITIVE — AB
Candida vaginitis: NEGATIVE
Chlamydia: NEGATIVE
NEISSERIA GONORRHEA: NEGATIVE
Trichomonas: NEGATIVE

## 2017-10-31 MED ORDER — METRONIDAZOLE 500 MG PO TABS: 500.0000 mg | ORAL_TABLET | Freq: Two times a day (BID) | ORAL | 0 refills | Status: DC

## 2017-11-01 MED FILL — metroNIDAZOLE 500 MG TABS: 500 | 7 days supply | Qty: 14 | Fill #0

## 2017-11-30 ENCOUNTER — Ambulatory Visit: Payer: 59 | Admitting: Family

## 2017-12-05 ENCOUNTER — Ambulatory Visit: Payer: 59 | Admitting: Family

## 2017-12-05 ENCOUNTER — Encounter: Payer: Self-pay | Admitting: Family

## 2017-12-05 VITALS — BP 126/70 | HR 86 | Temp 98.0°F | Ht 64.0 in | Wt 203.4 lb

## 2017-12-05 DIAGNOSIS — E039 Hypothyroidism, unspecified: Secondary | ICD-10-CM | POA: Diagnosis not present

## 2017-12-05 DIAGNOSIS — K219 Gastro-esophageal reflux disease without esophagitis: Secondary | ICD-10-CM

## 2017-12-05 DIAGNOSIS — Z0000 Encounter for general adult medical examination without abnormal findings: Secondary | ICD-10-CM

## 2017-12-05 DIAGNOSIS — F329 Major depressive disorder, single episode, unspecified: Secondary | ICD-10-CM | POA: Diagnosis not present

## 2017-12-05 DIAGNOSIS — B009 Herpesviral infection, unspecified: Secondary | ICD-10-CM

## 2017-12-05 DIAGNOSIS — F32A Depression, unspecified: Secondary | ICD-10-CM

## 2017-12-05 NOTE — Patient Instructions (Signed)
Please complete lab work prior to leaving.   

## 2017-12-05 NOTE — Progress Notes (Signed)
Subjective:    Patient ID: Cheyenne Hawkins, female    DOB: September 16, 1975, 42 y.o.   MRN: [id number]  HPI  Patient presents today for follow up.  Hypothyroid- maintained on synthroid 173mcg.  Lab Results  Component Value Date   TSH 2.89 07/26/2017   GERD- maintained on zantac. Uses PRN. Reports that she has occasional symptoms.   Depression- maintained on lexapro.  Reports mood is good. Denies anxiety.    HSV2- maintained on prn valtrex.  Stable no recent outbreaks.      Review of Systems See HPI  Past Medical History:  Diagnosis Date  . Anemia, iron deficiency 11/19/2011  . Depression 2012  . Fatty liver 03/21/2011  . GERD (gastroesophageal reflux disease)   . Grave's disease 2002   s/p thyroidectomy  . Heart murmur   . History of chicken pox   . HSV-2 (herpes simplex virus 2) infection      Social History   Socioeconomic History  . Marital status: Married    Spouse name: Not on file  . Number of children: 3  . Years of education: Not on file  . Highest education level: Not on file  Occupational History    Employer: [employer]  . Financial resource strain: Not on file  . Food insecurity:    Worry: Not on file    Inability: Not on file  . Transportation needs:    Medical: Not on file    Non-medical: Not on file  Tobacco Use  . Smoking status: Never Smoker  . Smokeless tobacco: Never Used  Substance and Sexual Activity  . Alcohol use: Yes    Alcohol/week: 10.0 standard drinks    Types: 10 Glasses of wine per week  . Drug use: No  . Sexual activity: Yes    Birth control/protection: Surgical    Comment: btl  Lifestyle  . Physical activity:    Days per week: Not on file    Minutes per session: Not on file  . Stress: Not on file  Relationships  . Social connections:    Talks on phone: Not on file    Gets together: Not on file    Attends religious service: Not on file    Active member of club or organization: Not on file    Attends  meetings of clubs or organizations: Not on file    Relationship status: Not on file  . Intimate partner violence:    Fear of current or ex partner: Not on file    Emotionally abused: Not on file    Physically abused: Not on file    Forced sexual activity: Not on file  Other Topics Concern  . Not on file  Social History Narrative   Regular exercise:  No   Caffeine Use: 2 drinks weekly          Past Surgical History:  Procedure Laterality Date  . CESAREAN SECTION  2001  . CESAREAN SECTION  2003  . CESAREAN SECTION  2007  . THYROIDECTOMY  2003   for graves disease  . TUBAL LIGATION      Family History  Problem Relation Age of Onset  . Alcohol abuse Mother   . Depression Mother   . Bipolar disorder Mother   . Alcohol abuse Father   . Diabetes Father   . Hypertension Father   . Cancer Maternal Grandmother        colon  . Depression Maternal Grandmother   .  Diabetes Maternal Grandmother   . Hypertension Maternal Grandmother   . Cancer Paternal Grandmother        breast  . Diabetes Paternal Grandmother   . Hypertension Paternal Grandmother     No Known Allergies  Current Outpatient Medications on File Prior to Visit  Medication Sig Dispense Refill  . escitalopram (LEXAPRO) 20 MG tablet TAKE 1 TABLET (20 MG TOTAL) BY MOUTH DAILY. 90 tablet 1  . levothyroxine (SYNTHROID, LEVOTHROID) 150 MCG tablet Take 1 tablet (150 mcg total) by mouth daily. 90 tablet 1  . Multiple Vitamins-Minerals (MULTIVITAMIN WITH MINERALS) tablet Take 1 tablet by mouth daily.    . ranitidine (ZANTAC) 150 MG capsule Take 1 capsule (150 mg total) by mouth 2 (two) times daily. 180 capsule 1  . valACYclovir (VALTREX) 500 MG tablet Take 1 tablet (500 mg total) by mouth daily as needed. 30 tablet 2   No current facility-administered medications on file prior to visit.     BP 126/70 (BP Location: Left Arm, Patient Position: Sitting, Cuff Size: Normal)   Pulse 86   Temp 98 F (36.7 C) (Oral)   Ht 5'  4" (1.626 m)   Wt 203 lb 6.4 oz (92.3 kg)   LMP 11/30/2017 (Approximate)   SpO2 97%   BMI 34.91 kg/m       Objective:   Physical Exam  Constitutional: She is oriented to person, place, and time. She appears well-developed and well-nourished.  Cardiovascular: Normal rate, regular rhythm and normal heart sounds.  No murmur heard. Pulmonary/Chest: Effort normal and breath sounds normal. No respiratory distress. She has no wheezes.  Musculoskeletal: She exhibits no edema.  Neurological: She is alert and oriented to person, place, and time.  Skin: Skin is warm and dry.  Psychiatric: She has a normal mood and affect. Her behavior is normal. Judgment and thought content normal.          Assessment & Plan:  Hypothyroid- clinically stable on synthroid, continue same, obtain follow up tsh.   GERD-stable on as needed Zantac.  Continue as needed.  Depression- stable on lexapro, continue same.  HSV2- stable, no recent outbreaks.

## 2017-12-06 ENCOUNTER — Ambulatory Visit (HOSPITAL_BASED_OUTPATIENT_CLINIC_OR_DEPARTMENT_OTHER): Payer: 59

## 2017-12-10 ENCOUNTER — Telehealth: Payer: Self-pay | Admitting: Family

## 2017-12-10 NOTE — Telephone Encounter (Signed)
See mychart.  

## 2017-12-23 ENCOUNTER — Encounter (HOSPITAL_BASED_OUTPATIENT_CLINIC_OR_DEPARTMENT_OTHER): Payer: Self-pay

## 2017-12-23 ENCOUNTER — Ambulatory Visit (HOSPITAL_BASED_OUTPATIENT_CLINIC_OR_DEPARTMENT_OTHER)
Admission: RE | Admit: 2017-12-23 | Discharge: 2017-12-23 | Disposition: A | Discharge status: Home / Self Care | Payer: 59 | Source: Ambulatory Visit | Attending: Family | Admitting: Family

## 2017-12-23 DIAGNOSIS — Z1231 Encounter for screening mammogram for malignant neoplasm of breast: Secondary | ICD-10-CM | POA: Diagnosis not present

## 2017-12-23 DIAGNOSIS — Z0000 Encounter for general adult medical examination without abnormal findings: Secondary | ICD-10-CM | POA: Insufficient documentation

## 2018-01-25 ENCOUNTER — Other Ambulatory Visit: Payer: Self-pay | Admitting: Family

## 2018-02-03 MED FILL — ESCITALOPRAM 20 MG TABLET: 20 | 90 days supply | Qty: 90 | Fill #0

## 2018-02-03 MED FILL — LEVOTHYROXINE 150 MCG TAB: 150 | 90 days supply | Qty: 90 | Fill #0

## 2018-05-11 MED FILL — LEVOTHYROXINE 150 MCG TAB: 150 | 90 days supply | Qty: 90 | Fill #1

## 2018-05-11 MED FILL — ESCITALOPRAM 20 MG TABLET: 20 | 90 days supply | Qty: 90 | Fill #1

## 2018-05-16 ENCOUNTER — Encounter: Payer: Self-pay | Admitting: Gastroenterology

## 2018-08-25 ENCOUNTER — Other Ambulatory Visit: Payer: Self-pay | Admitting: Family

## 2018-08-27 NOTE — Telephone Encounter (Signed)
Pt due for follow up. Please contact pt to schedule.

## 2018-08-28 MED FILL — ESCITALOPRAM 20 MG TABLET: 20 | 90 days supply | Qty: 90 | Fill #0

## 2018-08-28 MED FILL — LEVOTHYROXINE 150 MCG TAB: 150 | 90 days supply | Qty: 90 | Fill #0

## 2018-09-01 ENCOUNTER — Ambulatory Visit (INDEPENDENT_AMBULATORY_CARE_PROVIDER_SITE_OTHER): Payer: 59 | Admitting: Family

## 2018-09-01 ENCOUNTER — Other Ambulatory Visit: Payer: Self-pay

## 2018-09-01 ENCOUNTER — Encounter: Payer: Self-pay | Admitting: Family

## 2018-09-01 VITALS — BP 121/89 | HR 83 | Temp 97.2°F | Resp 16 | Ht 64.0 in | Wt 190.0 lb

## 2018-09-01 DIAGNOSIS — K219 Gastro-esophageal reflux disease without esophagitis: Secondary | ICD-10-CM | POA: Diagnosis not present

## 2018-09-01 DIAGNOSIS — M6788 Other specified disorders of synovium and tendon, other site: Secondary | ICD-10-CM | POA: Diagnosis not present

## 2018-09-01 DIAGNOSIS — B009 Herpesviral infection, unspecified: Secondary | ICD-10-CM | POA: Diagnosis not present

## 2018-09-01 DIAGNOSIS — F418 Other specified anxiety disorders: Secondary | ICD-10-CM | POA: Diagnosis not present

## 2018-09-01 DIAGNOSIS — Z0000 Encounter for general adult medical examination without abnormal findings: Secondary | ICD-10-CM | POA: Diagnosis not present

## 2018-09-01 DIAGNOSIS — E039 Hypothyroidism, unspecified: Secondary | ICD-10-CM | POA: Diagnosis not present

## 2018-09-01 MED ORDER — ESCITALOPRAM OXALATE 20 MG PO TABS: 20.0000 mg | ORAL_TABLET | Freq: Every day | ORAL | 0 refills | Status: DC

## 2018-09-01 MED ORDER — LEVOTHYROXINE SODIUM 150 MCG PO TABS: 150.0000 ug | ORAL_TABLET | Freq: Every day | ORAL | 0 refills | Status: DC

## 2018-09-01 MED ORDER — VALACYCLOVIR HCL 500 MG PO TABS: 500.0000 mg | ORAL_TABLET | Freq: Every day | ORAL | 2 refills | Status: DC | PRN

## 2018-09-01 MED FILL — VALACYCLOVIR HCL 500 MG TAB: 500 | 30 days supply | Qty: 30 | Fill #0

## 2018-09-01 NOTE — Patient Instructions (Signed)
Please complete lab work prior to leaving.  Keep up the great work with weight loss!

## 2018-09-01 NOTE — Progress Notes (Signed)
Subjective:    Patient ID: Cheyenne Hawkins, female    DOB: 19-Sep-1975, 43 y.o.   MRN: [id number]  HPI  Patient presents today for complete physical.  Immunizations: tdap 2015 Diet: fair Wt Readings from Last 3 Encounters:  09/01/18 190 lb (86.2 kg)  12/05/17 203 lb 6.4 oz (92.3 kg)  10/27/17 204 lb (92.5 kg)  Exercise: slowly starting to exercise Pap Smear: 05/30/17 Mammogram: 12/23/17 Vision:  due Dental: up to date  Hypothyroid- feels ok, though ran out of her medications a few days ago.  Lab Results  Component Value Date   TSH 2.89 07/26/2017   Depression- maintained on lexapro. Reports that she ran out of lexapro a few days ago. Mood has been good.   GERD- reports stable symptoms off of meds.   HSV2- continues valtrex prn. Usually catches breakouts early and begins valtrex prior to breakout.   Review of Systems  Constitutional: Negative for unexpected weight change.  HENT: Negative for hearing loss and rhinorrhea.   Eyes: Negative for visual disturbance.  Respiratory: Negative for cough.   Cardiovascular: Negative for leg swelling.  Gastrointestinal: Negative for constipation and diarrhea.  Genitourinary: Negative for dysuria, frequency and hematuria.  Musculoskeletal: Negative for arthralgias and joint swelling.  Skin: Negative for rash.  Neurological: Negative for headaches.  Hematological: Negative for adenopathy.  Psychiatric/Behavioral:       Reports depression is stable.  Worse before her period.  Reports some anxiety at times.     Past Medical History:  Diagnosis Date  . Anemia, iron deficiency 11/19/2011  . Depression 2012  . Fatty liver 03/21/2011  . GERD (gastroesophageal reflux disease)   . Grave's disease 2002   s/p thyroidectomy  . Heart murmur   . History of chicken pox   . HSV-2 (herpes simplex virus 2) infection      Social History   Socioeconomic History  . Marital status: Married    Spouse name: Not on file  . Number of children:  3  . Years of education: Not on file  . Highest education level: Not on file  Occupational History    Employer: [employer]  . Financial resource strain: Not on file  . Food insecurity    Worry: Not on file    Inability: Not on file  . Transportation needs    Medical: Not on file    Non-medical: Not on file  Tobacco Use  . Smoking status: Never Smoker  . Smokeless tobacco: Never Used  Substance and Sexual Activity  . Alcohol use: Yes    Alcohol/week: 10.0 standard drinks    Types: 10 Glasses of wine per week  . Drug use: No  . Sexual activity: Yes    Birth control/protection: Surgical    Comment: btl  Lifestyle  . Physical activity    Days per week: Not on file    Minutes per session: Not on file  . Stress: Not on file  Relationships  . Social Herbalist on phone: Not on file    Gets together: Not on file    Attends religious service: Not on file    Active member of club or organization: Not on file    Attends meetings of clubs or organizations: Not on file    Relationship status: Not on file  . Intimate partner violence    Fear of current or ex partner: Not on file    Emotionally abused: Not on file  Physically abused: Not on file    Forced sexual activity: Not on file  Other Topics Concern  . Not on file  Social History Narrative   Regular exercise:  No   Caffeine Use: 2 drinks weekly          Past Surgical History:  Procedure Laterality Date  . CESAREAN SECTION  2001  . CESAREAN SECTION  2003  . CESAREAN SECTION  2007  . THYROIDECTOMY  2003   for graves disease  . TUBAL LIGATION      Family History  Problem Relation Age of Onset  . Alcohol abuse Mother   . Depression Mother   . Bipolar disorder Mother   . Alcohol abuse Father   . Diabetes Father   . Hypertension Father   . Cancer Maternal Grandmother        colon  . Depression Maternal Grandmother   . Diabetes Maternal Grandmother   . Hypertension Maternal  Grandmother   . Cancer Paternal Grandmother        breast  . Diabetes Paternal Grandmother   . Hypertension Paternal Grandmother     No Known Allergies  Current Outpatient Medications on File Prior to Visit  Medication Sig Dispense Refill  . Multiple Vitamins-Minerals (MULTIVITAMIN WITH MINERALS) tablet Take 1 tablet by mouth daily.     No current facility-administered medications on file prior to visit.     BP 121/89 (BP Location: Right Arm, Patient Position: Sitting, Cuff Size: Small)   Pulse 83   Temp (!) 97.2 F (36.2 C) (Temporal)   Resp 16   Ht 5\' 4"  (1.626 m)   Wt 190 lb (86.2 kg)   SpO2 100%   BMI 32.61 kg/m       Objective:   Physical Exam  Physical Exam  Constitutional: She is oriented to person, place, and time. She appears well-developed and well-nourished. No distress.  HENT:  Head: Normocephalic and atraumatic.  Right Ear: Tympanic membrane and ear canal normal.  Left Ear: Tympanic membrane and ear canal normal.  Mouth/Throat: Oropharynx is clear and moist.  Eyes: Pupils are equal, round, and reactive to light. No scleral icterus.  Neck: Normal range of motion. No thyromegaly present.  Cardiovascular: Normal rate and regular rhythm.   No murmur heard. Pulmonary/Chest: Effort normal and breath sounds normal. No respiratory distress. He has no wheezes. She has no rales. She exhibits no tenderness.  Abdominal: Soft. Bowel sounds are normal. She exhibits no distension and no mass. There is no tenderness. There is no rebound and no guarding.  Musculoskeletal: She exhibits no edema. achilles tendinosis noted on left Lymphadenopathy:    She has no cervical adenopathy.  Neurological: She is alert and oriented to person, place, and time. She has normal patellar reflexes. She exhibits normal muscle tone. Coordination normal.  Skin: Skin is warm and dry. few mosquito bites noted on arms and legs Psychiatric: She has a normal mood and affect. Her behavior is  normal. Judgment and thought content normal.      Assessment & Plan:   Preventative care- Immunizations reviewed and up to date. Recommended flu shot this fall. Commended pt on her weight loss. Pap, mammo up to date. Obtain routine lab work.   Depression/anxiety- fair control on lexapro. We discussed adding some daily breathing exercises/meditation for weight loss.   Hypothyroid- obtain follow up tsh. Continue synthroid.  Achilles tendinosis- discussed possibility of referral to sports medicine- she is not interested at this time.  Monitor.  HSV2- stable with prn valtrex.  GERD- stable off of medications.      Assessment & Plan:

## 2018-09-02 LAB — CBC WITH DIFFERENTIAL/PLATELET
Absolute Monocytes: 608 cells/uL (ref 200–950)
Basophils Absolute: 71 cells/uL (ref 0–200)
Basophils Relative: 0.9 %
Eosinophils Absolute: 71 cells/uL (ref 15–500)
Eosinophils Relative: 0.9 %
HCT: 35.1 % (ref 35.0–45.0)
Hemoglobin: 11.2 g/dL — ABNORMAL LOW (ref 11.7–15.5)
Lymphs Abs: 2157 cells/uL (ref 850–3900)
MCH: 26.5 pg — ABNORMAL LOW (ref 27.0–33.0)
MCHC: 31.9 g/dL — ABNORMAL LOW (ref 32.0–36.0)
MCV: 83.2 fL (ref 80.0–100.0)
MPV: 10.1 fL (ref 7.5–12.5)
Monocytes Relative: 7.7 %
Neutro Abs: 4993 cells/uL (ref 1500–7800)
Neutrophils Relative %: 63.2 %
Platelets: 365 10*3/uL (ref 140–400)
RBC: 4.22 10*6/uL (ref 3.80–5.10)
RDW: 14.3 % (ref 11.0–15.0)
Total Lymphocyte: 27.3 %
WBC: 7.9 10*3/uL (ref 3.8–10.8)

## 2018-09-02 LAB — LIPID PANEL
Cholesterol: 175 mg/dL (ref <200)
HDL: 75 mg/dL (ref >=50)
LDL Cholesterol (Calc): 85 mg/dL (calc)
Non-HDL Cholesterol (Calc): 100 mg/dL (calc) (ref <130)
Total CHOL/HDL Ratio: 2.3 (calc) (ref <5.0)
Triglycerides: 67 mg/dL (ref <150)

## 2018-09-02 LAB — HEPATIC FUNCTION PANEL
AG Ratio: 1.3 (calc) (ref 1.0–2.5)
ALT: 14 U/L (ref 6–29)
AST: 18 U/L (ref 10–30)
Albumin: 4.2 g/dL (ref 3.6–5.1)
Alkaline phosphatase (APISO): 74 U/L (ref 31–125)
Bilirubin, Direct: 0.1 mg/dL (ref 0.0–0.2)
Globulin: 3.3 g/dL (calc) (ref 1.9–3.7)
Indirect Bilirubin: 0.3 mg/dL (calc) (ref 0.2–1.2)
Total Bilirubin: 0.4 mg/dL (ref 0.2–1.2)
Total Protein: 7.5 g/dL (ref 6.1–8.1)

## 2018-09-02 LAB — BASIC METABOLIC PANEL
BUN: 12 mg/dL (ref 7–25)
CO2: 25 mmol/L (ref 20–32)
Calcium: 9 mg/dL (ref 8.6–10.2)
Chloride: 101 mmol/L (ref 98–110)
Creat: 0.61 mg/dL (ref 0.50–1.10)
Glucose, Bld: 80 mg/dL (ref 65–99)
Potassium: 4.3 mmol/L (ref 3.5–5.3)
Sodium: 134 mmol/L — ABNORMAL LOW (ref 135–146)

## 2018-09-02 LAB — TSH: TSH: 6.91 mIU/L — ABNORMAL HIGH

## 2018-09-03 ENCOUNTER — Telehealth: Payer: Self-pay | Admitting: Family

## 2018-09-03 DIAGNOSIS — E039 Hypothyroidism, unspecified: Secondary | ICD-10-CM

## 2018-09-03 DIAGNOSIS — D649 Anemia, unspecified: Secondary | ICD-10-CM

## 2018-09-03 MED ORDER — LEVOTHYROXINE SODIUM 175 MCG PO TABS: 175.0000 ug | ORAL_TABLET | Freq: Every day | ORAL | 5 refills | Status: DC

## 2018-09-03 NOTE — Telephone Encounter (Signed)
See mychart.  

## 2018-09-04 MED FILL — LEVOTHYROXINE 175 MCG TAB: 175 | 30 days supply | Qty: 30 | Fill #0

## 2018-10-26 ENCOUNTER — Telehealth: Payer: Self-pay | Admitting: Family

## 2018-10-26 ENCOUNTER — Ambulatory Visit (INDEPENDENT_AMBULATORY_CARE_PROVIDER_SITE_OTHER): Payer: 59 | Admitting: Family

## 2018-10-26 ENCOUNTER — Other Ambulatory Visit: Payer: Self-pay

## 2018-10-26 ENCOUNTER — Encounter: Payer: Self-pay | Admitting: Family

## 2018-10-26 ENCOUNTER — Ambulatory Visit (INDEPENDENT_AMBULATORY_CARE_PROVIDER_SITE_OTHER): Payer: 59 | Admitting: *Deleted

## 2018-10-26 VITALS — BP 137/93 | HR 75

## 2018-10-26 DIAGNOSIS — R0789 Other chest pain: Secondary | ICD-10-CM

## 2018-10-26 DIAGNOSIS — I10 Essential (primary) hypertension: Secondary | ICD-10-CM | POA: Diagnosis not present

## 2018-10-26 MED ORDER — AMLODIPINE BESYLATE 2.5 MG PO TABS: 2.5000 mg | ORAL_TABLET | Freq: Every day | ORAL | 3 refills | Status: DC

## 2018-10-26 MED FILL — AMLODIPINE 2.5 MG TABLET: 2.5 | 30 days supply | Qty: 30 | Fill #0

## 2018-10-26 NOTE — Telephone Encounter (Signed)
Called patient , she has been scheduled to be here at 11:15 for an 11:30 nv.

## 2018-10-26 NOTE — Progress Notes (Signed)
Patient here for ekg per Melissa.    Melissa doing virtual visit.

## 2018-10-26 NOTE — Telephone Encounter (Signed)
Cheyenne Hawkins, please see if you can bring pt in today for EKG and vitals, nurse visit. I will review and give further recommendations. Keep Monday appointment with me.

## 2018-10-26 NOTE — Telephone Encounter (Signed)
Spoke with pt to try and schedule OV with PCP. PCP not in office today and Schedule on Friday is at max. Pt states she has a work meeting Friday from 1 to 4pm. Pt only wants to see PCP. Scheduled Virtual Visit for Monday at 8:20am. Pt denies chest pain and SOB but state she has some intermittent "twinges" in her chest. Reports that she is under increased stress and notes that BP has been elevated per mychart note x 1 week. States she has seen cardiology in past but no further treatment was needed at that time. She denies pain in neck/jaw or arm and no pain radiating through to her back. I advised pt if "twinges" worsen or develops chest pain, neck/jaw pain or SOB to be evaluated in ER. Pt voices understanding. Please advise if you have other recommendations?

## 2018-10-26 NOTE — Telephone Encounter (Signed)
Please contact pt and let her know that I reviewed her EKG. It appears unchanged compared to her previous EKG.  I have sent amlodipine to her pharmacy. Lets bring her back in 2 weeks for a face to face with me and we can recheck BP.  I have also placed a referral for her to follow back up with cardiology.  In the meantime- if she develops recurrent chest pain she should go to the ED.

## 2018-10-26 NOTE — Progress Notes (Signed)
Virtual Visit via Video Note  I connected with Cheyenne Hawkins on 10/26/18 at 12:15 PM EDT by a video enabled telemedicine application and verified that I am speaking with the correct person using two identifiers.  Location: Patient: office Provider: home   I discussed the limitations of evaluation and management by telemedicine and the availability of in person appointments. The patient expressed understanding and agreed to proceed.  History of Present Illness:  Patient is a 43 yr old female who presents today with chief complaint of elevated blood pressure. Reports that she noted increased BP readings at home and this really concerned her. Home readings have been 140's of 90-95. Upon further questioning, she also notes intermittent "twinges" of left sided chest pain.  Pain is non-radiating. Has no SOB. Pain is not worsened by exertion. She is not having this pain at the time of today's visit.    Of note, she had a visit with cardiology 07/26/17. Cardiology reviewed her abnormal EKG.  A 2-D echo was performed and reviewed by Dr. Marlin Canary and he did note well preserved LV function so he did not feel that there was concern for anterior septal MI.  He recommended to the patient that she carry her abnormal EKG with her as a baseline for future comparisons.  Past Medical History:  Diagnosis Date  . Anemia, iron deficiency 11/19/2011  . Depression 2012  . Fatty liver 03/21/2011  . GERD (gastroesophageal reflux disease)   . Grave's disease 2002   s/p thyroidectomy  . Heart murmur   . History of chicken pox   . HSV-2 (herpes simplex virus 2) infection      Social History   Socioeconomic History  . Marital status: Married    Spouse name: Not on file  . Number of children: 3  . Years of education: Not on file  . Highest education level: Not on file  Occupational History    Employer: [employer]  . Financial resource strain: Not on file  . Food insecurity    Worry: Not  on file    Inability: Not on file  . Transportation needs    Medical: Not on file    Non-medical: Not on file  Tobacco Use  . Smoking status: Never Smoker  . Smokeless tobacco: Never Used  Substance and Sexual Activity  . Alcohol use: Yes    Alcohol/week: 10.0 standard drinks    Types: 10 Glasses of wine per week  . Drug use: No  . Sexual activity: Yes    Birth control/protection: Surgical    Comment: btl  Lifestyle  . Physical activity    Days per week: Not on file    Minutes per session: Not on file  . Stress: Not on file  Relationships  . Social Herbalist on phone: Not on file    Gets together: Not on file    Attends religious service: Not on file    Active member of club or organization: Not on file    Attends meetings of clubs or organizations: Not on file    Relationship status: Not on file  . Intimate partner violence    Fear of current or ex partner: Not on file    Emotionally abused: Not on file    Physically abused: Not on file    Forced sexual activity: Not on file  Other Topics Concern  . Not on file  Social History Narrative   Regular exercise:  No  Caffeine Use: 2 drinks weekly          Past Surgical History:  Procedure Laterality Date  . CESAREAN SECTION  2001  . CESAREAN SECTION  2003  . CESAREAN SECTION  2007  . THYROIDECTOMY  2003   for graves disease  . TUBAL LIGATION      Family History  Problem Relation Age of Onset  . Alcohol abuse Mother   . Depression Mother   . Bipolar disorder Mother   . Alcohol abuse Father   . Diabetes Father   . Hypertension Father   . Cancer Maternal Grandmother        colon  . Depression Maternal Grandmother   . Diabetes Maternal Grandmother   . Hypertension Maternal Grandmother   . Cancer Paternal Grandmother        breast  . Diabetes Paternal Grandmother   . Hypertension Paternal Grandmother     No Known Allergies  Current Outpatient Medications on File Prior to Visit  Medication  Sig Dispense Refill  . escitalopram (LEXAPRO) 20 MG tablet Take 1 tablet (20 mg total) by mouth daily. 90 tablet 0  . levothyroxine (SYNTHROID) 175 MCG tablet Take 1 tablet (175 mcg total) by mouth daily before breakfast. 30 tablet 5  . Multiple Vitamins-Minerals (MULTIVITAMIN WITH MINERALS) tablet Take 1 tablet by mouth daily.    . valACYclovir (VALTREX) 500 MG tablet Take 1 tablet (500 mg total) by mouth daily as needed. 30 tablet 2   No current facility-administered medications on file prior to visit.     BP (!) 137/93   Pulse 75      Observations/Objective:   Gen: Awake, alert, no acute distress Resp: Breathing is even and non-labored Psych: calm/pleasant demeanor Neuro: Alert and Oriented x 3, + facial symmetry, speech is clear.   Assessment and Plan:  HTN- will add amlodipine 2.5mg  once daily.   Atypical chest pain- I reviewed today's ekg and compared to previous EKG's on file. EKG notes NSR and appears unchanged compared to prior EKG's on file. No acute changes.  Will arrange follow up with cardiology.  In the meantime, pt is advised to go to the ER if she develops severe/recurrent chest pain. Pt verbalizes understanding.   Follow Up Instructions:    I discussed the assessment and treatment plan with the patient. The patient was provided an opportunity to ask questions and all were answered. The patient agreed with the plan and demonstrated an understanding of the instructions.   The patient was advised to call back or seek an in-person evaluation if the symptoms worsen or if the condition fails to improve as anticipated.  Nance Pear, NP

## 2018-10-26 NOTE — Telephone Encounter (Signed)
Patient advised of results and provider's advise, she was scheduled for follow up 11-10-2018.

## 2018-10-30 ENCOUNTER — Telehealth: Payer: 59 | Admitting: Family

## 2018-11-09 ENCOUNTER — Other Ambulatory Visit: Payer: Self-pay

## 2018-11-10 ENCOUNTER — Other Ambulatory Visit: Payer: Self-pay

## 2018-11-10 ENCOUNTER — Ambulatory Visit: Payer: 59 | Admitting: Family

## 2018-11-10 ENCOUNTER — Encounter: Payer: Self-pay | Admitting: Family

## 2018-11-10 VITALS — BP 113/75 | HR 92 | Temp 96.7°F | Resp 16 | Wt 198.0 lb

## 2018-11-10 DIAGNOSIS — F418 Other specified anxiety disorders: Secondary | ICD-10-CM

## 2018-11-10 DIAGNOSIS — E039 Hypothyroidism, unspecified: Secondary | ICD-10-CM

## 2018-11-10 DIAGNOSIS — I10 Essential (primary) hypertension: Secondary | ICD-10-CM | POA: Diagnosis not present

## 2018-11-10 LAB — TSH: TSH: 0.49 u[IU]/mL (ref 0.35–4.50)

## 2018-11-10 NOTE — Progress Notes (Signed)
Subjective:    Patient ID: Cheyenne Hawkins, female    DOB: 11/21/1975, 43 y.o.   MRN: [id number]  HPI  Patient is a 43 yr old female who presents today for follow up.  HTN- last visit we added amlodipine 2.5mg  once daily.  Reports that she has had some high diastolic.  Reports that her HR runs 90's to 110's.  BP Readings from Last 3 Encounters:  11/10/18 113/75  10/26/18 (!) 137/93  10/26/18 (!) 137/93   Hypothyroid- maintained on synthroid 175 mcg.  Lab Results  Component Value Date   TSH 6.91 (H) 09/01/2018   Depression- maintained on lexapro 20mg . Reports that she is taking tylenol PM's to help her sleep.   Wt Readings from Last 3 Encounters:  11/10/18 198 lb (89.8 kg)  09/01/18 190 lb (86.2 kg)  12/05/17 203 lb 6.4 oz (92.3 kg)      Review of Systems See HPI  Past Medical History:  Diagnosis Date  . Anemia, iron deficiency 11/19/2011  . Depression 2012  . Fatty liver 03/21/2011  . GERD (gastroesophageal reflux disease)   . Grave's disease 2002   s/p thyroidectomy  . Heart murmur   . History of chicken pox   . HSV-2 (herpes simplex virus 2) infection      Social History   Socioeconomic History  . Marital status: Married    Spouse name: Not on file  . Number of children: 3  . Years of education: Not on file  . Highest education level: Not on file  Occupational History    Employer: [employer]  . Financial resource strain: Not on file  . Food insecurity    Worry: Not on file    Inability: Not on file  . Transportation needs    Medical: Not on file    Non-medical: Not on file  Tobacco Use  . Smoking status: Never Smoker  . Smokeless tobacco: Never Used  Substance and Sexual Activity  . Alcohol use: Yes    Alcohol/week: 10.0 standard drinks    Types: 10 Glasses of wine per week  . Drug use: No  . Sexual activity: Yes    Birth control/protection: Surgical    Comment: btl  Lifestyle  . Physical activity    Days per week: Not on  file    Minutes per session: Not on file  . Stress: Not on file  Relationships  . Social Herbalist on phone: Not on file    Gets together: Not on file    Attends religious service: Not on file    Active member of club or organization: Not on file    Attends meetings of clubs or organizations: Not on file    Relationship status: Not on file  . Intimate partner violence    Fear of current or ex partner: Not on file    Emotionally abused: Not on file    Physically abused: Not on file    Forced sexual activity: Not on file  Other Topics Concern  . Not on file  Social History Narrative   Regular exercise:  No   Caffeine Use: 2 drinks weekly          Past Surgical History:  Procedure Laterality Date  . CESAREAN SECTION  2001  . CESAREAN SECTION  2003  . CESAREAN SECTION  2007  . THYROIDECTOMY  2003   for graves disease  . TUBAL LIGATION  Family History  Problem Relation Age of Onset  . Alcohol abuse Mother   . Depression Mother   . Bipolar disorder Mother   . Alcohol abuse Father   . Diabetes Father   . Hypertension Father   . Cancer Maternal Grandmother        colon  . Depression Maternal Grandmother   . Diabetes Maternal Grandmother   . Hypertension Maternal Grandmother   . Cancer Paternal Grandmother        breast  . Diabetes Paternal Grandmother   . Hypertension Paternal Grandmother     No Known Allergies  Current Outpatient Medications on File Prior to Visit  Medication Sig Dispense Refill  . amLODipine (NORVASC) 2.5 MG tablet Take 1 tablet (2.5 mg total) by mouth daily. 30 tablet 3  . escitalopram (LEXAPRO) 20 MG tablet Take 1 tablet (20 mg total) by mouth daily. 90 tablet 0  . levothyroxine (SYNTHROID) 175 MCG tablet Take 1 tablet (175 mcg total) by mouth daily before breakfast. 30 tablet 5  . Multiple Vitamins-Minerals (MULTIVITAMIN WITH MINERALS) tablet Take 1 tablet by mouth daily.    . valACYclovir (VALTREX) 500 MG tablet Take 1 tablet  (500 mg total) by mouth daily as needed. 30 tablet 2   No current facility-administered medications on file prior to visit.     BP 113/75 (BP Location: Right Arm, Patient Position: Sitting, Cuff Size: Large)   Pulse 92   Temp (!) 96.7 F (35.9 C) (Temporal)   Resp 16   Wt 198 lb (89.8 kg)   SpO2 100%   BMI 33.99 kg/m       Objective:   Physical Exam Constitutional:      Appearance: She is well-developed.  Neck:     Musculoskeletal: Neck supple.     Thyroid: No thyromegaly.  Cardiovascular:     Rate and Rhythm: Normal rate and regular rhythm.     Heart sounds: Normal heart sounds. No murmur.  Pulmonary:     Effort: Pulmonary effort is normal. No respiratory distress.     Breath sounds: Normal breath sounds. No wheezing.  Skin:    General: Skin is warm and dry.  Neurological:     Mental Status: She is alert and oriented to person, place, and time.  Psychiatric:        Behavior: Behavior normal.        Thought Content: Thought content normal.        Judgment: Judgment normal.           Assessment & Plan:  HTN- BP looks great here today. Continue current dose of amlodipine. Follow up in 3 months.  Hypothyroid- last TSH was elevated. Obtain follow up TSH.  Depression- stressed recently due to decision to separate from her husband.  Hopes that things will get easier once the separation is complete. Continue lexapro.

## 2018-11-10 NOTE — Patient Instructions (Signed)
Please complete lab work prior to leaving.   

## 2018-11-13 ENCOUNTER — Encounter: Payer: Self-pay | Admitting: Cardiology

## 2018-11-13 ENCOUNTER — Other Ambulatory Visit: Payer: Self-pay

## 2018-11-13 ENCOUNTER — Telehealth (INDEPENDENT_AMBULATORY_CARE_PROVIDER_SITE_OTHER): Payer: 59 | Admitting: Cardiology

## 2018-11-13 VITALS — BP 145/93 | Ht 64.0 in | Wt 195.0 lb

## 2018-11-13 DIAGNOSIS — R0789 Other chest pain: Secondary | ICD-10-CM

## 2018-11-13 DIAGNOSIS — I480 Paroxysmal atrial fibrillation: Secondary | ICD-10-CM | POA: Diagnosis not present

## 2018-11-13 DIAGNOSIS — I341 Nonrheumatic mitral (valve) prolapse: Secondary | ICD-10-CM

## 2018-11-13 MED ORDER — ASPIRIN EC 81 MG PO TBEC: 81.0000 mg | DELAYED_RELEASE_TABLET | Freq: Every day | ORAL | 3 refills | Status: DC

## 2018-11-13 NOTE — Patient Instructions (Signed)
Medication Instructions:  Your physician has recommended you make the following change in your medication:  START: Aspirin 81 mg daily   *If you need a refill on your cardiac medications before your next appointment, please call your pharmacy*  Lab Work: None.  If you have labs (blood work) drawn today and your tests are completely normal, you will receive your results only by:  [place] (if you have MyChart) OR  A paper copy in the mail If you have any lab test that is abnormal or we need to change your treatment, we will call you to review the results.  Testing/Procedures: Non-Cardiac CT scanning, (CAT scanning), is a noninvasive, special x-ray that produces cross-sectional images of the body using x-rays and a computer. CT scans help physicians diagnose and treat medical conditions. For some CT exams, a contrast material is used to enhance visibility in the area of the body being studied. CT scans provide greater clarity and reveal more details than regular x-ray exams.    Follow-Up: At [facility], you and your health needs are our priority.  As part of our continuing mission to provide you with exceptional heart care, we have created designated Provider Care Teams.  These Care Teams include your primary Cardiologist (physician) and Advanced Practice Providers (APPs -  Physician Assistants and Nurse Practitioners) who all work together to provide you with the care you need, when you need it.  Your next appointment:   3 months  The format for your next appointment:   In Person  Provider:   You may see Dr. Agustin Cree. or the following Advanced Practice Provider on your designated Care Team:    Laurann Montana, FNP   Other Instructions   Aspirin and Your Heart  Aspirin is a medicine that prevents the cells in the blood that are used for clotting, called platelets, from sticking together. Aspirin can be used to help reduce the risk of blood clots, heart attacks, and  other heart-related problems. Can I take aspirin? Your health care provider will help you determine whether it is safe and beneficial for you to take aspirin daily. Taking aspirin daily may be helpful if you:  Have had a heart attack or chest pain.  Are at risk for a heart attack.  Have undergone open-heart surgery, such as coronary artery bypass surgery (CABG).  Have had coronary angioplasty or a stent.  Have had certain types of stroke or transient ischemic attack (TIA).  Have peripheral artery disease (PAD).  Have chronic heart rhythm problems such as atrial fibrillation and cannot take an anticoagulant.  Have valve disease or have had surgery on a valve. What are the risks? Daily use of aspirin can cause side effects. Some of these include:  Bleeding. Bleeding problems can be minor or serious. An example of a minor problem is a cut that does not stop bleeding. An example of a more serious problem is stomach bleeding or, rarely, bleeding into the brain. Your risk of bleeding is increased if you are also taking non-steroidal anti-inflammatory drugs (NSAIDs).  Increased bruising.  Upset stomach.  An allergic reaction. People who have nasal polyps have an increased risk of developing an aspirin allergy. General guidelines  Take aspirin only as told by your health care provider. Make sure that you understand how much you should take and what form you should take. The two forms of aspirin are: ? Non-enteric-coated.This type of aspirin does not have a coating and is absorbed quickly. This type of aspirin  also comes in a chewable form. ? Enteric-coated. This type of aspirin has a coating that releases the medicine very slowly. Enteric-coated aspirin might cause less stomach upset than non-enteric-coated aspirin. This type of aspirin should not be chewed or crushed.  Limit alcohol intake to no more than 1 drink a day for nonpregnant women and 2 drinks a day for men. Drinking alcohol  increases your risk of bleeding. One drink equals 12 oz of beer, 5 oz of wine, or 1 oz of hard liquor. Contact a health care provider if you:  Have unusual bleeding or bruising.  Have stomach pain or nausea.  Have ringing in your ears.  Have an allergic reaction that causes: ? Hives. ? Itchy skin. ? Swelling of the lips, tongue, or face. Get help right away if you:  Notice that your bowel movements are bloody, dark red, or black in color.  Vomit or cough up blood.  Have blood in your urine.  Cough, have noisy breathing (wheeze), or feel short of breath.  Have chest pain, especially if the pain spreads to the arms, back, neck, or jaw.  Have a severe headache, or a headache with confusion, or dizziness. These symptoms may represent a serious problem that is an emergency. Do not wait to see if the symptoms will go away. Get medical help right away. Call your local emergency services (911 in the [place] Do not drive yourself to the hospital. Summary  Aspirin can be used to help reduce the risk of blood clots, heart attacks, and other heart-related problems.  Daily use of aspirin can increase your risk of side effects. Your health care provider will help you determine whether it is safe and beneficial for you to take aspirin daily.  Take aspirin only as told by your health care provider. Make sure that you understand how much you can take and what form you can take. This information is not intended to replace advice given to you by your health care provider. Make sure you discuss any questions you have with your health care provider. Document Released: 12/11/2007 Document Revised: 10/28/2016 Document Reviewed: 10/28/2016 Elsevier Patient Education  2020 [employer].

## 2018-11-13 NOTE — Progress Notes (Signed)
Virtual Visit via Video Note   This visit type was conducted due to national recommendations for restrictions regarding the COVID-19 Pandemic (e.g. social distancing) in an effort to limit this patient's exposure and mitigate transmission in our community.  Due to her co-morbid illnesses, this patient is at least at moderate risk for complications without adequate follow up.  This format is felt to be most appropriate for this patient at this time.  All issues noted in this document were discussed and addressed.  A limited physical exam was performed with this format.  Please refer to the patient's chart for her consent to telehealth for [facility].  Evaluation Performed:  Follow-up visit  This visit type was conducted due to national recommendations for restrictions regarding the COVID-19 Pandemic (e.g. social distancing).  This format is felt to be most appropriate for this patient at this time.  All issues noted in this document were discussed and addressed.  No physical exam was performed (except for noted visual exam findings with Video Visits).  Please refer to the patient's chart (MyChart message for video visits and phone note for telephone visits) for the patient's consent to telehealth for [facility].  Date:  11/13/2018  ID: Cheyenne Hawkins, DOB 02-27-1975, MRN [id number]   Patient Location: [place] [id number]   Provider location:   [place] [facility]  PCP:  Debbrah Alar, NP  Cardiologist:  Jenne Campus, MD     Chief Complaint: Doing well but have some chest pain  History of Present Illness:    Cheyenne Hawkins is a 43 y.o. female  who presents via audio/video conferencing for a telehealth visit today.  With history of questionable mitral valve prolapse, echocardiogram repeated did not show it.  Also history of one episode of paroxysmal atrial fibrillation while she was hyperthyroidic.  She requested visit with me today  because of 2 issues #1 she started getting concerned about her blood pressure she wakes up in the morning first thing she does check her blood pressure and she noted her blood pressure being elevated.  Second concern that she has is the fact that she does complain of having some chest pain.  Pain is atypical pressure-like lasting for few minutes not related to exercise at the same time she is able to walk times test with no difficulty, then note associated symptoms with it.  Severity of the sensations about 4 in scale up to 10.   The patient does not have symptoms concerning for COVID-19 infection (fever, chills, cough, or new SHORTNESS OF BREATH).    Prior CV studies:   The following studies were reviewed today:  Echocardiogram from year ago reviewed no evidence of mitral valve prolapse, preserved ejection fraction     Past Medical History:  Diagnosis Date   Anemia, iron deficiency 11/19/2011   Depression 2012   Fatty liver 03/21/2011   GERD (gastroesophageal reflux disease)    Grave's disease 2002   s/p thyroidectomy   Heart murmur    History of chicken pox    HSV-2 (herpes simplex virus 2) infection     Past Surgical History:  Procedure Laterality Date   CESAREAN SECTION  2001   CESAREAN SECTION  2003   CESAREAN SECTION  2007   THYROIDECTOMY  2003   for graves disease   TUBAL LIGATION       Current Meds  Medication Sig   amLODipine (NORVASC) 2.5 MG tablet Take 1 tablet (2.5  mg total) by mouth daily.   escitalopram (LEXAPRO) 20 MG tablet Take 1 tablet (20 mg total) by mouth daily.   levothyroxine (SYNTHROID) 175 MCG tablet Take 1 tablet (175 mcg total) by mouth daily before breakfast.   Multiple Vitamins-Minerals (MULTIVITAMIN WITH MINERALS) tablet Take 1 tablet by mouth daily.   valACYclovir (VALTREX) 500 MG tablet Take 1 tablet (500 mg total) by mouth daily as needed.      Family History: The patient's family history includes Alcohol abuse in her  father and mother; Bipolar disorder in her mother; Cancer in her maternal grandmother and paternal grandmother; Depression in her maternal grandmother and mother; Diabetes in her father, maternal grandmother, and paternal grandmother; Hypertension in her father, maternal grandmother, and paternal grandmother.   ROS:   Please see the history of present illness.     All other systems reviewed and are negative.   Labs/Other Tests and Data Reviewed:     Recent Labs: 09/01/2018: ALT 14; BUN 12; Creat 0.61; Hemoglobin 11.2; Platelets 365; Potassium 4.3; Sodium 134 11/10/2018: TSH 0.49  Recent Lipid Panel    Component Value Date/Time   CHOL 175 09/01/2018 1501   TRIG 67 09/01/2018 1501   HDL 75 09/01/2018 1501   CHOLHDL 2.3 09/01/2018 1501   VLDL 15.4 05/30/2017 1609   LDLCALC 85 09/01/2018 1501      Exam:    Vital Signs:  BP (!) 145/93    Ht 5\' 4"  (1.626 m)    Wt 195 lb (88.5 kg)    BMI 33.47 kg/m     Wt Readings from Last 3 Encounters:  11/13/18 195 lb (88.5 kg)  11/10/18 198 lb (89.8 kg)  09/01/18 190 lb (86.2 kg)     Well nourished, well developed in no acute distress. Alert awake and x3 she is talking to me from her car she pull over and she is actually parking lot waiting for her children while at the dentist office.  She is not in any distress.  No chest pain no tightness no squeezing no pressure no burning chest.  I am in office in my home.  Diagnosis for this visit:   1. Paroxysmal atrial fibrillation (HCC)   2. Mitral valve prolapse   3. Atypical chest pain      ASSESSMENT & PLAN:    1.  Paroxysmal atrial fibrillation denies having any there was only episode when she had problem with her thyroid. 2.  Mitral valve prolapse denies having any issue with this.  Echocardiogram will be done previously showed no evidence of mitral valve prolapse. 3.  Atypical chest pain.  We had a long discussion about what to do with the situation I think the best test for her will  be to do calcium score.  It will give [place] some prognostic implications she agreed I will schedule her to have the test done.  I asked her to start taking 1 baby aspirin until we have this issue resolved. 4.  High blood pressure concerning ask you to check blood pressure maybe once or twice daily for next 10 days not in the morning when she wakes up just a little later as well as during the day.  I asked her to sit down for about 5 minutes and check her blood pressure taken out of it she will email her results to me within the next few days.  I do not see any reason to treat this and right now just need to be monitored.  COVID-19  Education: The signs and symptoms of COVID-19 were discussed with the patient and how to seek care for testing (follow up with PCP or arrange E-visit).  The importance of social distancing was discussed today.  Patient Risk:   After full review of this patients clinical status, I feel that they are at least moderate risk at this time.  Time:   Today, I have spent 5 minutes with the patient with telehealth technology discussing pt health issues.  I spent 22 minutes reviewing her chart before the visit.  Visit was finished at 3:29 PM.    Medication Adjustments/Labs and Tests Ordered: Current medicines are reviewed at length with the patient today.  Concerns regarding medicines are outlined above.  No orders of the defined types were placed in this encounter.  Medication changes: No orders of the defined types were placed in this encounter.    Disposition: Follow-up in 3 months  Signed, Park Liter, MD, [facility] 11/13/2018 3:26 PM    [place]

## 2018-11-13 NOTE — Addendum Note (Signed)
Addended by: Ashok Norris on: 11/13/2018 04:11 PM   Modules accepted: Orders

## 2018-11-23 MED FILL — AMLODIPINE 2.5 MG TABLET: 2.5 | 90 days supply | Qty: 90 | Fill #1

## 2018-12-08 MED FILL — ESCITALOPRAM 20 MG TABLET: 20 | 90 days supply | Qty: 90 | Fill #0

## 2019-01-17 MED FILL — LEVOTHYROXINE SODIUM 175 MC: 175 | 30 days supply | Qty: 30 | Fill #1

## 2019-02-20 MED FILL — LEVOTHYROXINE SODIUM 175 MC: 175 | 30 days supply | Qty: 30 | Fill #2

## 2019-03-02 ENCOUNTER — Ambulatory Visit: Payer: 59 | Admitting: Family

## 2019-03-09 ENCOUNTER — Encounter: Payer: Self-pay | Admitting: Family

## 2019-03-09 ENCOUNTER — Other Ambulatory Visit: Payer: Self-pay

## 2019-03-09 ENCOUNTER — Ambulatory Visit: Payer: 59 | Admitting: Family

## 2019-03-09 VITALS — BP 110/78 | HR 103 | Temp 97.8°F | Resp 18 | Ht 64.0 in | Wt 192.4 lb

## 2019-03-09 DIAGNOSIS — F418 Other specified anxiety disorders: Secondary | ICD-10-CM | POA: Diagnosis not present

## 2019-03-09 DIAGNOSIS — I10 Essential (primary) hypertension: Secondary | ICD-10-CM

## 2019-03-09 DIAGNOSIS — E039 Hypothyroidism, unspecified: Secondary | ICD-10-CM | POA: Diagnosis not present

## 2019-03-09 MED ORDER — AMLODIPINE BESYLATE 2.5 MG PO TABS: 2.5000 mg | ORAL_TABLET | Freq: Every day | ORAL | 1 refills | Status: DC

## 2019-03-09 MED ORDER — BUPROPION HCL ER (XL) 150 MG PO TB24: 150.0000 mg | ORAL_TABLET | Freq: Every day | ORAL | 3 refills | Status: DC

## 2019-03-09 MED FILL — buPROPion HCL ER (XL) 150 M: 150 | 30 days supply | Qty: 30 | Fill #0

## 2019-03-09 MED FILL — AMLODIPINE 2.5 MG TABLET: 2.5 | 90 days supply | Qty: 90 | Fill #0

## 2019-03-09 NOTE — Progress Notes (Signed)
Subjective:    Patient ID: Cheyenne Hawkins, female    DOB: 12/03/1975, 44 y.o.   MRN: [id number]  HPI   Patient is a 44 yr old female who presents today for follow up.  HTN- she is maintained on amlodipine 2.5mg  once daily.  BP Readings from Last 3 Encounters:  03/09/19 110/78  11/13/18 (!) 145/93  11/10/18 113/75   Wt Readings from Last 3 Encounters:  03/09/19 192 lb 6.4 oz (87.3 kg)  11/13/18 195 lb (88.5 kg)  11/10/18 198 lb (89.8 kg)   Hypothyroid- Reports feeling well on current dose of synthroid.  Lab Results  Component Value Date   TSH 0.49 11/10/2018   Depression/anxiety- she reports that she "chickened out."  Was afraid to approach her husband about separating.  She is still thinking about it and remains unsatisfied in the marriage.  Notes that her 38 yr old daughter is pregnant which is a surprise. States she wants to get herself to a good place so that she be supportive to her daughter.   Review of Systems    see HPI  Past Medical History:  Diagnosis Date  . Anemia, iron deficiency 11/19/2011  . Depression 2012  . Fatty liver 03/21/2011  . GERD (gastroesophageal reflux disease)   . Grave's disease 2002   s/p thyroidectomy  . Heart murmur   . History of chicken pox   . HSV-2 (herpes simplex virus 2) infection      Social History   Socioeconomic History  . Marital status: Married    Spouse name: Not on file  . Number of children: 3  . Years of education: Not on file  . Highest education level: Not on file  Occupational History    Employer: [employer]  Tobacco Use  . Smoking status: Never Smoker  . Smokeless tobacco: Never Used  Substance and Sexual Activity  . Alcohol use: Yes    Alcohol/week: 10.0 standard drinks    Types: 10 Glasses of wine per week  . Drug use: No  . Sexual activity: Yes    Birth control/protection: Surgical    Comment: btl  Other Topics Concern  . Not on file  Social History Narrative   Regular exercise:  No   Caffeine Use: 2 drinks weekly         Social Determinants of Health   Financial Resource Strain:   . Difficulty of Paying Living Expenses: Not on file  Food Insecurity:   . Worried About Charity fundraiser in the Last Year: Not on file  . Ran Out of Food in the Last Year: Not on file  Transportation Needs:   . Lack of Transportation (Medical): Not on file  . Lack of Transportation (Non-Medical): Not on file  Physical Activity:   . Days of Exercise per Week: Not on file  . Minutes of Exercise per Session: Not on file  Stress:   . Feeling of Stress : Not on file  Social Connections:   . Frequency of Communication with Friends and Family: Not on file  . Frequency of Social Gatherings with Friends and Family: Not on file  . Attends Religious Services: Not on file  . Active Member of Clubs or Organizations: Not on file  . Attends Archivist Meetings: Not on file  . Marital Status: Not on file  Intimate Partner Violence:   . Fear of Current or Ex-Partner: Not on file  . Emotionally Abused: Not on file  . Physically  Abused: Not on file  . Sexually Abused: Not on file    Past Surgical History:  Procedure Laterality Date  . CESAREAN SECTION  2001  . CESAREAN SECTION  2003  . CESAREAN SECTION  2007  . THYROIDECTOMY  2003   for graves disease  . TUBAL LIGATION      Family History  Problem Relation Age of Onset  . Alcohol abuse Mother   . Depression Mother   . Bipolar disorder Mother   . Alcohol abuse Father   . Diabetes Father   . Hypertension Father   . Cancer Maternal Grandmother        colon  . Depression Maternal Grandmother   . Diabetes Maternal Grandmother   . Hypertension Maternal Grandmother   . Cancer Paternal Grandmother        breast  . Diabetes Paternal Grandmother   . Hypertension Paternal Grandmother     No Known Allergies  Current Outpatient Medications on File Prior to Visit  Medication Sig Dispense Refill  . aspirin EC 81 MG tablet  Take 1 tablet (81 mg total) by mouth daily. 90 tablet 3  . escitalopram (LEXAPRO) 20 MG tablet Take 1 tablet (20 mg total) by mouth daily. 90 tablet 0  . levothyroxine (SYNTHROID) 175 MCG tablet Take 1 tablet (175 mcg total) by mouth daily before breakfast. 30 tablet 5  . Multiple Vitamins-Minerals (MULTIVITAMIN WITH MINERALS) tablet Take 1 tablet by mouth daily.    . valACYclovir (VALTREX) 500 MG tablet Take 1 tablet (500 mg total) by mouth daily as needed. 30 tablet 2   No current facility-administered medications on file prior to visit.    BP 110/78 (BP Location: Left Arm, Patient Position: Sitting, Cuff Size: Large)   Pulse (!) 103   Temp 97.8 F (36.6 C) (Temporal)   Resp 18   Ht 5\' 4"  (1.626 m)   Wt 192 lb 6.4 oz (87.3 kg)   SpO2 97%   BMI 33.03 kg/m    Objective:   Physical Exam Constitutional:      Appearance: She is well-developed.  Neck:     Thyroid: No thyromegaly.  Cardiovascular:     Rate and Rhythm: Normal rate and regular rhythm.     Heart sounds: Normal heart sounds. No murmur.  Pulmonary:     Effort: Pulmonary effort is normal. No respiratory distress.     Breath sounds: Normal breath sounds. No wheezing.  Musculoskeletal:     Cervical back: Neck supple.  Skin:    General: Skin is warm and dry.  Neurological:     Mental Status: She is alert and oriented to person, place, and time.  Psychiatric:        Behavior: Behavior normal.        Thought Content: Thought content normal.        Judgment: Judgment normal.             Depression/anxiety- anxiety seems stable but depression symptoms could be improved. Will continue lexapro and add wellbutrin once daily.  Hypothyroid- clinically stable on synthroid. She will return for lab visit to check TSH when lab is open.  HTN- bp is stable on current dose of amlodipine. Continue same.

## 2019-03-09 NOTE — Patient Instructions (Signed)
Please add wellbutrin once daily.

## 2019-03-19 ENCOUNTER — Other Ambulatory Visit: Payer: Self-pay

## 2019-03-19 ENCOUNTER — Other Ambulatory Visit: Payer: Self-pay | Admitting: Family

## 2019-03-19 ENCOUNTER — Other Ambulatory Visit (INDEPENDENT_AMBULATORY_CARE_PROVIDER_SITE_OTHER): Payer: 59

## 2019-03-19 DIAGNOSIS — I10 Essential (primary) hypertension: Secondary | ICD-10-CM | POA: Diagnosis not present

## 2019-03-19 DIAGNOSIS — E039 Hypothyroidism, unspecified: Secondary | ICD-10-CM

## 2019-03-19 DIAGNOSIS — D649 Anemia, unspecified: Secondary | ICD-10-CM

## 2019-03-19 LAB — BASIC METABOLIC PANEL
BUN: 8 mg/dL (ref 6–23)
CO2: 28 mEq/L (ref 19–32)
Calcium: 9.4 mg/dL (ref 8.4–10.5)
Chloride: 100 mEq/L (ref 96–112)
Creatinine, Ser: 0.53 mg/dL (ref 0.40–1.20)
GFR: 150.85 mL/min (ref >60.00)
Glucose, Bld: 126 mg/dL — ABNORMAL HIGH (ref 70–99)
Potassium: 3.9 mEq/L (ref 3.5–5.1)
Sodium: 135 mEq/L (ref 135–145)

## 2019-03-19 LAB — TSH: TSH: 0.02 u[IU]/mL — ABNORMAL LOW (ref 0.35–4.50)

## 2019-03-19 LAB — FERRITIN: Ferritin: 7.6 ng/mL — ABNORMAL LOW (ref 10.0–291.0)

## 2019-03-19 MED FILL — ESCITALOPRAM 20 MG TABLET: 20 | 90 days supply | Qty: 90 | Fill #0

## 2019-03-19 MED FILL — LEVOTHYROXINE SODIUM 175 MC: 175 | 30 days supply | Qty: 30 | Fill #3

## 2019-03-20 ENCOUNTER — Other Ambulatory Visit (INDEPENDENT_AMBULATORY_CARE_PROVIDER_SITE_OTHER): Payer: 59

## 2019-03-20 DIAGNOSIS — E0590 Thyrotoxicosis, unspecified without thyrotoxic crisis or storm: Secondary | ICD-10-CM | POA: Diagnosis not present

## 2019-03-20 LAB — T4, FREE: Free T4: 1.51 ng/dL (ref 0.60–1.60)

## 2019-03-20 LAB — T3, FREE: T3, Free: 3.7 pg/mL (ref 2.3–4.2)

## 2019-03-21 ENCOUNTER — Telehealth: Payer: Self-pay | Admitting: Family

## 2019-03-21 MED ORDER — LEVOTHYROXINE SODIUM 150 MCG PO TABS: 150.0000 ug | ORAL_TABLET | Freq: Every day | ORAL | 1 refills | Status: DC

## 2019-03-21 MED FILL — LEVOTHYROXINE SODIUM 150 MC: 150 | 90 days supply | Qty: 90 | Fill #0

## 2019-03-21 NOTE — Telephone Encounter (Signed)
Lvm for patient to call for results

## 2019-03-21 NOTE — Telephone Encounter (Addendum)
Please let pt know that I reviewed her lab work. Looks like synthroid is too strong. I would like hr to decrease synthroid from 175 mcg to 179mcg once daily and repeat tsh in 6 weeks, dx hypothyroid.   Also, her sugar is elevated. I would like her to work on healthy low sugar diet/exercise/weight loss.  Let's add A1C to her 6 week lab apt. Dx hyperglycemia.

## 2019-03-22 ENCOUNTER — Other Ambulatory Visit: Payer: Self-pay

## 2019-03-22 DIAGNOSIS — R739 Hyperglycemia, unspecified: Secondary | ICD-10-CM

## 2019-03-22 DIAGNOSIS — E039 Hypothyroidism, unspecified: Secondary | ICD-10-CM

## 2019-03-22 NOTE — Telephone Encounter (Signed)
Patient called back and was advised of results and providers advised to increase medication dose. Also advise of low sugar diet. She was scheduled for labs on 05-11-2019

## 2019-04-02 MED FILL — LEVOTHYROXINE SODIUM 150 MC: 150 | 90 days supply | Qty: 90 | Fill #0

## 2019-04-02 MED FILL — VALACYCLOVIR HCL 500 MG TAB: 500 | 30 days supply | Qty: 30 | Fill #1

## 2019-04-02 MED FILL — ESCITALOPRAM 20 MG TABLET: 20 | 90 days supply | Qty: 90 | Fill #0

## 2019-04-03 ENCOUNTER — Encounter: Payer: Self-pay | Admitting: Family

## 2019-04-06 ENCOUNTER — Ambulatory Visit: Payer: 59 | Admitting: Family

## 2019-04-09 ENCOUNTER — Ambulatory Visit (INDEPENDENT_AMBULATORY_CARE_PROVIDER_SITE_OTHER): Payer: 59 | Admitting: Family

## 2019-04-09 ENCOUNTER — Other Ambulatory Visit: Payer: Self-pay

## 2019-04-09 DIAGNOSIS — F418 Other specified anxiety disorders: Secondary | ICD-10-CM

## 2019-04-09 NOTE — Progress Notes (Signed)
Virtual Visit via Video Note  I connected with Cheyenne Hawkins on 04/09/19 at  7:20 AM EDT by a video enabled telemedicine application and verified that I am speaking with the correct person using two identifiers. We did not have audio and as a result transitioned to a telephone visit.   Location: Patient: home Provider: work   I discussed the limitations of evaluation and management by telemedicine and the availability of in person appointments. The patient expressed understanding and agreed to proceed.  History of Present Illness:  Patient is a 44 yr old female who presents today for follow up of her depression.  Last visit we added Wellbutrin to her lexapro.She reports that since starting wellbutrin she is a bit more relaxed/less anxious.  Sleeping OK.  Reports that she is tolerating medication without side effect.  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 She is looking forward to this position.   Past Medical History:  Diagnosis Date  . Anemia, iron deficiency 11/19/2011  . Depression 2012  . Fatty liver 03/21/2011  . GERD (gastroesophageal reflux disease)   . Grave's disease 2002   s/p thyroidectomy  . Heart murmur   . History of chicken pox   . HSV-2 (herpes simplex virus 2) infection      Social History   Socioeconomic History  . Marital status: Married    Spouse name: Not on file  . Number of children: 3  . Years of education: Not on file  . Highest education level: Not on file  Occupational History    Employer: [employer]  Tobacco Use  . Smoking status: Never Smoker  . Smokeless tobacco: Never Used  Substance and Sexual Activity  . Alcohol use: Yes    Alcohol/week: 10.0 standard drinks    Types: 10 Glasses of wine per week  . Drug use: No  . Sexual activity: Yes    Birth control/protection: Surgical    Comment: btl  Other Topics Concern  . Not on file  Social History Narrative   Regular exercise:  No   Caffeine  Use: 2 drinks weekly         Social Determinants of Health   Financial Resource Strain:   . Difficulty of Paying Living Expenses:   Food Insecurity:   . Worried About Charity fundraiser in the Last Year:   . Arboriculturist in the Last Year:   Transportation Needs:   . Film/video editor (Medical):   Marland Kitchen Lack of Transportation (Non-Medical):   Physical Activity:   . Days of Exercise per Week:   . Minutes of Exercise per Session:   Stress:   . Feeling of Stress :   Social Connections:   . Frequency of Communication with Friends and Family:   . Frequency of Social Gatherings with Friends and Family:   . Attends Religious Services:   . Active Member of Clubs or Organizations:   . Attends Archivist Meetings:   Marland Kitchen Marital Status:   Intimate Partner Violence:   . Fear of Current or Ex-Partner:   . Emotionally Abused:   Marland Kitchen Physically Abused:   . Sexually Abused:     Past Surgical History:  Procedure Laterality Date  . CESAREAN SECTION  2001  . CESAREAN SECTION  2003  . CESAREAN SECTION  2007  . THYROIDECTOMY  2003   for graves disease  . TUBAL LIGATION  Family History  Problem Relation Age of Onset  . Alcohol abuse Mother   . Depression Mother   . Bipolar disorder Mother   . Alcohol abuse Father   . Diabetes Father   . Hypertension Father   . Cancer Maternal Grandmother        colon  . Depression Maternal Grandmother   . Diabetes Maternal Grandmother   . Hypertension Maternal Grandmother   . Cancer Paternal Grandmother        breast  . Diabetes Paternal Grandmother   . Hypertension Paternal Grandmother     No Known Allergies  Current Outpatient Medications on File Prior to Visit  Medication Sig Dispense Refill  . amLODipine (NORVASC) 2.5 MG tablet Take 1 tablet (2.5 mg total) by mouth daily. 90 tablet 1  . aspirin EC 81 MG tablet Take 1 tablet (81 mg total) by mouth daily. 90 tablet 3  . buPROPion (WELLBUTRIN XL) 150 MG 24 hr tablet Take 1  tablet (150 mg total) by mouth daily. 30 tablet 3  . escitalopram (LEXAPRO) 20 MG tablet TAKE 1 TABLET BY MOUTH ONCE DAILY 90 tablet 1  . levothyroxine (SYNTHROID) 150 MCG tablet Take 1 tablet (150 mcg total) by mouth daily. 90 tablet 1  . Multiple Vitamins-Minerals (MULTIVITAMIN WITH MINERALS) tablet Take 1 tablet by mouth daily.    . valACYclovir (VALTREX) 500 MG tablet Take 1 tablet (500 mg total) by mouth daily as needed. 30 tablet 2   No current facility-administered medications on file prior to visit.    There were no vitals taken for this visit.       Observations/Objective:   Gen: Awake, alert, no acute distress Resp: Breathing is even and non-labored Psych: calm/pleasant demeanor Neuro: Alert and Oriented x 3, + facial symmetry, speech is clear.   Assessment and Plan:  Depression/anxiety- stable/improved. Continue current meds/doses.    Plan to re-evaluate in 3 months.   Follow Up Instructions:    I discussed the assessment and treatment plan with the patient. The patient was provided an opportunity to ask questions and all were answered. The patient agreed with the plan and demonstrated an understanding of the instructions.   The patient was advised to call back or seek an in-person evaluation if the symptoms worsen or if the condition fails to improve as anticipated.  Nance Pear, NP

## 2019-04-16 MED FILL — buPROPion HCL ER (XL) 150 M: 150 | 30 days supply | Qty: 30 | Fill #1

## 2019-05-11 ENCOUNTER — Other Ambulatory Visit: Payer: 59

## 2019-05-18 ENCOUNTER — Other Ambulatory Visit: Payer: Self-pay

## 2019-05-18 ENCOUNTER — Other Ambulatory Visit (INDEPENDENT_AMBULATORY_CARE_PROVIDER_SITE_OTHER): Payer: 59

## 2019-05-18 DIAGNOSIS — R739 Hyperglycemia, unspecified: Secondary | ICD-10-CM

## 2019-05-18 DIAGNOSIS — E039 Hypothyroidism, unspecified: Secondary | ICD-10-CM | POA: Diagnosis not present

## 2019-05-18 LAB — TSH: TSH: 0.5 u[IU]/mL (ref 0.35–4.50)

## 2019-05-18 LAB — HEMOGLOBIN A1C: Hgb A1c MFr Bld: 5.9 % (ref 4.6–6.5)

## 2019-06-12 MED FILL — AMLODIPINE 2.5 MG TABLET: 2.5 | 90 days supply | Qty: 90 | Fill #1

## 2019-07-05 MED FILL — VALACYCLOVIR HCL 500 MG TAB: 500 | 30 days supply | Qty: 30 | Fill #2

## 2019-07-05 MED FILL — buPROPion HCL ER (XL) 150 M: 150 | 30 days supply | Qty: 30 | Fill #3

## 2019-07-11 ENCOUNTER — Ambulatory Visit: Payer: 59 | Admitting: Family

## 2019-07-11 ENCOUNTER — Other Ambulatory Visit: Payer: Self-pay

## 2019-07-11 ENCOUNTER — Encounter: Payer: Self-pay | Admitting: Family

## 2019-07-11 ENCOUNTER — Ambulatory Visit (HOSPITAL_BASED_OUTPATIENT_CLINIC_OR_DEPARTMENT_OTHER)
Admission: RE | Admit: 2019-07-11 | Discharge: 2019-07-11 | Disposition: A | Discharge status: Home / Self Care | Payer: 59 | Source: Ambulatory Visit | Attending: Family | Admitting: Family

## 2019-07-11 VITALS — BP 109/71 | HR 84 | Temp 97.8°F | Resp 16 | Ht 64.0 in | Wt 194.0 lb

## 2019-07-11 DIAGNOSIS — M5416 Radiculopathy, lumbar region: Secondary | ICD-10-CM

## 2019-07-11 DIAGNOSIS — M2011 Hallux valgus (acquired), right foot: Secondary | ICD-10-CM | POA: Diagnosis not present

## 2019-07-11 DIAGNOSIS — M79674 Pain in right toe(s): Secondary | ICD-10-CM | POA: Insufficient documentation

## 2019-07-11 DIAGNOSIS — M19071 Primary osteoarthritis, right ankle and foot: Secondary | ICD-10-CM | POA: Diagnosis not present

## 2019-07-11 MED ORDER — MELOXICAM 7.5 MG PO TABS: 7.5000 mg | ORAL_TABLET | Freq: Every day | ORAL | 0 refills | Status: DC

## 2019-07-11 MED FILL — MELOXICAM 7.5 MG TABLET: 7.5 | 14 days supply | Qty: 14 | Fill #0

## 2019-07-11 NOTE — Progress Notes (Signed)
Subjective:    Patient ID: Cheyenne Hawkins, female    DOB: 1975/04/17, 44 y.o.   MRN: [id number]  HPI  Patient is a 44 yr old female who presents today following a toe injury. Reports pain in the right 4th toe. Occurred 1 month ago. She states that she was wearing socks and slipped walking up the stairs.  Still having pain and discoloration.   Also having a burning sensation down the right shin some times.  She reports some low back pain which radiates into her right hip. .  Occasionally has pain in the left shin.    Review of Systems    see HPI  Past Medical History:  Diagnosis Date  . Anemia, iron deficiency 11/19/2011  . Depression 2012  . Fatty liver 03/21/2011  . GERD (gastroesophageal reflux disease)   . Grave's disease 2002   s/p thyroidectomy  . Heart murmur   . History of chicken pox   . HSV-2 (herpes simplex virus 2) infection      Social History   Socioeconomic History  . Marital status: Married    Spouse name: Not on file  . Number of children: 3  . Years of education: Not on file  . Highest education level: Not on file  Occupational History    Employer: [employer]  Tobacco Use  . Smoking status: Never Smoker  . Smokeless tobacco: Never Used  Vaping Use  . Vaping Use: Never used  Substance and Sexual Activity  . Alcohol use: Yes    Alcohol/week: 10.0 standard drinks    Types: 10 Glasses of wine per week  . Drug use: No  . Sexual activity: Yes    Birth control/protection: Surgical    Comment: btl  Other Topics Concern  . Not on file  Social History Narrative   Regular exercise:  No   Caffeine Use: 2 drinks weekly         Social Determinants of Health   Financial Resource Strain:   . Difficulty of Paying Living Expenses:   Food Insecurity:   . Worried About Charity fundraiser in the Last Year:   . Arboriculturist in the Last Year:   Transportation Needs:   . Film/video editor (Medical):   Marland Kitchen Lack of Transportation (Non-Medical):     Physical Activity:   . Days of Exercise per Week:   . Minutes of Exercise per Session:   Stress:   . Feeling of Stress :   Social Connections:   . Frequency of Communication with Friends and Family:   . Frequency of Social Gatherings with Friends and Family:   . Attends Religious Services:   . Active Member of Clubs or Organizations:   . Attends Archivist Meetings:   Marland Kitchen Marital Status:   Intimate Partner Violence:   . Fear of Current or Ex-Partner:   . Emotionally Abused:   Marland Kitchen Physically Abused:   . Sexually Abused:     Past Surgical History:  Procedure Laterality Date  . CESAREAN SECTION  2001  . CESAREAN SECTION  2003  . CESAREAN SECTION  2007  . THYROIDECTOMY  2003   for graves disease  . TUBAL LIGATION      Family History  Problem Relation Age of Onset  . Alcohol abuse Mother   . Depression Mother   . Bipolar disorder Mother   . Alcohol abuse Father   . Diabetes Father   . Hypertension Father   .  Cancer Maternal Grandmother        colon  . Depression Maternal Grandmother   . Diabetes Maternal Grandmother   . Hypertension Maternal Grandmother   . Cancer Paternal Grandmother        breast  . Diabetes Paternal Grandmother   . Hypertension Paternal Grandmother     No Known Allergies  Current Outpatient Medications on File Prior to Visit  Medication Sig Dispense Refill  . amLODipine (NORVASC) 2.5 MG tablet Take 1 tablet (2.5 mg total) by mouth daily. 90 tablet 1  . aspirin EC 81 MG tablet Take 1 tablet (81 mg total) by mouth daily. 90 tablet 3  . buPROPion (WELLBUTRIN XL) 150 MG 24 hr tablet Take 1 tablet (150 mg total) by mouth daily. 30 tablet 3  . escitalopram (LEXAPRO) 20 MG tablet TAKE 1 TABLET BY MOUTH ONCE DAILY 90 tablet 1  . levothyroxine (SYNTHROID) 150 MCG tablet Take 1 tablet (150 mcg total) by mouth daily. 90 tablet 1  . Multiple Vitamins-Minerals (MULTIVITAMIN WITH MINERALS) tablet Take 1 tablet by mouth daily.    . valACYclovir  (VALTREX) 500 MG tablet Take 1 tablet (500 mg total) by mouth daily as needed. 30 tablet 2   No current facility-administered medications on file prior to visit.    BP 109/71 (BP Location: Right Arm, Patient Position: Sitting, Cuff Size: Large)   Pulse 84   Temp 97.8 F (36.6 C) (Temporal)   Resp 16   Ht 5\' 4"  (1.626 m)   Wt 194 lb (88 kg)   SpO2 100%   BMI 33.30 kg/m    Objective:   Physical Exam Constitutional:      Appearance: Normal appearance. She is not ill-appearing or diaphoretic.  Musculoskeletal:     Cervical back: Normal.     Thoracic back: Normal.     Lumbar back: Normal.     Comments: No lumbar or thoracic spine tenderness to palpation  R 4th toe with mild swelling, + bruising.  + tenderness to palpation of the right distal tip of the 4th toe  Neurological:     Mental Status: She is alert.           Assessment & Plan:  Toe injury of the right 4th toe-  Will obtain x-ray for further evaluation. She is concerned about possibility of osteoporosis given duration of her use of synthroid. We discussed that if the toe is fractured, then we will obtain a bone density.  Lumbar radiculopathy- I suspect the burning pain on both shins is likely related L5 radiculopathy. Will give a trial of meloxicam.   This visit occurred during the SARS-CoV-2 public health emergency.  Safety protocols were in place, including screening questions prior to the visit, additional usage of staff PPE, and extensive cleaning of exam room while observing appropriate contact time as indicated for disinfecting solutions.

## 2019-07-11 NOTE — Patient Instructions (Signed)
Please complete x-ray on the first floor. Start meloxicam once daily. Let me know if your symptoms worsen or if they fail to improve.

## 2019-07-17 MED FILL — LEVOTHYROXINE SODIUM 150 MC: 150 | 90 days supply | Qty: 90 | Fill #1

## 2019-07-17 MED FILL — ESCITALOPRAM 20 MG TABLET: 20 | 90 days supply | Qty: 90 | Fill #1

## 2019-08-09 ENCOUNTER — Other Ambulatory Visit: Payer: Self-pay | Admitting: Family

## 2019-08-09 MED FILL — buPROPion HCL ER (XL) 150 M: 150 | 30 days supply | Qty: 30 | Fill #0

## 2019-08-28 MED FILL — buPROPion HCL ER (XL) 150 M: 150 | 30 days supply | Qty: 30 | Fill #0

## 2019-10-12 MED FILL — buPROPion HCL ER (XL) 150 M: 150 | 30 days supply | Qty: 30 | Fill #1

## 2019-10-23 MED FILL — buPROPion HCL ER (XL) 150 M: 150 | 30 days supply | Qty: 30 | Fill #1

## 2019-10-29 ENCOUNTER — Other Ambulatory Visit: Payer: Self-pay | Admitting: Family

## 2019-10-29 MED FILL — AMLODIPINE 2.5 MG TABLET: 2.5 | 90 days supply | Qty: 90 | Fill #0

## 2019-12-07 ENCOUNTER — Other Ambulatory Visit: Payer: Self-pay | Admitting: Family

## 2019-12-08 ENCOUNTER — Other Ambulatory Visit: Payer: Self-pay | Admitting: Family

## 2019-12-10 MED FILL — ESCITALOPRAM 20 MG TABLET: 20 | 90 days supply | Qty: 90 | Fill #0

## 2019-12-10 MED FILL — LEVOTHYROXINE SODIUM 150 MC: 150 | 90 days supply | Qty: 90 | Fill #0

## 2019-12-10 MED FILL — buPROPion HCL ER (XL) 150 M: 150 | 30 days supply | Qty: 30 | Fill #0

## 2020-03-03 ENCOUNTER — Encounter: Payer: Self-pay | Admitting: Family

## 2020-03-03 ENCOUNTER — Ambulatory Visit: Payer: 59 | Admitting: Family

## 2020-03-13 DIAGNOSIS — M5412 Radiculopathy, cervical region: Secondary | ICD-10-CM | POA: Diagnosis not present

## 2020-03-13 DIAGNOSIS — M67471 Ganglion, right ankle and foot: Secondary | ICD-10-CM | POA: Diagnosis not present

## 2020-03-14 ENCOUNTER — Other Ambulatory Visit (HOSPITAL_BASED_OUTPATIENT_CLINIC_OR_DEPARTMENT_OTHER): Payer: Self-pay | Admitting: Orthopedic Surgery

## 2020-03-14 MED FILL — DICLOFENAC SODIUM 75 MG TAB: 75 | 30 days supply | Qty: 60 | Fill #0

## 2020-03-15 ENCOUNTER — Other Ambulatory Visit (HOSPITAL_BASED_OUTPATIENT_CLINIC_OR_DEPARTMENT_OTHER): Payer: Self-pay | Admitting: Orthopedic Surgery

## 2020-03-15 MED FILL — predniSONE 10 MG TABS: 10 | 12 days supply | Qty: 48 | Fill #0

## 2020-03-18 ENCOUNTER — Other Ambulatory Visit: Payer: Self-pay

## 2020-03-18 ENCOUNTER — Other Ambulatory Visit: Payer: Self-pay | Admitting: Family

## 2020-03-18 ENCOUNTER — Ambulatory Visit: Payer: 59 | Admitting: Family

## 2020-03-18 ENCOUNTER — Other Ambulatory Visit (HOSPITAL_COMMUNITY)
Admission: RE | Admit: 2020-03-18 | Discharge: 2020-03-18 | Disposition: A | Discharge status: Home / Self Care | Payer: 59 | Source: Ambulatory Visit | Attending: Family | Admitting: Family

## 2020-03-18 ENCOUNTER — Encounter: Payer: Self-pay | Admitting: Family

## 2020-03-18 VITALS — BP 130/64 | HR 84 | Temp 98.1°F | Ht 64.0 in | Wt 193.6 lb

## 2020-03-18 DIAGNOSIS — B009 Herpesviral infection, unspecified: Secondary | ICD-10-CM

## 2020-03-18 DIAGNOSIS — Z113 Encounter for screening for infections with a predominantly sexual mode of transmission: Secondary | ICD-10-CM

## 2020-03-18 DIAGNOSIS — H52223 Regular astigmatism, bilateral: Secondary | ICD-10-CM | POA: Diagnosis not present

## 2020-03-18 DIAGNOSIS — M5412 Radiculopathy, cervical region: Secondary | ICD-10-CM | POA: Diagnosis not present

## 2020-03-18 DIAGNOSIS — I10 Essential (primary) hypertension: Secondary | ICD-10-CM | POA: Diagnosis not present

## 2020-03-18 DIAGNOSIS — F418 Other specified anxiety disorders: Secondary | ICD-10-CM

## 2020-03-18 DIAGNOSIS — H5213 Myopia, bilateral: Secondary | ICD-10-CM | POA: Diagnosis not present

## 2020-03-18 DIAGNOSIS — H524 Presbyopia: Secondary | ICD-10-CM | POA: Diagnosis not present

## 2020-03-18 MED ORDER — BUPROPION HCL ER (XL) 150 MG PO TB24: ORAL_TABLET | ORAL | 1 refills | Status: DC

## 2020-03-18 MED ORDER — VALACYCLOVIR HCL 500 MG PO TABS: 500.0000 mg | ORAL_TABLET | Freq: Every day | ORAL | 2 refills | Status: DC | PRN

## 2020-03-18 MED FILL — VALACYCLOVIR HCL 500 MG TAB: 500 | 30 days supply | Qty: 30 | Fill #0

## 2020-03-18 MED FILL — buPROPion HCL ER (XL) 150 M: 150 | 90 days supply | Qty: 90 | Fill #0

## 2020-03-18 NOTE — Progress Notes (Signed)
Subjective:    Patient ID: Cheyenne Hawkins, female    DOB: 01/31/1975, 45 y.o.   MRN: [id number]  HPI Patient presents today for follow up.   She saw Dr Noemi Chapel (orthopedics) re: tingling in the right arm/pinky. He rx'd prednisone with plan to transition to diclofenac.  Had x-ray which reportedly showed extensive cervical arthritis.   Reports ganglion cyst right foot- had it drained by ortho.    Ran out of wellbutrin >1 month ago. She continues lexapro.  Not sleeping well. Using melatonin or zquil.  Did feel better on wellbutrin, wishes to restart.   Would like STD screening due to information she learned about her husband.   Has a new grandson who she is very excited about. 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   Review of Systems    see HPI  Past Medical History:  Diagnosis Date  . Anemia, iron deficiency 11/19/2011  . Depression 2012  . Fatty liver 03/21/2011  . GERD (gastroesophageal reflux disease)   . Grave's disease 2002   s/p thyroidectomy  . Heart murmur   . History of chicken pox   . HSV-2 (herpes simplex virus 2) infection      Social History   Socioeconomic History  . Marital status: Married    Spouse name: Not on file  . Number of children: 3  . Years of education: Not on file  . Highest education level: Not on file  Occupational History    Employer: [employer]  Tobacco Use  . Smoking status: Never Smoker  . Smokeless tobacco: Never Used  Vaping Use  . Vaping Use: Never used  Substance and Sexual Activity  . Alcohol use: Yes    Alcohol/week: 10.0 standard drinks    Types: 10 Glasses of wine per week  . Drug use: No  . Sexual activity: Yes    Birth control/protection: Surgical    Comment: btl  Other Topics Concern  . Not on file  Social History Narrative   Regular exercise:  No   Caffeine Use: 2 drinks weekly         Social Determinants of Health   Financial Resource Strain: Not on  file  Food Insecurity: Not on file  Transportation Needs: Not on file  Physical Activity: Not on file  Stress: Not on file  Social Connections: Not on file  Intimate Partner Violence: Not on file    Past Surgical History:  Procedure Laterality Date  . CESAREAN SECTION  2001  . CESAREAN SECTION  2003  . CESAREAN SECTION  2007  . THYROIDECTOMY  2003   for graves disease  . TUBAL LIGATION      Family History  Problem Relation Age of Onset  . Alcohol abuse Mother   . Depression Mother   . Bipolar disorder Mother   . Alcohol abuse Father   . Diabetes Father   . Hypertension Father   . Cancer Maternal Grandmother        colon  . Depression Maternal Grandmother   . Diabetes Maternal Grandmother   . Hypertension Maternal Grandmother   . Cancer Paternal Grandmother        breast  . Diabetes Paternal Grandmother   . Hypertension Paternal Grandmother     No Known Allergies  Current Outpatient Medications on File Prior to Visit  Medication Sig Dispense Refill  . amLODipine (NORVASC) 2.5 MG tablet Take 1 tablet (2.5 mg total)  by mouth daily. 90 tablet 1  . aspirin EC 81 MG tablet Take 1 tablet (81 mg total) by mouth daily. 90 tablet 3  . escitalopram (LEXAPRO) 20 MG tablet TAKE 1 TABLET BY MOUTH ONCE DAILY 90 tablet 1  . levothyroxine (SYNTHROID) 150 MCG tablet TAKE 1 TABLET (150 MCG TOTAL) BY MOUTH DAILY. 90 tablet 1  . Multiple Vitamins-Minerals (MULTIVITAMIN WITH MINERALS) tablet Take 1 tablet by mouth daily.     No current facility-administered medications on file prior to visit.    BP 130/64   Pulse 84   Temp 98.1 F (36.7 C) (Temporal)   Ht 5\' 4"  (1.626 m)   Wt 193 lb 9.6 oz (87.8 kg)   LMP 03/18/2020   SpO2 99%   BMI 33.23 kg/m    Objective:   Physical Exam Constitutional:      Appearance: She is well-developed and well-nourished.  Cardiovascular:     Rate and Rhythm: Normal rate and regular rhythm.     Heart sounds: Normal heart sounds. No murmur  heard.   Pulmonary:     Effort: Pulmonary effort is normal. No respiratory distress.     Breath sounds: Normal breath sounds. No wheezing.  Psychiatric:        Mood and Affect: Mood and affect normal.        Behavior: Behavior normal.        Thought Content: Thought content normal.        Judgment: Judgment normal.           Assessment & Plan:  Cervical radiculopathy- advised pt that she will likely experience improvement in her symptoms with the prednisone which she started today.   STD screening- has menstrual cycle today- will send urine ancillary testing as well as HIV, RPR.   Depression- not optimally controlled. Will restart Wellbutrin xl 150mg  once daily.  Continue lexapro 20mg  once daily.   HTN- BP stable. Continue amlodipine 2.5mg  once daily.  BP Readings from Last 3 Encounters:  03/18/20 130/64  07/11/19 109/71  03/09/19 110/78   HSV2- continues valtrex 500mg  prn. Has infrequent breakouts.  Continue same.   I did encourage her to get a covid booster.   This visit occurred during the SARS-CoV-2 public health emergency.  Safety protocols were in place, including screening questions prior to the visit, additional usage of staff PPE, and extensive cleaning of exam room while observing appropriate contact time as indicated for disinfecting solutions.

## 2020-03-19 LAB — HIV ANTIBODY (ROUTINE TESTING W REFLEX): HIV 1&2 Ab, 4th Generation: NONREACTIVE

## 2020-03-20 LAB — T PALLIDUM ANTIBODY, EIA: T pallidum Antibody, EIA: NEGATIVE

## 2020-03-20 LAB — URINE CYTOLOGY ANCILLARY ONLY
Chlamydia: NEGATIVE
Comment: NEGATIVE
Comment: NORMAL
Neisseria Gonorrhea: NEGATIVE

## 2020-03-20 LAB — RPR W/REFLEX TO TREPSURE: RPR: NONREACTIVE

## 2020-03-20 MED FILL — AMLODIPINE 2.5 MG TABLET: 2.5 | 90 days supply | Qty: 90 | Fill #1

## 2020-04-23 ENCOUNTER — Other Ambulatory Visit (HOSPITAL_BASED_OUTPATIENT_CLINIC_OR_DEPARTMENT_OTHER): Payer: Self-pay

## 2020-04-23 MED FILL — Escitalopram Oxalate Tab 20 MG (Base Equiv): ORAL | 90 days supply | Qty: 90 | Fill #0 | Status: AC

## 2020-04-23 MED FILL — Levothyroxine Sodium Tab 150 MCG: ORAL | 90 days supply | Qty: 90 | Fill #0 | Status: AC

## 2020-04-30 ENCOUNTER — Ambulatory Visit (INDEPENDENT_AMBULATORY_CARE_PROVIDER_SITE_OTHER): Payer: 59 | Admitting: Family

## 2020-04-30 ENCOUNTER — Other Ambulatory Visit (HOSPITAL_COMMUNITY)
Admission: RE | Admit: 2020-04-30 | Discharge: 2020-04-30 | Disposition: A | Discharge status: Home / Self Care | Payer: 59 | Source: Ambulatory Visit | Attending: Family | Admitting: Family

## 2020-04-30 ENCOUNTER — Encounter: Payer: Self-pay | Admitting: Family

## 2020-04-30 ENCOUNTER — Other Ambulatory Visit (HOSPITAL_BASED_OUTPATIENT_CLINIC_OR_DEPARTMENT_OTHER): Payer: Self-pay

## 2020-04-30 ENCOUNTER — Other Ambulatory Visit: Payer: Self-pay

## 2020-04-30 VITALS — BP 116/80 | HR 81 | Temp 98.0°F | Resp 16 | Ht 64.0 in | Wt 193.0 lb

## 2020-04-30 DIAGNOSIS — R232 Flushing: Secondary | ICD-10-CM

## 2020-04-30 DIAGNOSIS — B351 Tinea unguium: Secondary | ICD-10-CM

## 2020-04-30 DIAGNOSIS — E039 Hypothyroidism, unspecified: Secondary | ICD-10-CM | POA: Diagnosis not present

## 2020-04-30 DIAGNOSIS — Z01419 Encounter for gynecological examination (general) (routine) without abnormal findings: Secondary | ICD-10-CM | POA: Diagnosis not present

## 2020-04-30 DIAGNOSIS — D509 Iron deficiency anemia, unspecified: Secondary | ICD-10-CM | POA: Diagnosis not present

## 2020-04-30 DIAGNOSIS — Z0000 Encounter for general adult medical examination without abnormal findings: Secondary | ICD-10-CM | POA: Diagnosis not present

## 2020-04-30 LAB — COMPREHENSIVE METABOLIC PANEL
ALT: 19 U/L (ref 0–35)
AST: 19 U/L (ref 0–37)
Albumin: 3.9 g/dL (ref 3.5–5.2)
Alkaline Phosphatase: 74 U/L (ref 39–117)
BUN: 12 mg/dL (ref 6–23)
CO2: 28 mEq/L (ref 19–32)
Calcium: 9.2 mg/dL (ref 8.4–10.5)
Chloride: 102 mEq/L (ref 96–112)
Creatinine, Ser: 0.58 mg/dL (ref 0.40–1.20)
GFR: 108.65 mL/min (ref >60.00)
Glucose, Bld: 91 mg/dL (ref 70–99)
Potassium: 4.5 mEq/L (ref 3.5–5.1)
Sodium: 137 mEq/L (ref 135–145)
Total Bilirubin: 0.3 mg/dL (ref 0.2–1.2)
Total Protein: 7.3 g/dL (ref 6.0–8.3)

## 2020-04-30 LAB — CBC WITH DIFFERENTIAL/PLATELET
Basophils Absolute: 0.1 10*3/uL (ref 0.0–0.1)
Basophils Relative: 1 % (ref 0.0–3.0)
Eosinophils Absolute: 0 10*3/uL (ref 0.0–0.7)
Eosinophils Relative: 0.6 % (ref 0.0–5.0)
HCT: 35.8 % — ABNORMAL LOW (ref 36.0–46.0)
Hemoglobin: 11.3 g/dL — ABNORMAL LOW (ref 12.0–15.0)
Lymphocytes Relative: 24.7 % (ref 12.0–46.0)
Lymphs Abs: 1.7 10*3/uL (ref 0.7–4.0)
MCHC: 31.7 g/dL (ref 30.0–36.0)
MCV: 85.7 fl (ref 78.0–100.0)
Monocytes Absolute: 0.7 10*3/uL (ref 0.1–1.0)
Monocytes Relative: 9.8 % (ref 3.0–12.0)
Neutro Abs: 4.3 10*3/uL (ref 1.4–7.7)
Neutrophils Relative %: 63.9 % (ref 43.0–77.0)
Platelets: 379 10*3/uL (ref 150.0–400.0)
RBC: 4.17 Mil/uL (ref 3.87–5.11)
RDW: 14.8 % (ref 11.5–15.5)
WBC: 6.8 10*3/uL (ref 4.0–10.5)

## 2020-04-30 LAB — IRON: Iron: 45 ug/dL (ref 42–145)

## 2020-04-30 LAB — ESTRADIOL: Estradiol: 193 pg/mL

## 2020-04-30 LAB — FERRITIN: Ferritin: 4.5 ng/mL — ABNORMAL LOW (ref 10.0–291.0)

## 2020-04-30 LAB — FOLLICLE STIMULATING HORMONE: FSH: 7.5 m[IU]/mL

## 2020-04-30 LAB — LUTEINIZING HORMONE: LH: 15.66 m[IU]/mL

## 2020-04-30 LAB — TSH: TSH: 0.57 u[IU]/mL (ref 0.35–4.50)

## 2020-04-30 MED ORDER — TERBINAFINE HCL 250 MG PO TABS
250.0000 mg | ORAL_TABLET | Freq: Every day | ORAL | 0 refills | Status: DC
  Filled 2020-04-30: qty 30, 30d supply, fill #0

## 2020-04-30 NOTE — Patient Instructions (Addendum)
Start lamisil 250 mg once daily.   Please complete lab work prior to leaving.

## 2020-04-30 NOTE — Progress Notes (Signed)
Subjective:   By signing my name below, I, [name], attest that this documentation has been prepared under the direction and in the presence of Debbrah Alar, NP. 04/30/2020   Patient ID: Cheyenne Hawkins, female    DOB: 09/08/1975, 45 y.o.   MRN: [id number]  No chief complaint on file.   HPI Patient is in today for a comprehensive physical exam. She is complaining discoloration of the first digit on both feet. She is also concerned about her athletes foot and thickening of the toenails. She denies any diarrhea, burning, frequency, and hematuria at this time. She notes that symptoms from her cervical radiculopathy has improved following prednisone.     Depression- She is taking 150 mg Wellbutrin daily PO to help manage her depression symptoms. She reports that she still has issues sleeping and must take melatonin to assist in falling asleep.  Menstrual cycle- Her menstrual cycles wax and wanes. She reports her last cycle was heavy. She also notes her breasts swell during her menstrual cycles.  Immunizations: She has had 2 covid vaccinations. She has not taken the 3rd Covid 19 booster vaccine and is not willing to at this time. She is UTD on tetanus vaccine. She reports she takes her yearly flu vaccine. Diet: She notes she occasionally does intermittent fasting. She is willing to improve her eating choices.  Exercise: Her exercise has improved from baseline. She participates in moderate exercise daily by walking her dogs 2-3 times a day. Colonoscopy: Last completed 05/07/2008. Melanosis found, otherwise normal exam. Repeat in 10 years. Dexa: Not yet completed. Pap Smear: Last completed 05/30/2017. Repeat in 3 years.  Mammogram: Last completed 12/23/2017. Results normal. Repeat in one year. Dental: She is UTD on dental. Vision: She is UTD on vision.  Past Medical History:  Diagnosis Date  . Anemia, iron deficiency 11/19/2011  . Depression 2012  . Fatty liver 03/21/2011  .  GERD (gastroesophageal reflux disease)   . Grave's disease 2002   s/p thyroidectomy  . Heart murmur   . History of chicken pox   . HSV-2 (herpes simplex virus 2) infection     Past Surgical History:  Procedure Laterality Date  . CESAREAN SECTION  2001  . CESAREAN SECTION  2003  . CESAREAN SECTION  2007  . THYROIDECTOMY  2003   for graves disease  . TUBAL LIGATION      Family History  Problem Relation Age of Onset  . Alcohol abuse Mother   . Depression Mother   . Bipolar disorder Mother   . Alcohol abuse Father   . Diabetes Father   . Hypertension Father   . Cancer Maternal Grandmother        colon  . Depression Maternal Grandmother   . Diabetes Maternal Grandmother   . Hypertension Maternal Grandmother   . Cancer Paternal Grandmother        breast  . Diabetes Paternal Grandmother   . Hypertension Paternal Grandmother     Social History   Socioeconomic History  . Marital status: Married    Spouse name: Not on file  . Number of children: 3  . Years of education: Not on file  . Highest education level: Not on file  Occupational History    Employer: [employer]  Tobacco Use  . Smoking status: Never Smoker  . Smokeless tobacco: Never Used  Vaping Use  . Vaping Use: Never used  Substance and Sexual Activity  . Alcohol use: Yes    Alcohol/week: 10.0  standard drinks    Types: 10 Glasses of wine per week  . Drug use: No  . Sexual activity: Yes    Birth control/protection: Surgical    Comment: btl  Other Topics Concern  . Not on file  Social History Narrative   Regular exercise:  No   Caffeine Use: 2 drinks weekly         Social Determinants of Health   Financial Resource Strain: Not on file  Food Insecurity: Not on file  Transportation Needs: Not on file  Physical Activity: Not on file  Stress: Not on file  Social Connections: Not on file  Intimate Partner Violence: Not on file    Outpatient Medications Prior to Visit  Medication Sig Dispense  Refill  . amLODipine (NORVASC) 2.5 MG tablet TAKE 1 TABLET (2.5 MG TOTAL) BY MOUTH DAILY. 90 tablet 1  . aspirin EC 81 MG tablet Take 1 tablet (81 mg total) by mouth daily. 90 tablet 3  . buPROPion (WELLBUTRIN XL) 150 MG 24 hr tablet TAKE 1 TABLET (150 MG TOTAL) BY MOUTH DAILY. 90 tablet 1  . diclofenac (VOLTAREN) 75 MG EC tablet TAKE 1 TABLET BY MOUTH TWICE A DAY WITH MEALS 60 tablet 3  . escitalopram (LEXAPRO) 20 MG tablet TAKE 1 TABLET BY MOUTH ONCE DAILY 90 tablet 1  . levothyroxine (SYNTHROID) 150 MCG tablet TAKE 1 TABLET (150 MCG TOTAL) BY MOUTH DAILY. 90 tablet 1  . Multiple Vitamins-Minerals (MULTIVITAMIN WITH MINERALS) tablet Take 1 tablet by mouth daily.    . predniSONE (DELTASONE) 10 MG tablet TAKE 6 TABLETS BY MOUTH DAILY FOR 4 DAYS, 4 TABS DAILY FOR 4 DAYS, THEN 2 TABS DAILY FOR 4 DAYS 48 tablet 0  . valACYclovir (VALTREX) 500 MG tablet TAKE 1 TABLET (500 MG TOTAL) BY MOUTH DAILY AS NEEDED. 30 tablet 2   No facility-administered medications prior to visit.    No Known Allergies  Review of Systems  Gastrointestinal: Negative for diarrhea.  Genitourinary: Negative for dysuria, frequency and hematuria.       Objective:    Physical Exam Exam conducted with a chaperone present.  Constitutional:      Appearance: Normal appearance.  HENT:     Head: Normocephalic and atraumatic.     Right Ear: Tympanic membrane and external ear normal.     Left Ear: Tympanic membrane and external ear normal.  Eyes:     Extraocular Movements: Extraocular movements intact.     Pupils: Pupils are equal, round, and reactive to light.     Comments: No nystagmus  Cardiovascular:     Rate and Rhythm: Normal rate and regular rhythm.     Heart sounds: No murmur heard.   Pulmonary:     Effort: Pulmonary effort is normal.     Breath sounds: Normal breath sounds.  Chest:  Breasts:     Right: Normal. No mass.     Left: Normal. No mass.    Abdominal:     General: Bowel sounds are normal.  There is no distension.     Palpations: Abdomen is soft.  Genitourinary:    General: Normal vulva.     Labia:        Right: No rash.        Left: No rash.      Cervix: No lesion or cervical bleeding.     Uterus: Normal.      Adnexa: Right adnexa normal and left adnexa normal.     Comments: Cervical os was  very anterior making pap difficult Musculoskeletal:     Comments: 5/5 strength on upper and lower extremities.  Skin:    General: Skin is warm and dry.  Neurological:     General: No focal deficit present.     Mental Status: She is alert and oriented to person, place, and time.  Psychiatric:        Mood and Affect: Mood normal.        Behavior: Behavior normal.     There were no vitals taken for this visit. Wt Readings from Last 3 Encounters:  03/18/20 193 lb 9.6 oz (87.8 kg)  07/11/19 194 lb (88 kg)  03/09/19 192 lb 6.4 oz (87.3 kg)    Diabetic Foot Exam - Simple   No data filed    Lab Results  Component Value Date   WBC 7.9 09/01/2018   HGB 11.2 (L) 09/01/2018   HCT 35.1 09/01/2018   PLT 365 09/01/2018   GLUCOSE 126 (H) 03/19/2019   CHOL 175 09/01/2018   TRIG 67 09/01/2018   HDL 75 09/01/2018   LDLCALC 85 09/01/2018   ALT 14 09/01/2018   AST 18 09/01/2018   NA 135 03/19/2019   K 3.9 03/19/2019   CL 100 03/19/2019   CREATININE 0.53 03/19/2019   BUN 8 03/19/2019   CO2 28 03/19/2019   TSH 0.50 05/18/2019   HGBA1C 5.9 05/18/2019    Lab Results  Component Value Date   TSH 0.50 05/18/2019   Lab Results  Component Value Date   WBC 7.9 09/01/2018   HGB 11.2 (L) 09/01/2018   HCT 35.1 09/01/2018   MCV 83.2 09/01/2018   PLT 365 09/01/2018   Lab Results  Component Value Date   NA 135 03/19/2019   K 3.9 03/19/2019   CO2 28 03/19/2019   GLUCOSE 126 (H) 03/19/2019   BUN 8 03/19/2019   CREATININE 0.53 03/19/2019   BILITOT 0.4 09/01/2018   ALKPHOS 70 05/30/2017   AST 18 09/01/2018   ALT 14 09/01/2018   PROT 7.5 09/01/2018   ALBUMIN 4.0 05/30/2017    CALCIUM 9.4 03/19/2019   GFR 150.85 03/19/2019   Lab Results  Component Value Date   CHOL 175 09/01/2018   Lab Results  Component Value Date   HDL 75 09/01/2018   Lab Results  Component Value Date   LDLCALC 85 09/01/2018   Lab Results  Component Value Date   TRIG 67 09/01/2018   Lab Results  Component Value Date   CHOLHDL 2.3 09/01/2018   Lab Results  Component Value Date   HGBA1C 5.9 05/18/2019       Assessment & Plan:   Problem List Items Addressed This Visit   None      No orders of the defined types were placed in this encounter.  Onychomycosis- rx with lamisil, recheck lft in 4 weeks.  Iron deficiency anemia- check cbc, iron studies.  Hypothyroid- check TSH.  I, [name], personally preformed the services described in this documentation.  All medical record entries made by the scribe were at my direction and in my presence.  I have reviewed the chart and discharge instructions (if applicable) and agree that the record reflects my personal performance and is accurate and complete.04/30/2020  I, Nance Pear, NP, have reviewed all documentation for this visit. The documentation on 05/01/20 for the exam, diagnosis, procedures, and orders are all accurate and complete.   I,[name],acting as a Education administrator for [employer], NP.,have documented all  relevant documentation on the behalf of Nance Pear, NP,as directed by  Nance Pear, NP while in the presence of Nance Pear, NP.    Shehryar [employer]

## 2020-05-01 ENCOUNTER — Encounter: Payer: Self-pay | Admitting: Gastroenterology

## 2020-05-01 DIAGNOSIS — B351 Tinea unguium: Secondary | ICD-10-CM | POA: Insufficient documentation

## 2020-05-01 MED ORDER — FERROUS SULFATE 325 (65 FE) MG PO TABS: 325.0000 mg | ORAL_TABLET | ORAL | 3 refills | Status: DC

## 2020-05-01 NOTE — Assessment & Plan Note (Signed)
Discussed healthy diet, exercise, weight loss. Refer for colonoscopy and mammogram.  Recommended covid booster.

## 2020-05-02 LAB — CYTOLOGY - PAP
Adequacy: ABSENT
Comment: NEGATIVE
Diagnosis: NEGATIVE
High risk HPV: NEGATIVE

## 2020-05-04 ENCOUNTER — Encounter: Payer: Self-pay | Admitting: Family

## 2020-05-14 ENCOUNTER — Other Ambulatory Visit (HOSPITAL_BASED_OUTPATIENT_CLINIC_OR_DEPARTMENT_OTHER): Payer: Self-pay

## 2020-05-16 ENCOUNTER — Other Ambulatory Visit: Payer: Self-pay

## 2020-05-16 ENCOUNTER — Encounter (HOSPITAL_BASED_OUTPATIENT_CLINIC_OR_DEPARTMENT_OTHER): Payer: Self-pay | Admitting: *Deleted

## 2020-05-16 ENCOUNTER — Emergency Department (HOSPITAL_BASED_OUTPATIENT_CLINIC_OR_DEPARTMENT_OTHER)
Admission: EM | Admit: 2020-05-16 | Discharge: 2020-05-16 | Disposition: A | Discharge status: Home / Self Care | Payer: 59 | Attending: Emergency Medicine | Admitting: Emergency Medicine

## 2020-05-16 DIAGNOSIS — H209 Unspecified iridocyclitis: Secondary | ICD-10-CM

## 2020-05-16 DIAGNOSIS — Z79899 Other long term (current) drug therapy: Secondary | ICD-10-CM | POA: Diagnosis not present

## 2020-05-16 DIAGNOSIS — H53149 Visual discomfort, unspecified: Secondary | ICD-10-CM | POA: Diagnosis not present

## 2020-05-16 DIAGNOSIS — E039 Hypothyroidism, unspecified: Secondary | ICD-10-CM | POA: Diagnosis not present

## 2020-05-16 DIAGNOSIS — Z7982 Long term (current) use of aspirin: Secondary | ICD-10-CM | POA: Diagnosis not present

## 2020-05-16 DIAGNOSIS — H20012 Primary iridocyclitis, left eye: Secondary | ICD-10-CM | POA: Diagnosis not present

## 2020-05-16 DIAGNOSIS — S0591XA Unspecified injury of right eye and orbit, initial encounter: Secondary | ICD-10-CM | POA: Diagnosis not present

## 2020-05-16 DIAGNOSIS — H5789 Other specified disorders of eye and adnexa: Secondary | ICD-10-CM | POA: Diagnosis present

## 2020-05-16 HISTORY — DX: Unspecified osteoarthritis, unspecified site: M19.90

## 2020-05-16 MED ORDER — TETRACAINE HCL 0.5 % OP SOLN
1.0000 [drp] | Freq: Once | OPHTHALMIC | Status: AC
  Administered 2020-05-16: 1 [drp] via OPHTHALMIC
  Filled 2020-05-16: qty 4

## 2020-05-16 MED ORDER — FLUORESCEIN SODIUM 1 MG OP STRP
1.0000 | ORAL_STRIP | Freq: Once | OPHTHALMIC | Status: AC
  Administered 2020-05-16: 1 via OPHTHALMIC
  Filled 2020-05-16: qty 1

## 2020-05-16 MED ORDER — CYCLOPENTOLATE HCL 1 % OP SOLN
1.0000 [drp] | Freq: Once | OPHTHALMIC | Status: AC
  Administered 2020-05-16: 1 [drp] via OPHTHALMIC
  Filled 2020-05-16: qty 2

## 2020-05-16 NOTE — Discharge Instructions (Signed)
Use the eye drop 3 times a day for the next 2-3 days and ibuprofen/tylenol for pain.  Follow up with eye doctor  on Monday if not better
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
## 2020-05-28 ENCOUNTER — Ambulatory Visit (HOSPITAL_BASED_OUTPATIENT_CLINIC_OR_DEPARTMENT_OTHER): Payer: 59 | Admitting: Radiology

## 2020-05-28 ENCOUNTER — Other Ambulatory Visit (INDEPENDENT_AMBULATORY_CARE_PROVIDER_SITE_OTHER): Payer: 59

## 2020-05-28 ENCOUNTER — Other Ambulatory Visit: Payer: Self-pay

## 2020-05-28 DIAGNOSIS — D509 Iron deficiency anemia, unspecified: Secondary | ICD-10-CM | POA: Diagnosis not present

## 2020-05-28 DIAGNOSIS — B351 Tinea unguium: Secondary | ICD-10-CM

## 2020-05-28 LAB — CBC WITH DIFFERENTIAL/PLATELET
Basophils Absolute: 0.1 10*3/uL (ref 0.0–0.1)
Basophils Relative: 0.7 % (ref 0.0–3.0)
Eosinophils Absolute: 0.1 10*3/uL (ref 0.0–0.7)
Eosinophils Relative: 1.1 % (ref 0.0–5.0)
HCT: 33.3 % — ABNORMAL LOW (ref 36.0–46.0)
Hemoglobin: 11.1 g/dL — ABNORMAL LOW (ref 12.0–15.0)
Lymphocytes Relative: 22.2 % (ref 12.0–46.0)
Lymphs Abs: 1.6 10*3/uL (ref 0.7–4.0)
MCHC: 33.2 g/dL (ref 30.0–36.0)
MCV: 83.8 fl (ref 78.0–100.0)
Monocytes Absolute: 0.5 10*3/uL (ref 0.1–1.0)
Monocytes Relative: 6.8 % (ref 3.0–12.0)
Neutro Abs: 4.9 10*3/uL (ref 1.4–7.7)
Neutrophils Relative %: 69.2 % (ref 43.0–77.0)
Platelets: 385 10*3/uL (ref 150.0–400.0)
RBC: 3.97 Mil/uL (ref 3.87–5.11)
RDW: 14.7 % (ref 11.5–15.5)
WBC: 7.1 10*3/uL (ref 4.0–10.5)

## 2020-05-28 LAB — IRON: Iron: 81 ug/dL (ref 42–145)

## 2020-05-28 LAB — HEPATIC FUNCTION PANEL
ALT: 16 U/L (ref 0–35)
AST: 18 U/L (ref 0–37)
Albumin: 4.3 g/dL (ref 3.5–5.2)
Alkaline Phosphatase: 75 U/L (ref 39–117)
Bilirubin, Direct: 0.1 mg/dL (ref 0.0–0.3)
Total Bilirubin: 0.4 mg/dL (ref 0.2–1.2)
Total Protein: 7.5 g/dL (ref 6.0–8.3)

## 2020-05-30 ENCOUNTER — Other Ambulatory Visit (HOSPITAL_BASED_OUTPATIENT_CLINIC_OR_DEPARTMENT_OTHER): Payer: Self-pay

## 2020-05-30 ENCOUNTER — Ambulatory Visit (AMBULATORY_SURGERY_CENTER): Payer: Self-pay | Admitting: *Deleted

## 2020-05-30 ENCOUNTER — Other Ambulatory Visit: Payer: Self-pay

## 2020-05-30 VITALS — Ht 64.0 in | Wt 195.0 lb

## 2020-05-30 DIAGNOSIS — Z1211 Encounter for screening for malignant neoplasm of colon: Secondary | ICD-10-CM

## 2020-05-30 LAB — IRON AND TIBC
Iron Saturation: 18 % (ref 15–55)
Iron: 76 ug/dL (ref 27–159)
Total Iron Binding Capacity: 428 ug/dL (ref 250–450)
UIBC: 352 ug/dL (ref 131–425)

## 2020-05-30 MED ORDER — SUPREP BOWEL PREP KIT 17.5-3.13-1.6 GM/177ML PO SOLN
1.0000 | Freq: Once | ORAL | 0 refills | Status: AC
Start: 2020-05-30 — End: 2020-06-21
  Filled 2020-05-30 – 2020-06-20 (×2): qty 354, 1d supply, fill #0

## 2020-05-30 NOTE — Progress Notes (Signed)
No egg or soy allergy known to patient  No issues with past sedation with any surgeries or procedures Patient denies ever being told they had issues or difficulty with intubation  No FH of Malignant Hyperthermia No diet pills per patient No home 02 use per patient  No blood thinners per patient  Pt denies issues with constipation - if she takes the IRON gets very constipated- typically soft regular BM's  No A fib or A flutter  EMMI video to pt or via [place] 19 guidelines implemented in [place] today with Pt and RN  Pt is fully vaccinated  for Covid   Due to the COVID-19 pandemic we are asking patients to follow certain guidelines.  Pt aware of COVID protocols and [facility] guidelines

## 2020-06-06 ENCOUNTER — Other Ambulatory Visit (HOSPITAL_BASED_OUTPATIENT_CLINIC_OR_DEPARTMENT_OTHER): Payer: Self-pay

## 2020-06-20 ENCOUNTER — Other Ambulatory Visit (HOSPITAL_BASED_OUTPATIENT_CLINIC_OR_DEPARTMENT_OTHER): Payer: Self-pay

## 2020-06-23 ENCOUNTER — Encounter: Payer: Self-pay | Admitting: Certified Registered Nurse Anesthetist

## 2020-06-24 ENCOUNTER — Ambulatory Visit (AMBULATORY_SURGERY_CENTER): Payer: 59 | Admitting: Gastroenterology

## 2020-06-24 ENCOUNTER — Other Ambulatory Visit: Payer: Self-pay

## 2020-06-24 ENCOUNTER — Encounter: Payer: Self-pay | Admitting: Gastroenterology

## 2020-06-24 VITALS — BP 120/83 | HR 70 | Temp 96.6°F | Resp 21 | Ht 64.0 in | Wt 195.0 lb

## 2020-06-24 DIAGNOSIS — Z1211 Encounter for screening for malignant neoplasm of colon: Secondary | ICD-10-CM

## 2020-06-24 DIAGNOSIS — I4891 Unspecified atrial fibrillation: Secondary | ICD-10-CM | POA: Diagnosis not present

## 2020-06-24 DIAGNOSIS — Z86010 Personal history of colonic polyps: Secondary | ICD-10-CM | POA: Diagnosis not present

## 2020-06-24 DIAGNOSIS — D123 Benign neoplasm of transverse colon: Secondary | ICD-10-CM

## 2020-06-24 DIAGNOSIS — K5730 Diverticulosis of large intestine without perforation or abscess without bleeding: Secondary | ICD-10-CM

## 2020-06-24 DIAGNOSIS — Z800 Family history of malignant neoplasm of digestive organs: Secondary | ICD-10-CM

## 2020-06-24 DIAGNOSIS — I10 Essential (primary) hypertension: Secondary | ICD-10-CM | POA: Diagnosis not present

## 2020-06-24 MED ORDER — SODIUM CHLORIDE 0.9 % IV SOLN: 500.0000 mL | Freq: Once | INTRAVENOUS | Status: DC

## 2020-06-24 NOTE — Op Note (Signed)
[facility] Patient Name: Cheyenne Hawkins Procedure Date: 06/24/2020 10:36 AM MRN: [id number] Endoscopist: Gerrit Heck , MD Age: 45 Referring MD:  Date of Birth: 30-Jun-1975 Gender: Female Account #: [id number] Procedure:                Colonoscopy Indications:              Screening for colorectal malignant neoplasm (last                            colonoscopy was more than 10 years ago)                           Last colonoscopy was 04/2008 and no polyps.                           Family history notable for maternal grandmother                            with CRC. Otherwise, no active GI symptoms. Medicines:                Monitored Anesthesia Care Procedure:                Pre-Anesthesia Assessment:                           - Prior to the procedure, a History and Physical                            was performed, and patient medications and                            allergies were reviewed. The patient's tolerance of                            previous anesthesia was also reviewed. The risks                            and benefits of the procedure and the sedation                            options and risks were discussed with the patient.                            All questions were answered, and informed consent                            was obtained. Prior Anticoagulants: The patient has                            taken no previous anticoagulant or antiplatelet                            agents. ASA Grade Assessment: II - A patient with  mild systemic disease. After reviewing the risks                            and benefits, the patient was deemed in                            satisfactory condition to undergo the procedure.                           After obtaining informed consent, the colonoscope                            was passed under direct vision. Throughout the                            procedure, the patient's blood  pressure, pulse, and                            oxygen saturations were monitored continuously. The                            Olympus CF-HQ190 [id number]) Colonoscope was                            introduced through the anus and advanced to the the                            cecum, identified by appendiceal orifice and                            ileocecal valve. The colonoscopy was performed                            without difficulty. The patient tolerated the                            procedure well. The quality of the bowel                            preparation was good. The ileocecal valve,                            appendiceal orifice, and rectum were photographed. Scope In: 10:45:20 AM Scope Out: 11:00:04 AM Scope Withdrawal Time: 0 hours 12 minutes 8 seconds  Total Procedure Duration: 0 hours 14 minutes 44 seconds  Findings:                 The perianal and digital rectal examinations were                            normal.                           A 3 mm polyp was found in the transverse colon. The  polyp was sessile. The polyp was removed with a                            cold snare. Resection and retrieval were complete.                            Estimated blood loss was minimal.                           A few small-mouthed diverticula were found in the                            sigmoid colon.                           The retroflexed view of the distal rectum and anal                            verge was normal and showed no anal or rectal                            abnormalities. Complications:            No immediate complications. Estimated Blood Loss:     Estimated blood loss was minimal. Impression:               - One 3 mm polyp in the transverse colon, removed                            with a cold snare. Resected and retrieved.                           - Diverticulosis in the sigmoid colon.                           - The distal  rectum and anal verge are normal on                            retroflexion view. Recommendation:           - Patient has a contact number available for                            emergencies. The signs and symptoms of potential                            delayed complications were discussed with the                            patient. Return to normal activities tomorrow.                            Written discharge instructions were provided to the  patient.                           - Resume previous diet.                           - Continue present medications.                           - Await pathology results.                           - Repeat colonoscopy for surveillance based on                            pathology results.                           - Return to GI office PRN. Gerrit Heck, MD 06/24/2020 11:09:13 AM

## 2020-06-24 NOTE — Patient Instructions (Signed)
Handouts given:  diverticulosis, polyps Resume previous diet Continue current medications Await pathology results  YOU HAD AN ENDOSCOPIC PROCEDURE TODAY AT THE [facility]:   Refer to the procedure report that was given to you for any specific questions about what was found during the examination.  If the procedure report does not answer your questions, please call your gastroenterologist to clarify.  If you requested that your care partner not be given the details of your procedure findings, then the procedure report has been included in a sealed envelope for you to review at your convenience later.  YOU SHOULD EXPECT: Some feelings of bloating in the abdomen. Passage of more gas than usual.  Walking can help get rid of the air that was put into your GI tract during the procedure and reduce the bloating. If you had a lower endoscopy (such as a colonoscopy or flexible sigmoidoscopy) you may notice spotting of blood in your stool or on the toilet paper. If you underwent a bowel prep for your procedure, you may not have a normal bowel movement for a few days.  Please Note:  You might notice some irritation and congestion in your nose or some drainage.  This is from the oxygen used during your procedure.  There is no need for concern and it should clear up in a day or so.  SYMPTOMS TO REPORT IMMEDIATELY:  Following lower endoscopy (colonoscopy or flexible sigmoidoscopy):  Excessive amounts of blood in the stool  Significant tenderness or worsening of abdominal pains  Swelling of the abdomen that is new, acute  Fever of 100F or higher  For urgent or emergent issues, a gastroenterologist can be reached at any hour by calling [phone]. Do not use MyChart messaging for urgent concerns.   DIET:  We do recommend a small meal at first, but then you may proceed to your regular diet.  Drink plenty of fluids but you should avoid alcoholic beverages for 24 hours.  ACTIVITY:  You should  plan to take it easy for the rest of today and you should NOT DRIVE or use heavy machinery until tomorrow (because of the sedation medicines used during the test).    FOLLOW UP: Our staff will call the number listed on your records 48-72 hours following your procedure to check on you and address any questions or concerns that you may have regarding the information given to you following your procedure. If we do not reach you, we will leave a message.  We will attempt to reach you two times.  During this call, we will ask if you have developed any symptoms of COVID 19. If you develop any symptoms (ie: fever, flu-like symptoms, shortness of breath, cough etc.) before then, please call [phone].  If you test positive for Covid 19 in the 2 weeks post procedure, please call and report this information to us.    If any biopsies were taken you will be contacted by phone or by letter within the next 1-3 weeks.  Please call us at [phone] if you have not heard about the biopsies in 3 weeks.   SIGNATURES/CONFIDENTIALITY: You and/or your care partner have signed paperwork which will be entered into your electronic medical record.  These signatures attest to the fact that that the information above on your After Visit Summary has been reviewed and is understood.  Full responsibility of the confidentiality of this discharge information lies with you and/or your care-partner.  

## 2020-06-24 NOTE — Progress Notes (Signed)
Vitals-CW  Pt's states no medical or surgical changes since previsit or office visit. 

## 2020-06-24 NOTE — Progress Notes (Signed)
Called to room to assist during endoscopic procedure.  Patient ID and intended procedure confirmed with present staff. Received instructions for my participation in the procedure from the performing physician.  

## 2020-06-26 ENCOUNTER — Telehealth: Payer: Self-pay | Admitting: *Deleted

## 2020-06-26 NOTE — Telephone Encounter (Signed)
Patient calling back stating she is doing well.

## 2020-06-26 NOTE — Telephone Encounter (Signed)
  Follow up Call-  Call back number 06/24/2020  Post procedure Call Back phone  # [phone]  Permission to leave phone message Yes  Some recent data might be hidden     Patient questions:  Do you have a fever, pain , or abdominal swelling? No. Pain Score  0 *  Have you tolerated food without any problems? Yes.    Have you been able to return to your normal activities? Yes.    Do you have any questions about your discharge instructions: Diet   No. Medications  No. Follow up visit  No.  Do you have questions or concerns about your Care? No.  Actions: * If pain score is 4 or above: No action needed, pain <4.  Have you developed a fever since your procedure? no  2.   Have you had an respiratory symptoms (SOB or cough) since your procedure? no  3.   Have you tested positive for COVID 19 since your procedure no  4.   Have you had any family members/close contacts diagnosed with the COVID 19 since your procedure?  no   If yes to any of these questions please route to Joylene John, RN and Joella Prince, RN

## 2020-06-26 NOTE — Telephone Encounter (Signed)
  Follow up Call-  Call back number 06/24/2020  Post procedure Call Back phone  # [phone]  Permission to leave phone message Yes  Some recent data might be hidden     Patient questions: Message left to call if necessary.

## 2020-06-27 ENCOUNTER — Encounter: Payer: Self-pay | Admitting: Gastroenterology

## 2020-07-30 ENCOUNTER — Ambulatory Visit: Payer: 59 | Admitting: Family

## 2020-07-30 NOTE — Progress Notes (Incomplete)
Subjective:   By signing my name below, I, [name], attest that this documentation has been prepared under the direction and in the presence of Debbrah Alar NP. 07/30/2020   Patient ID: Cheyenne Hawkins, female    DOB: Jun 11, 1975, 45 y.o.   MRN: [id number]  No chief complaint on file.   HPI Patient is in today for an office visit.   Anemia: Depression: GERD: Hypertension:   Health Maintenance Due  Topic Date Due   Hepatitis C Screening  Never done   COVID-19 Vaccine (3 - Booster for Pfizer series) 07/26/2019    Past Medical History:  Diagnosis Date   Anemia, iron deficiency 11/19/2011   Arthritis    Atrial fibrillation ([place])    with Graves disease    Blood transfusion without reported diagnosis    Depression 2012   Fatty liver 03/21/2011   GERD (gastroesophageal reflux disease)    Grave's disease 2002   s/p thyroidectomy   Heart murmur    MVP   History of chicken pox    HSV-2 (herpes simplex virus 2) infection    Hypertension     Past Surgical History:  Procedure Laterality Date   CESAREAN SECTION  2001   CESAREAN SECTION  2003   CESAREAN SECTION  2007   COLONOSCOPY  2010   [place]  2003   for graves disease   TUBAL LIGATION      Family History  Problem Relation Age of Onset   Alcohol abuse Mother    Depression Mother    Bipolar disorder Mother    Alcohol abuse Father    Diabetes Father    Hypertension Father    Colon polyps Father    Cancer Maternal Grandmother        colon   Depression Maternal Grandmother    Diabetes Maternal Grandmother    Hypertension Maternal Grandmother    Colon cancer Maternal Grandmother    Colon polyps Maternal Grandmother    Cancer Paternal Grandmother        breast   Diabetes Paternal Grandmother    Hypertension Paternal Grandmother    Esophageal cancer Neg Hx    Rectal cancer Neg Hx    Stomach cancer Neg Hx     Social History   Socioeconomic History   Marital status: Married     Spouse name: Not on file   Number of children: 3   Years of education: Not on file   Highest education level: Not on file  Occupational History    Employer: [employer]  Tobacco Use   Smoking status: Never   Smokeless tobacco: Never  Vaping Use   Vaping Use: Never used  Substance and Sexual Activity   Alcohol use: Yes    Alcohol/week: 10.0 standard drinks    Types: 10 Glasses of wine per week   Drug use: No   Sexual activity: Yes    Birth control/protection: Surgical    Comment: btl  Other Topics Concern   Not on file  Social History Narrative   Regular exercise:  No   Caffeine Use: 2 drinks weekly         Social Determinants of Health   Financial Resource Strain: Not on file  Food Insecurity: Not on file  Transportation Needs: Not on file  Physical Activity: Not on file  Stress: Not on file  Social Connections: Not on file  Intimate Partner Violence: Not on file    Outpatient Medications Prior to  Visit  Medication Sig Dispense Refill   amLODipine (NORVASC) 2.5 MG tablet TAKE 1 TABLET (2.5 MG TOTAL) BY MOUTH DAILY. 90 tablet 1   aspirin EC 81 MG tablet Take 1 tablet (81 mg total) by mouth daily. 90 tablet 3   buPROPion (WELLBUTRIN XL) 150 MG 24 hr tablet TAKE 1 TABLET (150 MG TOTAL) BY MOUTH DAILY. 90 tablet 1   diclofenac (VOLTAREN) 75 MG EC tablet TAKE 1 TABLET BY MOUTH TWICE A DAY WITH MEALS (Patient not taking: No sig reported) 60 tablet 3   escitalopram (LEXAPRO) 20 MG tablet TAKE 1 TABLET BY MOUTH ONCE DAILY 90 tablet 1   ferrous sulfate 325 (65 FE) MG tablet Take 1 tablet (325 mg total) by mouth every other day. (Patient not taking: No sig reported)  3   levothyroxine (SYNTHROID) 150 MCG tablet TAKE 1 TABLET (150 MCG TOTAL) BY MOUTH DAILY. 90 tablet 1   Multiple Vitamins-Minerals (MULTIVITAMIN WITH MINERALS) tablet Take 1 tablet by mouth daily.     valACYclovir (VALTREX) 500 MG tablet TAKE 1 TABLET (500 MG TOTAL) BY MOUTH DAILY AS NEEDED. 30 tablet 2   No  facility-administered medications prior to visit.    No Known Allergies  ROS     Objective:    Physical Exam Constitutional:      General: She is not in acute distress.    Appearance: Normal appearance. She is not ill-appearing.  HENT:     Head: Normocephalic and atraumatic.     Right Ear: External ear normal.     Left Ear: External ear normal.  Eyes:     Extraocular Movements: Extraocular movements intact.     Pupils: Pupils are equal, round, and reactive to light.  Cardiovascular:     Rate and Rhythm: Normal rate and regular rhythm.     Pulses: Normal pulses.     Heart sounds: Normal heart sounds. No murmur heard.   No gallop.  Pulmonary:     Effort: Pulmonary effort is normal. No respiratory distress.     Breath sounds: Normal breath sounds. No wheezing, rhonchi or rales.  Skin:    General: Skin is warm and dry.  Neurological:     Mental Status: She is alert and oriented to person, place, and time.  Psychiatric:        Behavior: Behavior normal.        Judgment: Judgment normal.    There were no vitals taken for this visit. Wt Readings from Last 3 Encounters:  06/24/20 195 lb (88.5 kg)  05/30/20 195 lb (88.5 kg)  05/16/20 193 lb (87.5 kg)       Assessment & Plan:   Problem List Items Addressed This Visit   None    No orders of the defined types were placed in this encounter.   I, Debbrah Alar NP. , personally preformed the services described in this documentation.  All medical record entries made by the scribe were at my direction and in my presence.  I have reviewed the chart and discharge instructions (if applicable) and agree that the record reflects my personal performance and is accurate and complete. 07/30/2020   I,[name],acting as a Education administrator for Nance Pear, NP.,have documented all relevant documentation on the behalf of Nance Pear, NP,as directed by  Nance Pear, NP while in the presence of Nance Pear, NP.   Shehryar [employer]

## 2020-08-07 ENCOUNTER — Other Ambulatory Visit: Payer: Self-pay | Admitting: Family

## 2020-08-07 ENCOUNTER — Other Ambulatory Visit (HOSPITAL_BASED_OUTPATIENT_CLINIC_OR_DEPARTMENT_OTHER): Payer: Self-pay

## 2020-08-07 MED ORDER — AMLODIPINE BESYLATE 2.5 MG PO TABS
ORAL_TABLET | Freq: Every day | ORAL | 0 refills | Status: DC
Start: 2020-08-07 — End: 2021-02-09
  Filled 2020-08-07 – 2020-08-15 (×2): qty 90, 90d supply, fill #0

## 2020-08-07 MED FILL — Bupropion HCl Tab ER 24HR 150 MG: ORAL | 90 days supply | Qty: 90 | Fill #0 | Status: CN

## 2020-08-14 ENCOUNTER — Other Ambulatory Visit (HOSPITAL_BASED_OUTPATIENT_CLINIC_OR_DEPARTMENT_OTHER): Payer: Self-pay

## 2020-08-14 ENCOUNTER — Encounter: Payer: Self-pay | Admitting: Family

## 2020-08-14 ENCOUNTER — Other Ambulatory Visit: Payer: Self-pay | Admitting: Family

## 2020-08-14 MED ORDER — ESCITALOPRAM OXALATE 20 MG PO TABS
ORAL_TABLET | Freq: Every day | ORAL | 1 refills | Status: DC
  Filled 2020-08-14: qty 90, 90d supply, fill #0
  Filled 2021-02-09: qty 90, 90d supply, fill #1

## 2020-08-15 ENCOUNTER — Telehealth (INDEPENDENT_AMBULATORY_CARE_PROVIDER_SITE_OTHER): Payer: 59 | Admitting: Family

## 2020-08-15 ENCOUNTER — Other Ambulatory Visit: Payer: Self-pay

## 2020-08-15 ENCOUNTER — Other Ambulatory Visit (HOSPITAL_BASED_OUTPATIENT_CLINIC_OR_DEPARTMENT_OTHER): Payer: Self-pay

## 2020-08-15 VITALS — BP 117/81 | HR 83

## 2020-08-15 DIAGNOSIS — U071 COVID-19: Secondary | ICD-10-CM | POA: Insufficient documentation

## 2020-08-15 HISTORY — DX: COVID-19: U07.1

## 2020-08-15 MED ORDER — MOLNUPIRAVIR 200 MG PO CAPS
800.0000 mg | ORAL_CAPSULE | Freq: Two times a day (BID) | ORAL | 0 refills | Status: AC
  Filled 2020-08-15: qty 40, 5d supply, fill #0

## 2020-08-15 MED FILL — Bupropion HCl Tab ER 24HR 150 MG: ORAL | 90 days supply | Qty: 90 | Fill #0 | Status: AC

## 2020-08-15 NOTE — Assessment & Plan Note (Signed)
Will rx with molnupiravir. Advised of CDC guidelines for self isolation/ ending isolation.  Advised of safe practice guidelines. Symptom Tier reviewed.  Encouraged to monitor for any worsening symptoms; watch for increased shortness of breath, weakness, and signs of dehydration. Advised when to seek emergency care.  Instructed to rest and hydrate well.  Advised to leave the house during recommended isolation period, only if it is necessary to seek medical care Pt verbalizes understanding.

## 2020-08-15 NOTE — Progress Notes (Signed)
MyChart Video Visit    Virtual Visit via Video Note   This visit type was conducted due to national recommendations for restrictions regarding the COVID-19 Pandemic (e.g. social distancing) in an effort to limit this patient's exposure and mitigate transmission in our community. This patient is at least at moderate risk for complications without adequate follow up. This format is felt to be most appropriate for this patient at this time. Physical exam was limited by quality of the video and audio technology used for the visit. CMA was able to get the patient set up on a video visit.  Patient location: Home Patient and provider in visit Provider location: Office  I discussed the limitations of evaluation and management by telemedicine and the availability of in person appointments. The patient expressed understanding and agreed to proceed.  Visit Date: 08/15/2020  Today's healthcare provider: Nance Pear, NP     Subjective:    Patient ID: Cheyenne Hawkins, female    DOB: Jan 30, 1975, 45 y.o.   MRN: [id number]  Chief Complaint  Patient presents with   Covid Positive    Patient tested positive 08-13-20   Covid Symptoms    Patient reports symptoms started 8-02. Complains of congestion, runny nose and lower back pain    HPI Patient is in today for a video visit. Symptoms started Tuesday Morning 2nd.  Today she has subjective low grade fever, nasal congestion, low back pain, cough.  Had fatigue in the beginning, but now that is getting better.   Past Medical History:  Diagnosis Date   Anemia, iron deficiency 11/19/2011   Arthritis    Atrial fibrillation ([place])    with Graves disease    Blood transfusion without reported diagnosis    Depression 2012   Fatty liver 03/21/2011   GERD (gastroesophageal reflux disease)    Grave's disease 2002   s/p thyroidectomy   Heart murmur    MVP   History of chicken pox    HSV-2 (herpes simplex virus 2) infection    Hypertension      Past Surgical History:  Procedure Laterality Date   CESAREAN SECTION  2001   CESAREAN SECTION  2003   CESAREAN SECTION  2007   COLONOSCOPY  2010   [place]  2003   for graves disease   TUBAL LIGATION      Family History  Problem Relation Age of Onset   Alcohol abuse Mother    Depression Mother    Bipolar disorder Mother    Alcohol abuse Father    Diabetes Father    Hypertension Father    Colon polyps Father    Cancer Maternal Grandmother        colon   Depression Maternal Grandmother    Diabetes Maternal Grandmother    Hypertension Maternal Grandmother    Colon cancer Maternal Grandmother    Colon polyps Maternal Grandmother    Cancer Paternal Grandmother        breast   Diabetes Paternal Grandmother    Hypertension Paternal Grandmother    Esophageal cancer Neg Hx    Rectal cancer Neg Hx    Stomach cancer Neg Hx     Social History   Socioeconomic History   Marital status: Married    Spouse name: Not on file   Number of children: 3   Years of education: Not on file   Highest education level: Not on file  Occupational History    Employer: [employer]  Tobacco Use   Smoking status: Never   Smokeless tobacco: Never  Vaping Use   Vaping Use: Never used  Substance and Sexual Activity   Alcohol use: Yes    Alcohol/week: 10.0 standard drinks    Types: 10 Glasses of wine per week   Drug use: No   Sexual activity: Yes    Birth control/protection: Surgical    Comment: btl  Other Topics Concern   Not on file  Social History Narrative   Regular exercise:  No   Caffeine Use: 2 drinks weekly         Social Determinants of Health   Financial Resource Strain: Not on file  Food Insecurity: Not on file  Transportation Needs: Not on file  Physical Activity: Not on file  Stress: Not on file  Social Connections: Not on file  Intimate Partner Violence: Not on file    Outpatient Medications Prior to Visit  Medication Sig Dispense Refill    amLODipine (NORVASC) 2.5 MG tablet TAKE 1 TABLET (2.5 MG TOTAL) BY MOUTH DAILY. 90 tablet 0   aspirin EC 81 MG tablet Take 1 tablet (81 mg total) by mouth daily. 90 tablet 3   buPROPion (WELLBUTRIN XL) 150 MG 24 hr tablet TAKE 1 TABLET (150 MG TOTAL) BY MOUTH DAILY. 90 tablet 1   diclofenac (VOLTAREN) 75 MG EC tablet TAKE 1 TABLET BY MOUTH TWICE A DAY WITH MEALS 60 tablet 3   escitalopram (LEXAPRO) 20 MG tablet TAKE 1 TABLET BY MOUTH ONCE DAILY 90 tablet 1   ferrous sulfate 325 (65 FE) MG tablet Take 1 tablet (325 mg total) by mouth every other day.  3   levothyroxine (SYNTHROID) 150 MCG tablet TAKE 1 TABLET (150 MCG TOTAL) BY MOUTH DAILY. 90 tablet 1   Multiple Vitamins-Minerals (MULTIVITAMIN WITH MINERALS) tablet Take 1 tablet by mouth daily.     valACYclovir (VALTREX) 500 MG tablet TAKE 1 TABLET (500 MG TOTAL) BY MOUTH DAILY AS NEEDED. 30 tablet 2   No facility-administered medications prior to visit.    No Known Allergies  ROS    See HPI  Objective:    Physical Exam Constitutional:      Appearance: Normal appearance. She is well-developed.  Cardiovascular:     Rate and Rhythm: Normal rate and regular rhythm.     Heart sounds: Normal heart sounds. No murmur heard. Pulmonary:     Effort: Pulmonary effort is normal. No respiratory distress.     Breath sounds: Normal breath sounds. No wheezing.  Neurological:     Mental Status: She is alert.  Psychiatric:        Behavior: Behavior normal.        Thought Content: Thought content normal.        Judgment: Judgment normal.    BP 117/81   Pulse 83  Wt Readings from Last 3 Encounters:  06/24/20 195 lb (88.5 kg)  05/30/20 195 lb (88.5 kg)  05/16/20 193 lb (87.5 kg)    Diabetic Foot Exam - Simple   No data filed    Lab Results  Component Value Date   WBC 7.1 05/28/2020   HGB 11.1 (L) 05/28/2020   HCT 33.3 (L) 05/28/2020   PLT 385.0 05/28/2020   GLUCOSE 91 04/30/2020   CHOL 175 09/01/2018   TRIG 67 09/01/2018   HDL 75  09/01/2018   LDLCALC 85 09/01/2018   ALT 16 05/28/2020   AST 18 05/28/2020   NA 137 04/30/2020   K 4.5 04/30/2020  CL 102 04/30/2020   CREATININE 0.58 04/30/2020   BUN 12 04/30/2020   CO2 28 04/30/2020   TSH 0.57 04/30/2020   HGBA1C 5.9 05/18/2019    Lab Results  Component Value Date   TSH 0.57 04/30/2020   Lab Results  Component Value Date   WBC 7.1 05/28/2020   HGB 11.1 (L) 05/28/2020   HCT 33.3 (L) 05/28/2020   MCV 83.8 05/28/2020   PLT 385.0 05/28/2020   Lab Results  Component Value Date   NA 137 04/30/2020   K 4.5 04/30/2020   CO2 28 04/30/2020   GLUCOSE 91 04/30/2020   BUN 12 04/30/2020   CREATININE 0.58 04/30/2020   BILITOT 0.4 05/28/2020   ALKPHOS 75 05/28/2020   AST 18 05/28/2020   ALT 16 05/28/2020   PROT 7.5 05/28/2020   ALBUMIN 4.3 05/28/2020   CALCIUM 9.2 04/30/2020   GFR 108.65 04/30/2020   Lab Results  Component Value Date   CHOL 175 09/01/2018   Lab Results  Component Value Date   HDL 75 09/01/2018   Lab Results  Component Value Date   LDLCALC 85 09/01/2018   Lab Results  Component Value Date   TRIG 67 09/01/2018   Lab Results  Component Value Date   CHOLHDL 2.3 09/01/2018   Lab Results  Component Value Date   HGBA1C 5.9 05/18/2019       Assessment & Plan:   Problem List Items Addressed This Visit       Unprioritized   COVID-19 virus infection - Primary    Will rx with molnupiravir. Advised of CDC guidelines for self isolation/ ending isolation.  Advised of safe practice guidelines. Symptom Tier reviewed.  Encouraged to monitor for any worsening symptoms; watch for increased shortness of breath, weakness, and signs of dehydration. Advised when to seek emergency care.  Instructed to rest and hydrate well.  Advised to leave the house during recommended isolation period, only if it is necessary to seek medical care Pt verbalizes understanding.        Relevant Medications   Molnupiravir 200 MG CAPS     Meds ordered  this encounter  Medications   Molnupiravir 200 MG CAPS    Sig: Take 4 capsules (800 mg total) by mouth in the morning and at bedtime for 5 days.    Dispense:  40 capsule    Refill:  0    Order Specific Question:   Supervising Provider    Answer:   Penni Homans A [[id number]]    I discussed the assessment and treatment plan with the patient. The patient was provided an opportunity to ask questions and all were answered. The patient agreed with the plan and demonstrated an understanding of the instructions.   The patient was advised to call back or seek an in-person evaluation if the symptoms worsen or if the condition fails to improve as anticipated.  I provided 20 minutes of face-to-face time during this encounter.   Nance Pear, NP [employer] [facility] [employer] [facility] [phone] (phone) [phone] (fax)  [place]

## 2020-09-16 ENCOUNTER — Other Ambulatory Visit (HOSPITAL_BASED_OUTPATIENT_CLINIC_OR_DEPARTMENT_OTHER): Payer: Self-pay

## 2020-09-16 ENCOUNTER — Other Ambulatory Visit: Payer: Self-pay | Admitting: Family

## 2020-09-16 MED ORDER — LEVOTHYROXINE SODIUM 150 MCG PO TABS
ORAL_TABLET | ORAL | 1 refills | Status: DC
  Filled 2020-09-16: qty 90, 90d supply, fill #0
  Filled 2021-02-09: qty 90, 90d supply, fill #1

## 2020-09-17 ENCOUNTER — Other Ambulatory Visit (HOSPITAL_BASED_OUTPATIENT_CLINIC_OR_DEPARTMENT_OTHER): Payer: Self-pay

## 2020-09-19 ENCOUNTER — Other Ambulatory Visit (HOSPITAL_BASED_OUTPATIENT_CLINIC_OR_DEPARTMENT_OTHER): Payer: Self-pay

## 2020-10-15 ENCOUNTER — Other Ambulatory Visit (HOSPITAL_BASED_OUTPATIENT_CLINIC_OR_DEPARTMENT_OTHER): Payer: Self-pay

## 2020-10-15 MED FILL — Valacyclovir HCl Tab 500 MG: ORAL | 30 days supply | Qty: 30 | Fill #0 | Status: AC

## 2021-01-19 ENCOUNTER — Inpatient Hospital Stay (HOSPITAL_BASED_OUTPATIENT_CLINIC_OR_DEPARTMENT_OTHER): Admission: RE | Admit: 2021-01-19 | Payer: 59 | Source: Ambulatory Visit

## 2021-01-23 ENCOUNTER — Encounter (HOSPITAL_BASED_OUTPATIENT_CLINIC_OR_DEPARTMENT_OTHER): Payer: Self-pay

## 2021-01-23 ENCOUNTER — Other Ambulatory Visit: Payer: Self-pay

## 2021-01-23 ENCOUNTER — Ambulatory Visit (HOSPITAL_BASED_OUTPATIENT_CLINIC_OR_DEPARTMENT_OTHER)
Admission: RE | Admit: 2021-01-23 | Discharge: 2021-01-23 | Disposition: A | Discharge status: Home / Self Care | Payer: 59 | Source: Ambulatory Visit | Attending: Family | Admitting: Family

## 2021-01-23 DIAGNOSIS — Z1231 Encounter for screening mammogram for malignant neoplasm of breast: Secondary | ICD-10-CM | POA: Diagnosis not present

## 2021-01-23 DIAGNOSIS — Z0000 Encounter for general adult medical examination without abnormal findings: Secondary | ICD-10-CM | POA: Insufficient documentation

## 2021-02-05 ENCOUNTER — Encounter: Payer: Self-pay | Admitting: Family

## 2021-02-05 DIAGNOSIS — M25561 Pain in right knee: Secondary | ICD-10-CM

## 2021-02-09 ENCOUNTER — Other Ambulatory Visit (HOSPITAL_BASED_OUTPATIENT_CLINIC_OR_DEPARTMENT_OTHER): Payer: Self-pay

## 2021-02-09 ENCOUNTER — Other Ambulatory Visit: Payer: Self-pay | Admitting: Family

## 2021-02-09 MED ORDER — BUPROPION HCL ER (XL) 150 MG PO TB24
ORAL_TABLET | Freq: Every day | ORAL | 0 refills | Status: DC
  Filled 2021-02-09: qty 30, 30d supply, fill #0

## 2021-02-09 MED ORDER — AMLODIPINE BESYLATE 2.5 MG PO TABS
ORAL_TABLET | Freq: Every day | ORAL | 0 refills | Status: DC
  Filled 2021-02-09: qty 30, 30d supply, fill #0

## 2021-02-12 ENCOUNTER — Ambulatory Visit (INDEPENDENT_AMBULATORY_CARE_PROVIDER_SITE_OTHER): Payer: 59 | Admitting: Orthopedic Surgery

## 2021-02-12 ENCOUNTER — Other Ambulatory Visit: Payer: Self-pay

## 2021-02-12 ENCOUNTER — Ambulatory Visit (INDEPENDENT_AMBULATORY_CARE_PROVIDER_SITE_OTHER): Payer: 59

## 2021-02-12 DIAGNOSIS — G8929 Other chronic pain: Secondary | ICD-10-CM | POA: Diagnosis not present

## 2021-02-12 DIAGNOSIS — M25561 Pain in right knee: Secondary | ICD-10-CM

## 2021-02-15 ENCOUNTER — Encounter: Payer: Self-pay | Admitting: Orthopedic Surgery

## 2021-02-15 NOTE — Progress Notes (Signed)
Office Visit Note   Patient: Cheyenne Hawkins           Date of Birth: 1975-05-09           MRN: [id number] Visit Date: 02/12/2021              Requested by: Debbrah Alar, NP [address] [place],  [place] [address] PCP: Debbrah Alar, NP  Chief Complaint  Patient presents with   Right Knee - Pain      HPI: Patient is a 46 year old woman who presents with a several week history of right knee pain patellofemoral joint she denies any injuries.  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  She complains of global pain and swelling popping and start up stiffness.  She has tried Voltaren without relief.  Assessment & Plan: Visit Diagnoses:  1. Chronic pain of right knee     Plan: Recommended quad isometric straight leg raises as well as other isometric strengthening.  This was demonstrated.  Follow-Up Instructions: Return if symptoms worsen or fail to improve.   Ortho Exam  Patient is alert, oriented, no adenopathy, well-dressed, normal affect, normal respiratory effort. Examination patient has tenderness to palpation over the lateral patellofemoral joint there is crepitation with range of motion of the patellofemoral joint collaterals and cruciates are stable there is no tenderness to palpation over the medial or lateral joint line.  Imaging: No results found. No images are attached to the encounter.  Labs: Lab Results  Component Value Date   HGBA1C 5.9 05/18/2019     Lab Results  Component Value Date   ALBUMIN 4.3 05/28/2020   ALBUMIN 3.9 04/30/2020   ALBUMIN 4.0 05/30/2017    No results found for: MG Lab Results  Component Value Date   VD25OH 29 (L) 11/05/2011   VD25OH 13 (L) 06/15/2011    No results found for: PREALBUMIN CBC EXTENDED Latest Ref Rng & Units 05/28/2020 04/30/2020 09/01/2018  WBC 4.0 - 10.5 K/uL 7.1 6.8 7.9  RBC 3.87 - 5.11 Mil/uL  3.97 4.17 4.22  HGB 12.0 - 15.0 g/dL 11.1(L) 11.3(L) 11.2(L)  HCT 36.0 - 46.0 % 33.3(L) 35.8(L) 35.1  PLT 150.0 - 400.0 K/uL 385.0 379.0 365  NEUTROABS 1.4 - 7.7 K/uL 4.9 4.3 4,993  LYMPHSABS 0.7 - 4.0 K/uL 1.6 1.7 2,157     There is no height or weight on file to calculate BMI.  Orders:  Orders Placed This Encounter  Procedures   XR Knee 1-2 Views Right   No orders of the defined types were placed in this encounter.    Procedures: No procedures performed  Clinical Data: No additional findings.  ROS:  All other systems negative, except as noted in the HPI. Review of Systems  Objective: Vital Signs: LMP 01/16/2021   Specialty Comments:  No specialty comments available.  PMFS History: Patient Active Problem List   Diagnosis Date Noted   COVID-19 virus infection 08/15/2020   Onychomycosis 05/01/2020   Paroxysmal atrial fibrillation ([place]) 07/26/2017   Abnormal EKG 06/16/2017   Mitral valve prolapse 06/16/2017   Insomnia 08/12/2015   Tachycardia 08/12/2015   Anemia 05/15/2015   Finger injury 04/15/2014   Skin irritation 04/15/2014   Low back pain 06/01/2013   Hypothyroidism 06/16/2012   Anemia, iron deficiency 11/19/2011   Skin picking habit 11/05/2011   Tendonitis of elbow, right 09/03/2011  Preventative health care 08/02/2011   Galactorrhea 08/02/2011   HSV-2 (herpes simplex virus 2) infection 07/09/2011   Abdominal discomfort 03/17/2011   Depression with anxiety 08/18/2010   IBS (irritable bowel syndrome) 08/18/2010   GERD (gastroesophageal reflux disease) 08/18/2010   Atypical chest pain 04/24/2008   MITRAL VALVE PROLAPSE 04/19/2008   CONSTIPATION 04/19/2008   Past Medical History:  Diagnosis Date   Anemia, iron deficiency 11/19/2011   Arthritis    Atrial fibrillation ([place])    with Graves disease    Blood transfusion without reported diagnosis    Depression 2012   Fatty liver 03/21/2011   GERD (gastroesophageal reflux disease)    Grave's  disease 2002   s/p thyroidectomy   Heart murmur    MVP   History of chicken pox    HSV-2 (herpes simplex virus 2) infection    Hypertension     Family History  Problem Relation Age of Onset   Alcohol abuse Mother    Depression Mother    Bipolar disorder Mother    Alcohol abuse Father    Diabetes Father    Hypertension Father    Colon polyps Father    Cancer Maternal Grandmother        colon   Depression Maternal Grandmother    Diabetes Maternal Grandmother    Hypertension Maternal Grandmother    Colon cancer Maternal Grandmother    Colon polyps Maternal Grandmother    Cancer Paternal Grandmother        breast   Diabetes Paternal Grandmother    Hypertension Paternal Grandmother    Esophageal cancer Neg Hx    Rectal cancer Neg Hx    Stomach cancer Neg Hx     Past Surgical History:  Procedure Laterality Date   CESAREAN SECTION  2001   CESAREAN SECTION  2003   CESAREAN SECTION  2007   COLONOSCOPY  2010   [place]    THYROIDECTOMY  2003   for graves disease   TUBAL LIGATION     Social History   Occupational History    Employer: [employer]  Tobacco Use   Smoking status: Never   Smokeless tobacco: Never  Vaping Use   Vaping Use: Never used  Substance and Sexual Activity   Alcohol use: Yes    Alcohol/week: 10.0 standard drinks    Types: 10 Glasses of wine per week   Drug use: No   Sexual activity: Yes    Birth control/protection: Surgical    Comment: btl

## 2021-02-28 ENCOUNTER — Encounter: Payer: Self-pay | Admitting: Family

## 2021-03-02 NOTE — Telephone Encounter (Signed)
Appointment note updated for pap and CPE

## 2021-03-13 ENCOUNTER — Ambulatory Visit: Payer: 59 | Admitting: Family

## 2021-03-13 ENCOUNTER — Encounter: Payer: Self-pay | Admitting: Family

## 2021-03-13 ENCOUNTER — Other Ambulatory Visit (HOSPITAL_COMMUNITY)
Admission: RE | Admit: 2021-03-13 | Discharge: 2021-03-13 | Disposition: A | Discharge status: Home / Self Care | Payer: 59 | Source: Ambulatory Visit | Attending: Family | Admitting: Family

## 2021-03-13 VITALS — BP 121/83 | HR 84 | Temp 98.5°F | Resp 16 | Wt 193.0 lb

## 2021-03-13 DIAGNOSIS — E039 Hypothyroidism, unspecified: Secondary | ICD-10-CM | POA: Diagnosis not present

## 2021-03-13 DIAGNOSIS — Z113 Encounter for screening for infections with a predominantly sexual mode of transmission: Secondary | ICD-10-CM | POA: Insufficient documentation

## 2021-03-13 DIAGNOSIS — R8290 Unspecified abnormal findings in urine: Secondary | ICD-10-CM | POA: Diagnosis not present

## 2021-03-13 DIAGNOSIS — I10 Essential (primary) hypertension: Secondary | ICD-10-CM

## 2021-03-13 DIAGNOSIS — N760 Acute vaginitis: Secondary | ICD-10-CM | POA: Diagnosis not present

## 2021-03-13 DIAGNOSIS — F418 Other specified anxiety disorders: Secondary | ICD-10-CM | POA: Diagnosis not present

## 2021-03-13 DIAGNOSIS — D509 Iron deficiency anemia, unspecified: Secondary | ICD-10-CM | POA: Diagnosis not present

## 2021-03-13 LAB — COMPREHENSIVE METABOLIC PANEL
ALT: 15 U/L (ref 0–35)
AST: 17 U/L (ref 0–37)
Albumin: 4.2 g/dL (ref 3.5–5.2)
Alkaline Phosphatase: 70 U/L (ref 39–117)
BUN: 13 mg/dL (ref 6–23)
CO2: 28 mEq/L (ref 19–32)
Calcium: 9.3 mg/dL (ref 8.4–10.5)
Chloride: 101 mEq/L (ref 96–112)
Creatinine, Ser: 0.62 mg/dL (ref 0.40–1.20)
GFR: 106.27 mL/min (ref >60.00)
Glucose, Bld: 111 mg/dL — ABNORMAL HIGH (ref 70–99)
Potassium: 4 mEq/L (ref 3.5–5.1)
Sodium: 136 mEq/L (ref 135–145)
Total Bilirubin: 0.5 mg/dL (ref 0.2–1.2)
Total Protein: 7.4 g/dL (ref 6.0–8.3)

## 2021-03-13 LAB — POC URINALSYSI DIPSTICK (AUTOMATED)
Glucose, UA: NEGATIVE
Ketones, UA: NEGATIVE
Leukocytes, UA: NEGATIVE
Nitrite, UA: NEGATIVE
Protein, UA: POSITIVE — AB
Spec Grav, UA: >=1.03 — AB (ref 1.010–1.025)
Urobilinogen, UA: 0.2 E.U./dL
pH, UA: 6 (ref 5.0–8.0)

## 2021-03-13 LAB — CBC WITH DIFFERENTIAL/PLATELET
Basophils Absolute: 0 10*3/uL (ref 0.0–0.1)
Basophils Relative: 0.5 % (ref 0.0–3.0)
Eosinophils Absolute: 0.1 10*3/uL (ref 0.0–0.7)
Eosinophils Relative: 0.8 % (ref 0.0–5.0)
HCT: 33.6 % — ABNORMAL LOW (ref 36.0–46.0)
Hemoglobin: 10.9 g/dL — ABNORMAL LOW (ref 12.0–15.0)
Lymphocytes Relative: 23.6 % (ref 12.0–46.0)
Lymphs Abs: 1.6 10*3/uL (ref 0.7–4.0)
MCHC: 32.6 g/dL (ref 30.0–36.0)
MCV: 84.6 fl (ref 78.0–100.0)
Monocytes Absolute: 0.7 10*3/uL (ref 0.1–1.0)
Monocytes Relative: 10.3 % (ref 3.0–12.0)
Neutro Abs: 4.4 10*3/uL (ref 1.4–7.7)
Neutrophils Relative %: 64.8 % (ref 43.0–77.0)
Platelets: 364 10*3/uL (ref 150.0–400.0)
RBC: 3.97 Mil/uL (ref 3.87–5.11)
RDW: 14.5 % (ref 11.5–15.5)
WBC: 6.7 10*3/uL (ref 4.0–10.5)

## 2021-03-13 LAB — FERRITIN: Ferritin: 5.8 ng/mL — ABNORMAL LOW (ref 10.0–291.0)

## 2021-03-13 LAB — TSH: TSH: 2.88 u[IU]/mL (ref 0.35–5.50)

## 2021-03-13 LAB — IRON: Iron: 58 ug/dL (ref 42–145)

## 2021-03-13 NOTE — Assessment & Plan Note (Signed)
Notes significant stress. Working with her therapist and strongly considering filing for divorce.  ?

## 2021-03-13 NOTE — Progress Notes (Signed)
? ?Subjective:  ? ?By signing my name below, I, Cheyenne Hawkins, attest that this documentation has been prepared under the direction and in the presence of Cheyenne Alar NP. 03/13/2021 ? ? Patient ID: Cheyenne Hawkins, female    DOB: 10/01/1975, 46 y.o.   MRN: [id number] ? ?Chief Complaint  ?Patient presents with  ? Urine odor  ?  Complains of abnormal odor of urine  ? ? ?HPI ?Patient is in today for an office visit.  ? ?Patient complains of an unusual strong urine smell during and after urination. Patient is recommended to do a self-swab. Patient denies of unusual discharge and dysuria.  Patient is requesting an HIV test.  ? ?Health Maintenance Due  ?Topic Date Due  ? Hepatitis C Screening  Never done  ? COVID-19 Vaccine (3 - Booster for Pfizer series) 04/23/2019  ? ? ?Past Medical History:  ?Diagnosis Date  ? Anemia, iron deficiency 11/19/2011  ? Arthritis   ? Atrial fibrillation ([place])   ? with Graves disease   ? Blood transfusion without reported diagnosis   ? COVID-19 virus infection 08/15/2020  ? Depression 2012  ? Fatty liver 03/21/2011  ? GERD (gastroesophageal reflux disease)   ? Grave's disease 2002  ? s/p thyroidectomy  ? Heart murmur   ? MVP  ? History of chicken pox   ? HSV-2 (herpes simplex virus 2) infection   ? Hypertension   ? ? ?Past Surgical History:  ?Procedure Laterality Date  ? CESAREAN SECTION  2001  ? CESAREAN SECTION  2003  ? CESAREAN SECTION  2007  ? COLONOSCOPY  2010  ? [facility]   ? THYROIDECTOMY  2003  ? for graves disease  ? TUBAL LIGATION    ? ? ?Family History  ?Problem Relation Age of Onset  ? Alcohol abuse Mother   ? Depression Mother   ? Bipolar disorder Mother   ? Alcohol abuse Father   ? Diabetes Father   ? Hypertension Father   ? Colon polyps Father   ? Cancer Maternal Grandmother   ?     colon  ? Depression Maternal Grandmother   ? Diabetes Maternal Grandmother   ? Hypertension Maternal Grandmother   ? Colon cancer Maternal Grandmother   ? Colon polyps Maternal Grandmother   ?  Cancer Paternal Grandmother   ?     breast  ? Diabetes Paternal Grandmother   ? Hypertension Paternal Grandmother   ? Esophageal cancer Neg Hx   ? Rectal cancer Neg Hx   ? Stomach cancer Neg Hx   ? ? ?Social History  ? ?Socioeconomic History  ? Marital status: Married  ?  Spouse name: Not on file  ? Number of children: 3  ? Years of education: Not on file  ? Highest education level: Not on file  ?Occupational History  ?  Employer: [employer]  ?Tobacco Use  ? Smoking status: Never  ? Smokeless tobacco: Never  ?Vaping Use  ? Vaping Use: Never used  ?Substance and Sexual Activity  ? Alcohol use: Yes  ?  Alcohol/week: 10.0 standard drinks  ?  Types: 10 Glasses of wine per week  ? Drug use: No  ? Sexual activity: Yes  ?  Birth control/protection: Surgical  ?  Comment: btl  ?Other Topics Concern  ? Not on file  ?Social History Narrative  ? Regular exercise:  No  ? Caffeine Use: 2 drinks weekly  ?   ?   ? ?Social Determinants of Health  ? ?  Financial Resource Strain: Not on file  ?Food Insecurity: Not on file  ?Transportation Needs: Not on file  ?Physical Activity: Not on file  ?Stress: Not on file  ?Social Connections: Not on file  ?Intimate Partner Violence: Not on file  ? ? ?Outpatient Medications Prior to Visit  ?Medication Sig Dispense Refill  ? amLODipine (NORVASC) 2.5 MG tablet TAKE 1 TABLET (2.5 MG TOTAL) BY MOUTH DAILY. 30 tablet 0  ? aspirin EC 81 MG tablet Take 1 tablet (81 mg total) by mouth daily. 90 tablet 3  ? buPROPion (WELLBUTRIN XL) 150 MG 24 hr tablet TAKE 1 TABLET (150 MG TOTAL) BY MOUTH DAILY. 30 tablet 0  ? diclofenac (VOLTAREN) 75 MG EC tablet TAKE 1 TABLET BY MOUTH TWICE A DAY WITH MEALS 60 tablet 3  ? escitalopram (LEXAPRO) 20 MG tablet TAKE 1 TABLET BY MOUTH ONCE DAILY 90 tablet 1  ? levothyroxine (SYNTHROID) 150 MCG tablet TAKE 1 TABLET (150 MCG TOTAL) BY MOUTH DAILY. 90 tablet 1  ? Multiple Vitamins-Minerals (MULTIVITAMIN WITH MINERALS) tablet Take 1 tablet by mouth daily.    ? valACYclovir  (VALTREX) 500 MG tablet TAKE 1 TABLET (500 MG TOTAL) BY MOUTH DAILY AS NEEDED. 30 tablet 2  ? ferrous sulfate 325 (65 FE) MG tablet Take 1 tablet (325 mg total) by mouth every other day. (Patient not taking: Reported on 03/13/2021)  3  ? ?No facility-administered medications prior to visit.  ? ? ?No Known Allergies ? ?Review of Systems  ?Genitourinary:  Negative for dysuria.  ?     (+) Strong urine smell during and after urination  ? ?   ?Objective:  ?  ?Physical Exam ?Constitutional:   ?   General: She is not in acute distress. ?   Appearance: Normal appearance. She is not ill-appearing.  ?HENT:  ?   Right Ear: External ear normal.  ?   Left Ear: External ear normal.  ?Eyes:  ?   Extraocular Movements: Extraocular movements intact.  ?   Pupils: Pupils are equal, round, and reactive to light.  ?Cardiovascular:  ?   Rate and Rhythm: Normal rate and regular rhythm.  ?   Heart sounds: Normal heart sounds. No murmur heard. ?  No gallop.  ?Pulmonary:  ?   Effort: Pulmonary effort is normal. No respiratory distress.  ?   Breath sounds: Normal breath sounds. No wheezing or rales.  ?Skin: ?   General: Skin is warm and dry.  ?Neurological:  ?   Mental Status: She is alert and oriented to person, place, and time.  ?Psychiatric:     ?   Behavior: Behavior normal.  ? ? ?BP 121/83 (BP Location: Right Arm, Patient Position: Sitting, Cuff Size: Small)   Pulse 84   Temp 98.5 ?F (36.9 ?C) (Oral)   Resp 16   Wt 193 lb (87.5 kg)   SpO2 100%   BMI 33.13 kg/m?  ?Wt Readings from Last 3 Encounters:  ?03/13/21 193 lb (87.5 kg)  ?06/24/20 195 lb (88.5 kg)  ?05/30/20 195 lb (88.5 kg)  ? ? ?   ?Assessment & Plan:  ? ?Problem List Items Addressed This Visit   ? ?  ? Unprioritized  ? Anemia, iron deficiency (Chronic)  ?  Continues iron supplement. Obtain follow up labs.  ?  ?  ? Relevant Orders  ? CBC with Differential/Platelet  ? Iron  ? Ferritin  ? Screen for STD (sexually transmitted disease)  ?  Pt performed self wet prep.  Will also  check for BV/Yeast due c/o odor.  ?  ?  ? Relevant Orders  ? Cervicovaginal ancillary only( )  ? HIV antibody (with reflex)  ? Hypothyroidism - Primary  ?  Clinically stable on synthroid. Obtain follow up TSH.  ?  ?  ? Relevant Orders  ? TSH  ? Depression with anxiety  ?  Notes significant stress. Working with her therapist and strongly considering filing for divorce.  ?  ?  ? Abnormal urine odor  ?  UA notes blood (has menses today). Will send urine for culture.  ?  ?  ? Relevant Orders  ? POCT Urinalysis Dipstick (Automated) (Completed)  ? Urine Culture  ? ?Other Visit Diagnoses   ? ? Essential hypertension      ? Relevant Orders  ? Comp Met (CMET)  ? ?  ? ? ? ?No orders of the defined types were placed in this encounter. ? ? ?I, Nance Pear, NP, personally preformed the services described in this documentation.  All medical record entries made by the scribe were at my direction and in my presence.  I have reviewed the chart and discharge instructions (if applicable) and agree that the record reflects my personal performance and is accurate and complete. 03/13/2021 ? ? ?Engineering geologist as a Education administrator for [employer], NP.,have documented all relevant documentation on the behalf of Nance Pear, NP,as directed by  Nance Pear, NP while in the presence of Nance Pear, NP. ? ? ?Nance Pear, NP ? ?

## 2021-03-13 NOTE — Assessment & Plan Note (Signed)
Pt performed self wet prep.  Will also check for BV/Yeast due c/o odor.  ?

## 2021-03-13 NOTE — Assessment & Plan Note (Signed)
UA notes blood (has menses today). Will send urine for culture.  ?

## 2021-03-13 NOTE — Assessment & Plan Note (Signed)
Clinically stable on synthroid. Obtain follow up TSH.  

## 2021-03-13 NOTE — Assessment & Plan Note (Signed)
Continues iron supplement. Obtain follow up labs.  ?

## 2021-03-15 LAB — URINE CULTURE
MICRO NUMBER:: 13085209
Result:: NO GROWTH
SPECIMEN QUALITY:: ADEQUATE

## 2021-03-16 LAB — CERVICOVAGINAL ANCILLARY ONLY
Bacterial Vaginitis (gardnerella): NEGATIVE
Candida Glabrata: NEGATIVE
Candida Vaginitis: NEGATIVE
Chlamydia: NEGATIVE
Comment: NEGATIVE
Comment: NEGATIVE
Comment: NEGATIVE
Comment: NEGATIVE
Comment: NEGATIVE
Comment: NORMAL
Neisseria Gonorrhea: NEGATIVE
Trichomonas: NEGATIVE

## 2021-03-16 LAB — HIV ANTIBODY (ROUTINE TESTING W REFLEX): HIV 1&2 Ab, 4th Generation: NONREACTIVE

## 2021-03-31 ENCOUNTER — Other Ambulatory Visit: Payer: Self-pay | Admitting: Family

## 2021-03-31 ENCOUNTER — Other Ambulatory Visit (HOSPITAL_BASED_OUTPATIENT_CLINIC_OR_DEPARTMENT_OTHER): Payer: Self-pay

## 2021-03-31 MED ORDER — BUPROPION HCL ER (XL) 150 MG PO TB24
ORAL_TABLET | Freq: Every day | ORAL | 0 refills | Status: DC
  Filled 2021-03-31: qty 90, 90d supply, fill #0

## 2021-03-31 MED ORDER — AMLODIPINE BESYLATE 2.5 MG PO TABS
ORAL_TABLET | Freq: Every day | ORAL | 0 refills | Status: DC
  Filled 2021-03-31: qty 90, 90d supply, fill #0

## 2021-05-01 ENCOUNTER — Encounter: Payer: Self-pay | Admitting: Family

## 2021-05-01 ENCOUNTER — Other Ambulatory Visit (HOSPITAL_BASED_OUTPATIENT_CLINIC_OR_DEPARTMENT_OTHER): Payer: Self-pay

## 2021-05-01 ENCOUNTER — Ambulatory Visit (INDEPENDENT_AMBULATORY_CARE_PROVIDER_SITE_OTHER): Payer: 59 | Admitting: Family

## 2021-05-01 VITALS — BP 115/76 | HR 80 | Temp 98.1°F | Resp 16 | Ht 64.2 in | Wt 189.0 lb

## 2021-05-01 DIAGNOSIS — Z0000 Encounter for general adult medical examination without abnormal findings: Secondary | ICD-10-CM

## 2021-05-01 DIAGNOSIS — F418 Other specified anxiety disorders: Secondary | ICD-10-CM

## 2021-05-01 DIAGNOSIS — W19XXXA Unspecified fall, initial encounter: Secondary | ICD-10-CM | POA: Diagnosis not present

## 2021-05-01 MED ORDER — VALACYCLOVIR HCL 1 G PO TABS
ORAL_TABLET | ORAL | 5 refills | Status: DC
  Filled 2021-05-01: qty 20, 20d supply, fill #0

## 2021-05-01 NOTE — Assessment & Plan Note (Signed)
Encouraged her to continue her healthy diet and regular exercise. mammo pap and colo up to date. Recommended that she get bivalent vaccine.  ?

## 2021-05-01 NOTE — Assessment & Plan Note (Signed)
Her neuro exam is normal.  However, I am concerned about her recurrent falls and c/o dragging left leg. Will refer to neurology for further evaluation.  ?

## 2021-05-01 NOTE — Progress Notes (Signed)
? ?Subjective:  ? ?By signing my name below, I, Cheyenne Hawkins, attest that this documentation has been prepared under the direction and in the presence of Debbrah Alar NP, 05/01/2021 ? ? Patient ID: Cheyenne Hawkins, female    DOB: Dec 19, 1975, 46 y.o.   MRN: [id number] ? ?Chief Complaint  ?Patient presents with  ? Annual Exam  ?   ?  ? ? ?HPI ?Patient is in today for a comprehensive physical exam. ? ?Refills - She is requesting a refill of 1000 MG of Valtrex.  ? ?Hot Flashes - She complains of hot flashes. She states that the onset of symptoms are sudden. She is still menstruating. Her cycle is every 28 days, the duration is on average 3 days. On her heavyset days, she has to wear a tampon and a pad and change every 2 hours.  ? ?Anxiety - She complains that her anxiety is worsening. She occasionally gets an unsettling feeling that lasts for hours. She states that it probably relates to her stress levels. She has been increasingly doubting herself. She has been taking 20 MG of Lexapro has prescribed. She has been going to counseling and couple's counseling. ? ?Falls - She complains that she has trouble fulling lifting her left leg which causes her to fall. She states that symptoms are occasional. She notes that she would fall up the stairs.  She cannot tell if her left leg is weaker or stiffer. She states that she has fallen at least twice in the past months. She denies of any numbness. ? ?Indigestion - She states that she has a history of indigestion.  ? ?She denies having any fever, ear pain, new muscle pain, joint pain, new moles, congestion, sinus pain, sore throat, palpations, wheezing, n/v/d, constipation, blood in stool, dysuria, frequency, hematuria, headaches, depresssion or anxiety at this time. ? ?Social History: She reports no recent surgeries. She states that her father has been having bouts of confusion. Her sister has hypertension and undiagnosed depression. Both her younger and older brother  has attention deficient. Her children have a history of environmental allergies. She states that she has a family history of osteoarthritis.  ?Colonoscopy: Last completed on 06/24/2020 ?Pap Smear: Last completed on 04/30/2020 ?Mammogram: Last completed on 01/23/2021 ?Immunizations: She has not taken the COVID 19 Bivalent booster. She is UTD on the Influenza vaccine and TDAP.  ?Diet: She is improving her diet. She has been decreasing the amount of junk food she consumes.  ?Exercise: She has been trying to exercise more regularly.  ?  ? ?Health Maintenance Due  ?Topic Date Due  ? Hepatitis C Screening  Never done  ? COVID-19 Vaccine (3 - Booster for Pfizer series) 04/23/2019  ? ? ?Past Medical History:  ?Diagnosis Date  ? Anemia, iron deficiency 11/19/2011  ? Arthritis   ? Atrial fibrillation ([place])   ? with Graves disease   ? Blood transfusion without reported diagnosis   ? COVID-19 virus infection 08/15/2020  ? Depression 2012  ? Fatty liver 03/21/2011  ? GERD (gastroesophageal reflux disease)   ? Grave's disease 2002  ? s/p thyroidectomy  ? Heart murmur   ? MVP  ? History of chicken pox   ? HSV-2 (herpes simplex virus 2) infection   ? Hypertension   ? ? ?Past Surgical History:  ?Procedure Laterality Date  ? CESAREAN SECTION  2001  ? CESAREAN SECTION  2003  ? CESAREAN SECTION  2007  ? COLONOSCOPY  2010  ? [facility]   ?  THYROIDECTOMY  2003  ? for graves disease  ? TUBAL LIGATION    ? ? ?Family History  ?Problem Relation Age of Onset  ? Alcohol abuse Mother   ? Depression Mother   ? Bipolar disorder Mother   ? Alcohol abuse Father   ? Diabetes Father   ? Hypertension Father   ? Colon polyps Father   ? CVA Father   ? Osteoarthritis Father   ? Hypertension Sister   ? Depression Sister   ? ADD / ADHD Brother   ? ADD / ADHD Brother   ? Cancer Maternal Grandmother   ?     colon  ? Depression Maternal Grandmother   ? Diabetes Maternal Grandmother   ? Hypertension Maternal Grandmother   ? Colon cancer Maternal Grandmother   ? Colon  polyps Maternal Grandmother   ? Cancer Paternal Grandmother   ?     breast  ? Diabetes Paternal Grandmother   ? Hypertension Paternal Grandmother   ? Environmental Allergies Daughter   ? Environmental Allergies Daughter   ? Environmental Allergies Son   ? Esophageal cancer Neg Hx   ? Rectal cancer Neg Hx   ? Stomach cancer Neg Hx   ? ? ?Social History  ? ?Socioeconomic History  ? Marital status: Married  ?  Spouse name: Not on file  ? Number of children: 3  ? Years of education: Not on file  ? Highest education level: Not on file  ?Occupational History  ?  Employer: [employer]  ?Tobacco Use  ? Smoking status: Never  ? Smokeless tobacco: Never  ?Vaping Use  ? Vaping Use: Never used  ?Substance and Sexual Activity  ? Alcohol use: Yes  ?  Alcohol/week: 10.0 standard drinks  ?  Types: 10 Glasses of wine per week  ? Drug use: No  ? Sexual activity: Yes  ?  Birth control/protection: Surgical  ?  Comment: btl  ?Other Topics Concern  ? Not on file  ?Social History Narrative  ? Regular exercise:  No  ? Caffeine Use: 2 drinks weekly  ?   ?   ? ?Social Determinants of Health  ? ?Financial Resource Strain: Not on file  ?Food Insecurity: Not on file  ?Transportation Needs: Not on file  ?Physical Activity: Not on file  ?Stress: Not on file  ?Social Connections: Not on file  ?Intimate Partner Violence: Not on file  ? ? ?Outpatient Medications Prior to Visit  ?Medication Sig Dispense Refill  ? amLODipine (NORVASC) 2.5 MG tablet TAKE 1 TABLET (2.5 MG TOTAL) BY MOUTH DAILY. 90 tablet 0  ? aspirin EC 81 MG tablet Take 1 tablet (81 mg total) by mouth daily. 90 tablet 3  ? buPROPion (WELLBUTRIN XL) 150 MG 24 hr tablet TAKE 1 TABLET (150 MG TOTAL) BY MOUTH DAILY. 90 tablet 0  ? escitalopram (LEXAPRO) 20 MG tablet TAKE 1 TABLET BY MOUTH ONCE DAILY 90 tablet 1  ? ferrous sulfate 325 (65 FE) MG tablet Take 1 tablet (325 mg total) by mouth every other day.  3  ? levothyroxine (SYNTHROID) 150 MCG tablet TAKE 1 TABLET (150 MCG TOTAL) BY MOUTH  DAILY. 90 tablet 1  ? Multiple Vitamins-Minerals (MULTIVITAMIN WITH MINERALS) tablet Take 1 tablet by mouth daily.    ? ?No facility-administered medications prior to visit.  ? ? ?No Known Allergies ? ?Review of Systems  ?Constitutional:  Positive for diaphoresis (Hot Flashes). Negative for fever.  ?HENT:  Negative for congestion, ear pain, sinus pain  and sore throat.   ?Respiratory:  Negative for wheezing.   ?Cardiovascular:  Negative for palpitations.  ?Gastrointestinal:  Negative for blood in stool, constipation, diarrhea, nausea and vomiting.  ?     (+) indigestion  ?Genitourinary:  Negative for dysuria, frequency and hematuria.  ?Musculoskeletal:  Positive for falls. Negative for joint pain and myalgias.  ?Skin:   ?     (-) [place]  ?Neurological:  Negative for headaches.  ?Psychiatric/Behavioral:  Negative for depression. The patient is nervous/anxious.   ? ?   ?Objective:  ?  ?Physical Exam ?Constitutional:   ?   General: She is not in acute distress. ?   Appearance: Normal appearance. She is not ill-appearing.  ?HENT:  ?   Head: Normocephalic and atraumatic.  ?   Right Ear: Tympanic membrane, ear canal and external ear normal.  ?   Left Ear: Tympanic membrane, ear canal and external ear normal.  ?Eyes:  ?   Extraocular Movements: Extraocular movements intact.  ?   Pupils: Pupils are equal, round, and reactive to light.  ?Neck:  ?   Thyroid: No thyromegaly.  ?Cardiovascular:  ?   Rate and Rhythm: Normal rate and regular rhythm.  ?   Heart sounds: Normal heart sounds. No murmur heard. ?  No gallop.  ?Pulmonary:  ?   Effort: Pulmonary effort is normal. No respiratory distress.  ?   Breath sounds: Normal breath sounds. No wheezing or rales.  ?Abdominal:  ?   General: Bowel sounds are normal. There is no distension.  ?   Palpations: Abdomen is soft.  ?   Tenderness: There is no abdominal tenderness. There is no guarding.  ?Musculoskeletal:  ?   Comments: 5/5 strength in both upper and lower extremities   ?Lymphadenopathy:  ?   Cervical: No cervical adenopathy.  ?Skin: ?   General: Skin is warm and dry.  ?Neurological:  ?   Mental Status: She is alert and oriented to person, place, and time.  ?   Deep Tendon R

## 2021-05-01 NOTE — Assessment & Plan Note (Signed)
Anxiety symptoms have worsened recently.  I think family/work stress as well as some perimenopausal hormonal fluctuations are likely contributing. Will continue current meds/doses. She is working with a Social worker. She will let me know if her symptoms worsen or if they do not improve in the coming months. ?

## 2021-05-07 ENCOUNTER — Encounter: Payer: Self-pay | Admitting: Neurology

## 2021-05-27 DIAGNOSIS — F331 Major depressive disorder, recurrent, moderate: Secondary | ICD-10-CM | POA: Diagnosis not present

## 2021-06-15 ENCOUNTER — Other Ambulatory Visit (HOSPITAL_BASED_OUTPATIENT_CLINIC_OR_DEPARTMENT_OTHER): Payer: Self-pay

## 2021-06-15 MED ORDER — IBUPROFEN 600 MG PO TABS
ORAL_TABLET | ORAL | 0 refills | Status: DC
  Filled 2021-06-15: qty 10, 3d supply, fill #0

## 2021-06-15 MED ORDER — AMOXICILLIN 500 MG PO CAPS
ORAL_CAPSULE | ORAL | 0 refills | Status: DC
Start: 2021-06-15 — End: 2021-09-29
  Filled 2021-06-15: qty 28, 7d supply, fill #0

## 2021-06-23 ENCOUNTER — Other Ambulatory Visit: Payer: Self-pay | Admitting: Family

## 2021-06-23 ENCOUNTER — Other Ambulatory Visit (HOSPITAL_BASED_OUTPATIENT_CLINIC_OR_DEPARTMENT_OTHER): Payer: Self-pay

## 2021-06-23 MED ORDER — LEVOTHYROXINE SODIUM 150 MCG PO TABS
ORAL_TABLET | ORAL | 1 refills | Status: DC
  Filled 2021-06-23: qty 90, 90d supply, fill #0
  Filled 2021-10-26: qty 90, 90d supply, fill #1

## 2021-06-25 DIAGNOSIS — F411 Generalized anxiety disorder: Secondary | ICD-10-CM | POA: Diagnosis not present

## 2021-06-25 DIAGNOSIS — F331 Major depressive disorder, recurrent, moderate: Secondary | ICD-10-CM | POA: Diagnosis not present

## 2021-07-09 DIAGNOSIS — F339 Major depressive disorder, recurrent, unspecified: Secondary | ICD-10-CM | POA: Diagnosis not present

## 2021-07-10 ENCOUNTER — Other Ambulatory Visit (HOSPITAL_BASED_OUTPATIENT_CLINIC_OR_DEPARTMENT_OTHER): Payer: Self-pay

## 2021-07-10 ENCOUNTER — Other Ambulatory Visit: Payer: Self-pay | Admitting: Family

## 2021-07-10 MED ORDER — ESCITALOPRAM OXALATE 20 MG PO TABS
ORAL_TABLET | Freq: Every day | ORAL | 1 refills | Status: DC
  Filled 2021-07-10: qty 90, 90d supply, fill #0

## 2021-07-31 DIAGNOSIS — F331 Major depressive disorder, recurrent, moderate: Secondary | ICD-10-CM | POA: Diagnosis not present

## 2021-07-31 DIAGNOSIS — F411 Generalized anxiety disorder: Secondary | ICD-10-CM | POA: Diagnosis not present

## 2021-08-18 ENCOUNTER — Other Ambulatory Visit: Payer: Self-pay | Admitting: Family

## 2021-08-19 ENCOUNTER — Other Ambulatory Visit (HOSPITAL_BASED_OUTPATIENT_CLINIC_OR_DEPARTMENT_OTHER): Payer: Self-pay

## 2021-08-19 MED ORDER — AMLODIPINE BESYLATE 2.5 MG PO TABS
ORAL_TABLET | Freq: Every day | ORAL | 0 refills | Status: DC
  Filled 2021-08-19: qty 90, 90d supply, fill #0

## 2021-08-19 MED ORDER — BUPROPION HCL ER (XL) 150 MG PO TB24
ORAL_TABLET | Freq: Every day | ORAL | 0 refills | Status: DC
  Filled 2021-08-19: qty 90, 90d supply, fill #0

## 2021-08-28 DIAGNOSIS — F331 Major depressive disorder, recurrent, moderate: Secondary | ICD-10-CM | POA: Diagnosis not present

## 2021-08-28 DIAGNOSIS — F411 Generalized anxiety disorder: Secondary | ICD-10-CM | POA: Diagnosis not present

## 2021-09-24 DIAGNOSIS — F331 Major depressive disorder, recurrent, moderate: Secondary | ICD-10-CM | POA: Diagnosis not present

## 2021-09-24 DIAGNOSIS — F411 Generalized anxiety disorder: Secondary | ICD-10-CM | POA: Diagnosis not present

## 2021-09-28 NOTE — Progress Notes (Unsigned)
NEUROLOGY CONSULTATION NOTE  Cheyenne Hawkins MRN: [id number] DOB: 28-Jan-1975  Referring provider: Debbrah Alar, NP Primary care provider: Debbrah Alar, NP  Reason for consult:  fall  Assessment/Plan:   History of falls - endorses possible left leg weakness.  No objective lateralizing findings on neurologic exam. She does exhibit hyperreflexia in the knees but this may be physiologic as other reflexes are normal and she does not exhibit pathologic reflexes.  Regardless, given her symptoms, would check MRI of cervical and thoracic spine without contrast to evaluate for spinal cord lesion to explain symptoms.  If unremarkable, would check MRI of brain.  Further recommendations pending results.    Subjective:  Cheyenne Hawkins is a 46 year old female with acquired thyroidism (history of Graves disease), HTN, generalized anxiety disorder, depression, HSV-2, who presents for falls.  History supplemented by referring provider's note.  Reports feeling wobbly for about 2 years.  On average she would fall twice a year.  Symptoms are unchanged.  She feels like it occurs due to her left leg.  When she walks up stairs, it seems that her foot doesn't clear the step and she will trip and fall.  She has some low back pain but no radicular pain or numbness down the leg.  No bowel or bladder dysfunction.  She does report sometimes numbness in the hands, particularly when she wakes up in bed.  No double vision.  No known family history of neurologic disorders.   PAST MEDICAL HISTORY: Past Medical History:  Diagnosis Date   Anemia, iron deficiency 11/19/2011   Arthritis    Atrial fibrillation ([place])    with Graves disease    Blood transfusion without reported diagnosis    COVID-19 virus infection 08/15/2020   Depression 2012   Fatty liver 03/21/2011   GERD (gastroesophageal reflux disease)    Grave's disease 2002   s/p thyroidectomy   Heart murmur    MVP   History of chicken pox     HSV-2 (herpes simplex virus 2) infection    Hypertension     PAST SURGICAL HISTORY: Past Surgical History:  Procedure Laterality Date   CESAREAN SECTION  2001   CESAREAN SECTION  2003   CESAREAN SECTION  2007   COLONOSCOPY  2010   [place]    THYROIDECTOMY  2003   for graves disease   TUBAL LIGATION      MEDICATIONS: Current Outpatient Medications on File Prior to Visit  Medication Sig Dispense Refill   amLODipine (NORVASC) 2.5 MG tablet TAKE 1 TABLET (2.5 MG TOTAL) BY MOUTH DAILY. 90 tablet 0   amoxicillin (AMOXIL) 500 MG capsule Take 1 capsule by mouth 4 times a day 28 capsule 0   aspirin EC 81 MG tablet Take 1 tablet (81 mg total) by mouth daily. 90 tablet 3   buPROPion (WELLBUTRIN XL) 150 MG 24 hr tablet TAKE 1 TABLET (150 MG TOTAL) BY MOUTH DAILY. 90 tablet 0   escitalopram (LEXAPRO) 20 MG tablet TAKE 1 TABLET BY MOUTH ONCE DAILY 90 tablet 1   ferrous sulfate 325 (65 FE) MG tablet Take 1 tablet (325 mg total) by mouth every other day.  3   ibuprofen (ADVIL) 600 MG tablet Take 1 tablet by mouth every 4 to 6 hours 10 tablet 0   levothyroxine (SYNTHROID) 150 MCG tablet TAKE 1 TABLET (150 MCG TOTAL) BY MOUTH DAILY. 90 tablet 1   Multiple Vitamins-Minerals (MULTIVITAMIN WITH MINERALS) tablet Take 1 tablet by mouth daily.  valACYclovir (VALTREX) 1000 MG tablet Take 1 tablet by mouth once daily for 5 days as needed for outbreak 20 tablet 5   No current facility-administered medications on file prior to visit.    ALLERGIES: No Known Allergies  FAMILY HISTORY: Family History  Problem Relation Age of Onset   Alcohol abuse Mother    Depression Mother    Bipolar disorder Mother    Alcohol abuse Father    Diabetes Father    Hypertension Father    Colon polyps Father    CVA Father    Osteoarthritis Father    Hypertension Sister    Depression Sister    ADD / ADHD Brother    ADD / ADHD Brother    Cancer Maternal Grandmother        colon   Depression Maternal Grandmother     Diabetes Maternal Grandmother    Hypertension Maternal Grandmother    Colon cancer Maternal Grandmother    Colon polyps Maternal Grandmother    Cancer Paternal Grandmother        breast   Diabetes Paternal Grandmother    Hypertension Paternal Ecologist Allergies Daughter    Environmental Allergies Daughter    Environmental Allergies Son    Esophageal cancer Neg Hx    Rectal cancer Neg Hx    Stomach cancer Neg Hx     Objective:  Blood pressure 113/77, pulse 85, height '5\' 4"'$  (1.626 m), weight 188 lb 6.4 oz (85.5 kg), SpO2 98 %. General: No acute distress.  Patient appears well-groomed.   Head:  Normocephalic/atraumatic Eyes:  fundi examined but not visualized Neck: supple, no paraspinal tenderness, full range of motion Back: No paraspinal tenderness Heart: regular rate and rhythm Lungs: Clear to auscultation bilaterally. Vascular: No carotid bruits. Neurological Exam: Mental status: alert and oriented to person, place, and time, speech fluent and not dysarthric, language intact. Cranial nerves: CN I: not tested CN II: pupils equal, round and reactive to light, visual fields intact CN III, IV, VI:  full range of motion, no nystagmus, no ptosis CN V: facial sensation intact. CN VII: upper and lower face symmetric CN VIII: hearing intact CN IX, X: gag intact, uvula midline CN XI: sternocleidomastoid and trapezius muscles intact CN XII: tongue midline Bulk & Tone: normal, no fasciculations. Motor:  muscle strength 5/5 throughout Sensation:  Pinprick, temperature and vibratory sensation intact. Deep Tendon Reflexes:  3+ in both knees, otherwise 2+ throughout,  Hoffman sign absent, Babinski sign absent Finger to nose testing:  Without dysmetria.   Heel to shin:  Without dysmetria.   Gait:  Normal station and stride.  Able to turn, walk on toes, heels and in tandem.  Romberg negative.    Thank you for allowing me to take part in the care of this  patient.  Metta Clines, DO  CC: Debbrah Alar, NP

## 2021-09-29 ENCOUNTER — Encounter: Payer: Self-pay | Admitting: Neurology

## 2021-09-29 ENCOUNTER — Ambulatory Visit: Payer: 59 | Admitting: Neurology

## 2021-09-29 VITALS — BP 113/77 | HR 85 | Ht 64.0 in | Wt 188.4 lb

## 2021-09-29 DIAGNOSIS — R296 Repeated falls: Secondary | ICD-10-CM | POA: Diagnosis not present

## 2021-09-29 DIAGNOSIS — R29898 Other symptoms and signs involving the musculoskeletal system: Secondary | ICD-10-CM

## 2021-09-29 DIAGNOSIS — R200 Anesthesia of skin: Secondary | ICD-10-CM | POA: Diagnosis not present

## 2021-09-29 DIAGNOSIS — R292 Abnormal reflex: Secondary | ICD-10-CM

## 2021-09-29 NOTE — Patient Instructions (Signed)
To look for cause of falls and left leg weakness, will check MRI of cervical and thoracic spine.  If unremarkable, will check MRI of brain. The numbness in hands may be carpal tunnel syndrome.  Recommend buying and wearing a pair of wrist splints at night.  If no improvement, we can order a nerve conduction study of arms/hands Follow up after testing.

## 2021-10-08 DIAGNOSIS — F331 Major depressive disorder, recurrent, moderate: Secondary | ICD-10-CM | POA: Diagnosis not present

## 2021-10-08 DIAGNOSIS — F411 Generalized anxiety disorder: Secondary | ICD-10-CM | POA: Diagnosis not present

## 2021-10-26 ENCOUNTER — Other Ambulatory Visit (HOSPITAL_BASED_OUTPATIENT_CLINIC_OR_DEPARTMENT_OTHER): Payer: Self-pay

## 2021-10-30 ENCOUNTER — Ambulatory Visit: Payer: 59 | Admitting: Family

## 2021-11-30 DIAGNOSIS — F331 Major depressive disorder, recurrent, moderate: Secondary | ICD-10-CM | POA: Diagnosis not present

## 2021-11-30 DIAGNOSIS — F411 Generalized anxiety disorder: Secondary | ICD-10-CM | POA: Diagnosis not present

## 2021-12-08 ENCOUNTER — Ambulatory Visit: Payer: 59 | Admitting: Family

## 2021-12-08 ENCOUNTER — Encounter: Payer: Self-pay | Admitting: Family

## 2021-12-08 ENCOUNTER — Telehealth: Payer: Self-pay

## 2021-12-08 ENCOUNTER — Other Ambulatory Visit (HOSPITAL_BASED_OUTPATIENT_CLINIC_OR_DEPARTMENT_OTHER): Payer: Self-pay

## 2021-12-08 VITALS — BP 120/77 | HR 102 | Temp 97.5°F | Resp 16 | Ht 64.0 in | Wt 193.0 lb

## 2021-12-08 DIAGNOSIS — E039 Hypothyroidism, unspecified: Secondary | ICD-10-CM

## 2021-12-08 DIAGNOSIS — F418 Other specified anxiety disorders: Secondary | ICD-10-CM | POA: Diagnosis not present

## 2021-12-08 DIAGNOSIS — Z1159 Encounter for screening for other viral diseases: Secondary | ICD-10-CM | POA: Diagnosis not present

## 2021-12-08 DIAGNOSIS — N951 Menopausal and female climacteric states: Secondary | ICD-10-CM | POA: Diagnosis not present

## 2021-12-08 DIAGNOSIS — M2550 Pain in unspecified joint: Secondary | ICD-10-CM | POA: Diagnosis not present

## 2021-12-08 DIAGNOSIS — E669 Obesity, unspecified: Secondary | ICD-10-CM

## 2021-12-08 DIAGNOSIS — D509 Iron deficiency anemia, unspecified: Secondary | ICD-10-CM | POA: Diagnosis not present

## 2021-12-08 LAB — CBC WITH DIFFERENTIAL/PLATELET
Basophils Absolute: 0.1 10*3/uL (ref 0.0–0.1)
Basophils Relative: 1 % (ref 0.0–3.0)
Eosinophils Absolute: 0.1 10*3/uL (ref 0.0–0.7)
Eosinophils Relative: 1.6 % (ref 0.0–5.0)
HCT: 34.8 % — ABNORMAL LOW (ref 36.0–46.0)
Hemoglobin: 11.2 g/dL — ABNORMAL LOW (ref 12.0–15.0)
Lymphocytes Relative: 27.6 % (ref 12.0–46.0)
Lymphs Abs: 1.5 10*3/uL (ref 0.7–4.0)
MCHC: 32.2 g/dL (ref 30.0–36.0)
MCV: 84.6 fl (ref 78.0–100.0)
Monocytes Absolute: 0.5 10*3/uL (ref 0.1–1.0)
Monocytes Relative: 9.9 % (ref 3.0–12.0)
Neutro Abs: 3.3 10*3/uL (ref 1.4–7.7)
Neutrophils Relative %: 59.9 % (ref 43.0–77.0)
Platelets: 357 10*3/uL (ref 150.0–400.0)
RBC: 4.12 Mil/uL (ref 3.87–5.11)
RDW: 15.3 % (ref 11.5–15.5)
WBC: 5.6 10*3/uL (ref 4.0–10.5)

## 2021-12-08 LAB — TSH: TSH: 4.24 u[IU]/mL (ref 0.35–5.50)

## 2021-12-08 LAB — SEDIMENTATION RATE: Sed Rate: 11 mm/hr (ref 0–20)

## 2021-12-08 MED ORDER — WEGOVY 0.25 MG/0.5ML ~~LOC~~ SOAJ
0.2500 mg | SUBCUTANEOUS | 2 refills | Status: DC
  Filled 2021-12-08 – 2021-12-18 (×2): qty 2, 28d supply, fill #0
  Filled 2022-03-15: qty 2, 28d supply, fill #1
  Filled 2022-04-13: qty 2, 28d supply, fill #2

## 2021-12-08 NOTE — Assessment & Plan Note (Signed)
Irregular menses.  Could also be contributing to c/o weight gain and joint pain.

## 2021-12-08 NOTE — Telephone Encounter (Signed)
PA approved.    The request has been approved. The authorization is effective from 12/08/2021 to 06/29/2022, as long as the member is enrolled in their current health plan. The request was approved as submitted. The request was approved for 59m per 28 days.Additional prior authorizations (PA) have been entered [id number].'5mg'$ /0.555mwith a quantity limit of 11m36mer 28 days ([place] [address]16)Wegovy '1mg'$ /0.5mL71mth a quantity limit of 11mL 11m 28 days ([place] 20917)Wegovy 1.'7mg'$ /0.75mL 26m a quantity limit of 3mL pe74m8 days ([place] 20918)Wegovy 2.'4mg'$ /0.75mL wi45m quantity limit of 3mL per 57mdays ([place] 20919)These authorizations are effective 12/08/2021 through 06/29/2022. A written notification letter will follow with additional details.

## 2021-12-08 NOTE — Assessment & Plan Note (Signed)
She is interested in trying [facility]. Discussed risks/benefits/common side effects.  No personal/family hx of thyroid cancer. Rx sent for 0.'25mg'$  SQ weekly.

## 2021-12-08 NOTE — Assessment & Plan Note (Signed)
Stable on synthroid- continue same. Obtain follow up tsh.

## 2021-12-08 NOTE — Telephone Encounter (Signed)
PA initiated via Covermymeds; KEY: [id number]. Awaiting determination.

## 2021-12-08 NOTE — Progress Notes (Signed)
Subjective:   By signing my name below, I, Cheyenne Hawkins, attest that this documentation has been prepared under the direction and in the presence of Cheyenne Barrington, NP 12/08/2021   Patient ID: Cheyenne Hawkins, female    DOB: 1975/05/20, 46 y.o.   MRN: [id number]  Chief Complaint  Patient presents with   Joint Pain   Weight Management Screening    Gaining weight    HPI Patient is in today for an office visit   Iron: She is requesting to get her labs done because she believes that her iron levels are abnormal. She states that overall she feels off. She notes that she does not consistently take her multivitamins. She also notes of joint pain primarily in her hands. Symptoms are worse during the evening and while at work. 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 She notices that her knees and lower extremities swell while working. She occasionally wears compression socks.   Anemia/ Pre-menopause: She states that her cycle occurred at the beginning of November and the beginning of this week. She states that her cycle is most heaviest during the first two days. She wears a super tampon and pad which last her about 2-3 hours. She also states of breast growth and tenderness.   Mood Medications: She states that she went on vacation about a month ago and forgot her Lexapro and Wellbutrin medication. She has not started the medication since forgetting it. She states that as of right now, her mood is fine and she is not interested in restarting the medication at this time.   Weight Management: She expresses concerns with weight management. She notes of diabetes being prevalent in her family. For about a month, she has been participating in water aerobics about twice a week and ab roller about once a week. She is not maintaining a healthy diet. She has previously been to [facility] but states that it did not help her. She is interested in  trying [facility] but is concerned about the GI side effects.   Thyroid: She is currently taking one tablet of 150 mcg of Synthroid daily. Lab Results  Component Value Date   TSH 2.88 03/13/2021   Blood Pressure: She is currently taking one tablet of 2.5 mg of Amlodipine daily BP Readings from Last 3 Encounters:  12/08/21 120/77  09/29/21 113/77  05/01/21 115/76   Pulse Readings from Last 3 Encounters:  12/08/21 (!) 102  09/29/21 85  05/01/21 80   Immunizations: She is UTD on the influenza vaccine and reports that she received the vaccine sometime in October. She is interested in receiving a HepC screening.   Neurology/MRI: She was last seen by [facility] on 09/29/2021. She was referred to receive a MRI but has postponed it till next year due to cost.   Health Maintenance Due  Topic Date Due   Hepatitis C Screening  Never done   COVID-19 Vaccine (3 - 2023-24 season) 09/11/2021    Past Medical History:  Diagnosis Date   Anemia, iron deficiency 11/19/2011   Arthritis    Atrial fibrillation ([place])    with Graves disease    Blood transfusion without reported diagnosis    COVID-19 virus infection 08/15/2020   Depression 2012   Fatty liver 03/21/2011   GERD (gastroesophageal reflux disease)    Grave's disease 2002   s/p thyroidectomy   Heart murmur    MVP   History of  chicken pox    HSV-2 (herpes simplex virus 2) infection    Hypertension     Past Surgical History:  Procedure Laterality Date   CESAREAN SECTION  2001   CESAREAN SECTION  2003   CESAREAN SECTION  2007   COLONOSCOPY  2010   [place]  2003   for graves disease   TUBAL LIGATION      Family History  Problem Relation Age of Onset   Alcohol abuse Mother    Depression Mother    Bipolar disorder Mother    Alcohol abuse Father    Diabetes Father    Hypertension Father    Colon polyps Father    CVA Father    Osteoarthritis Father    Hypertension Sister    Depression Sister    ADD / ADHD  Brother    ADD / ADHD Brother    Cancer Maternal Grandmother        colon   Depression Maternal Grandmother    Diabetes Maternal Grandmother    Hypertension Maternal Grandmother    Colon cancer Maternal Grandmother    Colon polyps Maternal Grandmother    Cancer Paternal Grandmother        breast   Diabetes Paternal Grandmother    Hypertension Paternal Ecologist Allergies Daughter    Environmental Allergies Daughter    Environmental Allergies Son    Esophageal cancer Neg Hx    Rectal cancer Neg Hx    Stomach cancer Neg Hx     Social History   Socioeconomic History   Marital status: Married    Spouse name: Not on file   Number of children: 3   Years of education: Not on file   Highest education level: Not on file  Occupational History    Employer: [employer]  Tobacco Use   Smoking status: Never   Smokeless tobacco: Never  Vaping Use   Vaping Use: Never used  Substance and Sexual Activity   Alcohol use: Yes    Alcohol/week: 10.0 standard drinks of alcohol    Types: 10 Glasses of wine per week   Drug use: No   Sexual activity: Yes    Birth control/protection: Surgical    Comment: btl  Other Topics Concern   Not on file  Social History Narrative   Regular exercise:  NoCaffeine Use: 2 drinks weekly   Right handed    Social Determinants of Health   Financial Resource Strain: Not on file  Food Insecurity: Not on file  Transportation Needs: Not on file  Physical Activity: Not on file  Stress: Not on file  Social Connections: Not on file  Intimate Partner Violence: Not on file    Outpatient Medications Prior to Visit  Medication Sig Dispense Refill   amLODipine (NORVASC) 2.5 MG tablet TAKE 1 TABLET (2.5 MG TOTAL) BY MOUTH DAILY. 90 tablet 0   levothyroxine (SYNTHROID) 150 MCG tablet TAKE 1 TABLET (150 MCG TOTAL) BY MOUTH DAILY. 90 tablet 1   Multiple Vitamins-Minerals (MULTIVITAMIN WITH MINERALS) tablet Take 1 tablet by mouth daily.      valACYclovir (VALTREX) 1000 MG tablet Take 1 tablet by mouth once daily for 5 days as needed for outbreak 20 tablet 5   buPROPion (WELLBUTRIN XL) 150 MG 24 hr tablet TAKE 1 TABLET (150 MG TOTAL) BY MOUTH DAILY. 90 tablet 0   escitalopram (LEXAPRO) 20 MG tablet TAKE 1 TABLET BY MOUTH ONCE DAILY 90 tablet 1   No  facility-administered medications prior to visit.    No Known Allergies  ROS    See HPI Objective:    Physical Exam Constitutional:      General: She is not in acute distress.    Appearance: Normal appearance. She is not ill-appearing.  HENT:     Head: Normocephalic and atraumatic.     Right Ear: External ear normal.     Left Ear: External ear normal.  Eyes:     Extraocular Movements: Extraocular movements intact.     Pupils: Pupils are equal, round, and reactive to light.  Cardiovascular:     Rate and Rhythm: Normal rate and regular rhythm.     Heart sounds: Normal heart sounds. No murmur heard.    No gallop.  Pulmonary:     Effort: Pulmonary effort is normal. No respiratory distress.     Breath sounds: Normal breath sounds. No wheezing or rales.  Skin:    General: Skin is warm and dry.  Neurological:     Mental Status: She is alert and oriented to person, place, and time.  Psychiatric:        Mood and Affect: Mood normal.        Behavior: Behavior normal.        Judgment: Judgment normal.     BP 120/77 (BP Location: Right Arm, Patient Position: Sitting, Cuff Size: Small)   Pulse (!) 102   Temp (!) 97.5 F (36.4 C) (Oral)   Resp 16   Wt 193 lb (87.5 kg)   SpO2 100%   BMI 33.13 kg/m  Wt Readings from Last 3 Encounters:  12/08/21 193 lb (87.5 kg)  09/29/21 188 lb 6.4 oz (85.5 kg)  05/01/21 189 lb (85.7 kg)       Assessment & Plan:   Problem List Items Addressed This Visit       Unprioritized   Anemia, iron deficiency (Chronic)    Taking mvi intermittently.  Check cbc/iron levels.       Relevant Orders   CBC with Differential/Platelet   Iron,  TIBC and Ferritin Panel   Perimenopause    Irregular menses.  Could also be contributing to c/o weight gain and joint pain.       Obesity (BMI 30.0-34.9)    She is interested in trying [facility]. Discussed risks/benefits/common side effects.  No personal/family hx of thyroid cancer. Rx sent for 0.'25mg'$  SQ weekly.       Hypothyroidism    Stable on synthroid- continue same. Obtain follow up tsh.       Relevant Orders   TSH   Depression with anxiety    Stable even though she is off medication x 1 month. She is working with a Transport planner and is finding this helpful.       Arthralgia    New. Rule out autoimmune etiology.       Relevant Orders   ANA   Rheumatoid Factor   Sedimentation rate   Other Visit Diagnoses     Need for hepatitis C screening test    -  Primary   Relevant Orders   Hepatitis C Antibody      Meds ordered this encounter  Medications   Semaglutide-Weight Management (WEGOVY) 0.25 MG/0.5ML SOAJ    Sig: Inject 0.25 mg into the skin once a week.    Dispense:  2 mL    Refill:  2    Order Specific Question:   Supervising Provider    Answer:   Penni Homans A [  Niles, Nance Pear, NP, personally preformed the services described in this documentation.  All medical record entries made by the scribe were at my direction and in my presence.  I have reviewed the chart and discharge instructions (if applicable) and agree that the record reflects my personal performance and is accurate and complete. 12/08/2021   I,Amber Collins,acting as a scribe for Nance Pear, NP.,have documented all relevant documentation on the behalf of Nance Pear, NP,as directed by  Nance Pear, NP while in the presence of Nance Pear, NP.   Nance Pear, NP

## 2021-12-08 NOTE — Assessment & Plan Note (Addendum)
Stable even though she is off medication x 1 month. She is working with a Transport planner and is finding this helpful.

## 2021-12-08 NOTE — Assessment & Plan Note (Signed)
Taking mvi intermittently.  Check cbc/iron levels.

## 2021-12-08 NOTE — Assessment & Plan Note (Signed)
New. Rule out autoimmune etiology.

## 2021-12-11 LAB — HEPATITIS C ANTIBODY: Hepatitis C Ab: NONREACTIVE

## 2021-12-11 LAB — IRON,TIBC AND FERRITIN PANEL
%SAT: 14 % (calc) — ABNORMAL LOW (ref 16–45)
Ferritin: 7 ng/mL — ABNORMAL LOW (ref 16–232)
Iron: 54 ug/dL (ref 40–190)
TIBC: 385 mcg/dL (calc) (ref 250–450)

## 2021-12-11 LAB — RHEUMATOID FACTOR: Rheumatoid fact SerPl-aCnc: <14 IU/mL (ref <14)

## 2021-12-11 LAB — ANTI-NUCLEAR AB-TITER (ANA TITER): ANA Titer 1: 1:40 {titer} — ABNORMAL HIGH

## 2021-12-11 LAB — ANA: Anti Nuclear Antibody (ANA): POSITIVE — AB

## 2021-12-12 ENCOUNTER — Telehealth: Payer: Self-pay | Admitting: Family

## 2021-12-12 DIAGNOSIS — R768 Other specified abnormal immunological findings in serum: Secondary | ICD-10-CM

## 2021-12-12 MED ORDER — LEVOTHYROXINE SODIUM 25 MCG PO TABS
12.5000 ug | ORAL_TABLET | Freq: Every day | ORAL | 1 refills | Status: DC
  Filled 2021-12-12: qty 45, 90d supply, fill #0

## 2021-12-12 NOTE — Telephone Encounter (Signed)
Thyroid medication should be increased slightly-   Continue synthroid 150 mcg daily but add 1/2 of a '25mg'$  tab (12.5 mcg) once daily.    One of her autoimmune tests (ANA) is mildly elevated.  Given her joint pain, I would recommend that she see rheumatology. Referral has been placed.  Iron level is mildly low- continue mvi with minerals and focus on an iron rich diet.

## 2021-12-14 ENCOUNTER — Other Ambulatory Visit (HOSPITAL_BASED_OUTPATIENT_CLINIC_OR_DEPARTMENT_OTHER): Payer: Self-pay

## 2021-12-14 ENCOUNTER — Encounter: Payer: Self-pay | Admitting: Family

## 2021-12-14 NOTE — Telephone Encounter (Signed)
Patient advised of results, new medication and referral. She will continue multivit with minerals.

## 2021-12-18 ENCOUNTER — Other Ambulatory Visit (HOSPITAL_BASED_OUTPATIENT_CLINIC_OR_DEPARTMENT_OTHER): Payer: Self-pay

## 2021-12-23 ENCOUNTER — Other Ambulatory Visit: Payer: Self-pay | Admitting: Family

## 2021-12-23 ENCOUNTER — Other Ambulatory Visit (HOSPITAL_BASED_OUTPATIENT_CLINIC_OR_DEPARTMENT_OTHER): Payer: Self-pay

## 2021-12-23 MED ORDER — AMLODIPINE BESYLATE 2.5 MG PO TABS
2.5000 mg | ORAL_TABLET | Freq: Every day | ORAL | 0 refills | Status: DC
  Filled 2021-12-23: qty 90, 90d supply, fill #0

## 2021-12-28 ENCOUNTER — Other Ambulatory Visit (HOSPITAL_BASED_OUTPATIENT_CLINIC_OR_DEPARTMENT_OTHER): Payer: Self-pay

## 2021-12-30 NOTE — Progress Notes (Deleted)
NEUROLOGY FOLLOW UP OFFICE NOTE  Cheyenne Hawkins [id number]  Assessment/Plan:   History of falls - endorses possible left leg weakness.  No objective lateralizing findings on neurologic exam. She does exhibit hyperreflexia in the knees but this may be physiologic as other reflexes are normal and she does not exhibit pathologic reflexes.  Regardless, given her symptoms, would check MRI of cervical and thoracic spine without contrast to evaluate for spinal cord lesion to explain symptoms.  If unremarkable, would check MRI of brain.  Further recommendations pending results.      Subjective:  Cheyenne Hawkins is a 46 year old female with acquired thyroidism (history of Graves disease), HTN, generalized anxiety disorder, depression, HSV-2, who follows up for falls.  UPDATE: Had ordered MRIs of the spinal cord last visit.  ***   HISTORY: Reports feeling wobbly for about 2 years.  On average she would fall twice a year.  Symptoms are unchanged.  She feels like it occurs due to her left leg.  When she walks up stairs, it seems that her foot doesn't clear the step and she will trip and fall.  She has some low back pain but no radicular pain or numbness down the leg.  No bowel or bladder dysfunction.  She does report sometimes numbness in the hands, particularly when she wakes up in bed.  No double vision.  No known family history of neurologic disorders.  PAST MEDICAL HISTORY: Past Medical History:  Diagnosis Date   Anemia, iron deficiency 11/19/2011   Arthritis    Atrial fibrillation ([place])    with Graves disease    Blood transfusion without reported diagnosis    COVID-19 virus infection 08/15/2020   Depression 2012   Fatty liver 03/21/2011   GERD (gastroesophageal reflux disease)    Grave's disease 2002   s/p thyroidectomy   Heart murmur    MVP   History of chicken pox    HSV-2 (herpes simplex virus 2) infection    Hypertension     MEDICATIONS: Current Outpatient Medications on  File Prior to Visit  Medication Sig Dispense Refill   amLODipine (NORVASC) 2.5 MG tablet Take 1 tablet (2.5 mg total) by mouth daily. 90 tablet 0   levothyroxine (SYNTHROID) 150 MCG tablet TAKE 1 TABLET (150 MCG TOTAL) BY MOUTH DAILY. 90 tablet 1   levothyroxine (SYNTHROID) 25 MCG tablet Take 0.5 tablets (12.5 mcg total) by mouth daily. Take along with the '150mg'$  tablet of levothyroxine for a total of 162.39mg daily 45 tablet 1   Multiple Vitamins-Minerals (MULTIVITAMIN WITH MINERALS) tablet Take 1 tablet by mouth daily.     Semaglutide-Weight Management (WEGOVY) 0.25 MG/0.5ML SOAJ Inject 0.25 mg into the skin once a week. 2 mL 2   valACYclovir (VALTREX) 1000 MG tablet Take 1 tablet by mouth once daily for 5 days as needed for outbreak 20 tablet 5   No current facility-administered medications on file prior to visit.    ALLERGIES: No Known Allergies  FAMILY HISTORY: Family History  Problem Relation Age of Onset   Alcohol abuse Mother    Depression Mother    Bipolar disorder Mother    Alcohol abuse Father    Diabetes Father    Hypertension Father    Colon polyps Father    CVA Father    Osteoarthritis Father    Hypertension Sister    Depression Sister    ADD / ADHD Brother    ADD / ADHD Brother    Cancer Maternal Grandmother  colon   Depression Maternal Grandmother    Diabetes Maternal Grandmother    Hypertension Maternal Grandmother    Colon cancer Maternal Grandmother    Colon polyps Maternal Grandmother    Cancer Paternal Grandmother        breast   Diabetes Paternal Grandmother    Hypertension Paternal Grandmother    Environmental Allergies Daughter    Environmental Allergies Daughter    Environmental Allergies Son    Esophageal cancer Neg Hx    Rectal cancer Neg Hx    Stomach cancer Neg Hx       Objective:  *** General: No acute distress.  Patient appears ***-groomed.   Head:  Normocephalic/atraumatic Eyes:  Fundi examined but not visualized Neck:  supple, no paraspinal tenderness, full range of motion Heart:  Regular rate and rhythm Lungs:  Clear to auscultation bilaterally Back: No paraspinal tenderness Neurological Exam: alert and oriented to person, place, and time.  Speech fluent and not dysarthric, language intact.  CN II-XII intact. Bulk and tone normal, muscle strength 5/5 throughout.  Sensation to light touch intact.  Deep tendon reflexes 2+ throughout, toes downgoing.  Finger to nose testing intact.  Gait normal, Romberg negative.   Metta Clines, DO  CC: ***

## 2021-12-31 ENCOUNTER — Other Ambulatory Visit (HOSPITAL_BASED_OUTPATIENT_CLINIC_OR_DEPARTMENT_OTHER): Payer: Self-pay

## 2022-01-05 ENCOUNTER — Ambulatory Visit: Payer: 59 | Admitting: Neurology

## 2022-01-15 ENCOUNTER — Other Ambulatory Visit (HOSPITAL_BASED_OUTPATIENT_CLINIC_OR_DEPARTMENT_OTHER): Payer: Self-pay

## 2022-01-22 DIAGNOSIS — F411 Generalized anxiety disorder: Secondary | ICD-10-CM | POA: Diagnosis not present

## 2022-01-22 DIAGNOSIS — F331 Major depressive disorder, recurrent, moderate: Secondary | ICD-10-CM | POA: Diagnosis not present

## 2022-01-23 ENCOUNTER — Other Ambulatory Visit (HOSPITAL_BASED_OUTPATIENT_CLINIC_OR_DEPARTMENT_OTHER): Payer: Self-pay

## 2022-01-25 ENCOUNTER — Encounter: Payer: Self-pay | Admitting: Family

## 2022-01-26 ENCOUNTER — Other Ambulatory Visit (HOSPITAL_BASED_OUTPATIENT_CLINIC_OR_DEPARTMENT_OTHER): Payer: Self-pay

## 2022-01-26 MED ORDER — ESCITALOPRAM OXALATE 20 MG PO TABS
20.0000 mg | ORAL_TABLET | Freq: Every day | ORAL | 1 refills | Status: DC
  Filled 2022-01-26: qty 90, 90d supply, fill #0
  Filled 2022-04-21 – 2022-06-01 (×2): qty 90, 90d supply, fill #1

## 2022-01-26 MED ORDER — BUPROPION HCL ER (XL) 150 MG PO TB24
150.0000 mg | ORAL_TABLET | Freq: Every day | ORAL | 1 refills | Status: DC
  Filled 2022-01-26: qty 90, 90d supply, fill #0

## 2022-01-27 ENCOUNTER — Other Ambulatory Visit (HOSPITAL_BASED_OUTPATIENT_CLINIC_OR_DEPARTMENT_OTHER): Payer: Self-pay

## 2022-02-03 ENCOUNTER — Other Ambulatory Visit (HOSPITAL_BASED_OUTPATIENT_CLINIC_OR_DEPARTMENT_OTHER): Payer: Self-pay

## 2022-02-05 ENCOUNTER — Ambulatory Visit
Admission: RE | Admit: 2022-02-05 | Discharge: 2022-02-05 | Disposition: A | Discharge status: Home / Self Care | Payer: Commercial Managed Care - PPO | Source: Ambulatory Visit | Attending: Neurology | Admitting: Neurology

## 2022-02-05 DIAGNOSIS — R296 Repeated falls: Secondary | ICD-10-CM

## 2022-02-05 DIAGNOSIS — R292 Abnormal reflex: Secondary | ICD-10-CM

## 2022-02-05 DIAGNOSIS — R200 Anesthesia of skin: Secondary | ICD-10-CM | POA: Diagnosis not present

## 2022-02-05 DIAGNOSIS — M5124 Other intervertebral disc displacement, thoracic region: Secondary | ICD-10-CM | POA: Diagnosis not present

## 2022-02-05 DIAGNOSIS — M50223 Other cervical disc displacement at C6-C7 level: Secondary | ICD-10-CM | POA: Diagnosis not present

## 2022-02-05 DIAGNOSIS — R29898 Other symptoms and signs involving the musculoskeletal system: Secondary | ICD-10-CM

## 2022-02-05 DIAGNOSIS — M4802 Spinal stenosis, cervical region: Secondary | ICD-10-CM | POA: Diagnosis not present

## 2022-02-05 DIAGNOSIS — M47814 Spondylosis without myelopathy or radiculopathy, thoracic region: Secondary | ICD-10-CM | POA: Diagnosis not present

## 2022-02-08 NOTE — Progress Notes (Unsigned)
NEUROLOGY FOLLOW UP OFFICE NOTE  Cheyenne Hawkins [id number]  Assessment/Plan:   History of falls - endorses possible left leg weakness.  No objective lateralizing findings on neurologic exam. ***     Subjective:  Cheyenne Hawkins is a 47 year old female with acquired thyroidism (history of Graves disease), HTN, generalized anxiety disorder, depression, HSV-2, who follows up for falls.  UPDATE: MRI of cervical and thoracic spine on 02/05/2022 personally reviewed showed degenerative changes with broad central disc protrusion causing moderate spinal stenosis mildly compressing the cervical spinal cord but no cord edema and otherwise no spinal stenosis.     HISTORY: Reports feeling wobbly for about 2 years.  On average she would fall twice a year.  Symptoms are unchanged.  She feels like it occurs due to her left leg.  When she walks up stairs, it seems that her foot doesn't clear the step and she will trip and fall.  She has some low back pain but no radicular pain or numbness down the leg.  No bowel or bladder dysfunction.  She does report sometimes numbness in the hands, particularly when she wakes up in bed.  No double vision.  No known family history of neurologic disorders.  PAST MEDICAL HISTORY: Past Medical History:  Diagnosis Date   Anemia, iron deficiency 11/19/2011   Arthritis    Atrial fibrillation ([place])    with Graves disease    Blood transfusion without reported diagnosis    COVID-19 virus infection 08/15/2020   Depression 2012   Fatty liver 03/21/2011   GERD (gastroesophageal reflux disease)    Grave's disease 2002   s/p thyroidectomy   Heart murmur    MVP   History of chicken pox    HSV-2 (herpes simplex virus 2) infection    Hypertension     MEDICATIONS: Current Outpatient Medications on File Prior to Visit  Medication Sig Dispense Refill   amLODipine (NORVASC) 2.5 MG tablet Take 1 tablet (2.5 mg total) by mouth daily. 90 tablet 0   buPROPion (WELLBUTRIN XL)  150 MG 24 hr tablet Take 1 tablet (150 mg total) by mouth daily. 90 tablet 1   escitalopram (LEXAPRO) 20 MG tablet Take 1 tablet (20 mg total) by mouth daily. 90 tablet 1   levothyroxine (SYNTHROID) 150 MCG tablet TAKE 1 TABLET (150 MCG TOTAL) BY MOUTH DAILY. 90 tablet 1   levothyroxine (SYNTHROID) 25 MCG tablet Take 0.5 tablets (12.5 mcg total) by mouth daily. Take along with the '150mg'$  tablet of levothyroxine for a total of 162.68mg daily 45 tablet 1   Multiple Vitamins-Minerals (MULTIVITAMIN WITH MINERALS) tablet Take 1 tablet by mouth daily.     Semaglutide-Weight Management (WEGOVY) 0.25 MG/0.5ML SOAJ Inject 0.25 mg into the skin once a week. 2 mL 2   valACYclovir (VALTREX) 1000 MG tablet Take 1 tablet by mouth once daily for 5 days as needed for outbreak 20 tablet 5   No current facility-administered medications on file prior to visit.    ALLERGIES: No Known Allergies  FAMILY HISTORY: Family History  Problem Relation Age of Onset   Alcohol abuse Mother    Depression Mother    Bipolar disorder Mother    Alcohol abuse Father    Diabetes Father    Hypertension Father    Colon polyps Father    CVA Father    Osteoarthritis Father    Hypertension Sister    Depression Sister    ADD / ADHD Brother    ADD /  ADHD Brother    Cancer Maternal Grandmother        colon   Depression Maternal Grandmother    Diabetes Maternal Grandmother    Hypertension Maternal Grandmother    Colon cancer Maternal Grandmother    Colon polyps Maternal Grandmother    Cancer Paternal Grandmother        breast   Diabetes Paternal Grandmother    Hypertension Paternal Grandmother    Environmental Allergies Daughter    Environmental Allergies Daughter    Environmental Allergies Son    Esophageal cancer Neg Hx    Rectal cancer Neg Hx    Stomach cancer Neg Hx       Objective:  *** General: No acute distress.  Patient appears ***-groomed.   Head:  Normocephalic/atraumatic Eyes:  Fundi examined but not  visualized Neck: supple, no paraspinal tenderness, full range of motion Heart:  Regular rate and rhythm Lungs:  Clear to auscultation bilaterally Back: No paraspinal tenderness Neurological Exam: alert and oriented to person, place, and time.  Speech fluent and not dysarthric, language intact.  CN II-XII intact. Bulk and tone normal, muscle strength 5/5 throughout.  Sensation to light touch intact.  Deep tendon reflexes 2+ throughout, toes downgoing.  Finger to nose testing intact.  Gait normal, Romberg negative.   Metta Clines, DO  CC: Cheyenne Alar, NP

## 2022-02-09 ENCOUNTER — Encounter: Payer: Self-pay | Admitting: Neurology

## 2022-02-09 ENCOUNTER — Ambulatory Visit: Payer: Commercial Managed Care - PPO | Admitting: Neurology

## 2022-02-09 VITALS — BP 109/73 | HR 92 | Ht 64.0 in | Wt 196.0 lb

## 2022-02-09 DIAGNOSIS — W19XXXD Unspecified fall, subsequent encounter: Secondary | ICD-10-CM

## 2022-02-09 DIAGNOSIS — M4802 Spinal stenosis, cervical region: Secondary | ICD-10-CM

## 2022-02-09 NOTE — Patient Instructions (Signed)
I don't think the findings on the cervical MRI would be the cause of your symptoms.  However If you should have worsening or new symptoms of gait instability, bowel/bladder dysfunction, extremity weakness and numbness, please follow up with me.

## 2022-02-19 DIAGNOSIS — F331 Major depressive disorder, recurrent, moderate: Secondary | ICD-10-CM | POA: Diagnosis not present

## 2022-02-19 DIAGNOSIS — F411 Generalized anxiety disorder: Secondary | ICD-10-CM | POA: Diagnosis not present

## 2022-02-22 ENCOUNTER — Other Ambulatory Visit (HOSPITAL_BASED_OUTPATIENT_CLINIC_OR_DEPARTMENT_OTHER): Payer: Self-pay

## 2022-02-22 ENCOUNTER — Other Ambulatory Visit: Payer: Self-pay | Admitting: Family

## 2022-02-22 MED ORDER — LEVOTHYROXINE SODIUM 150 MCG PO TABS
150.0000 ug | ORAL_TABLET | Freq: Every day | ORAL | 1 refills | Status: DC
  Filled 2022-02-22: qty 90, 90d supply, fill #0

## 2022-02-26 DIAGNOSIS — F331 Major depressive disorder, recurrent, moderate: Secondary | ICD-10-CM | POA: Diagnosis not present

## 2022-02-26 DIAGNOSIS — F411 Generalized anxiety disorder: Secondary | ICD-10-CM | POA: Diagnosis not present

## 2022-03-10 ENCOUNTER — Telehealth: Payer: Commercial Managed Care - PPO | Admitting: Family

## 2022-03-10 VITALS — BP 104/63 | HR 111 | Wt 187.0 lb

## 2022-03-10 DIAGNOSIS — E669 Obesity, unspecified: Secondary | ICD-10-CM

## 2022-03-10 DIAGNOSIS — E039 Hypothyroidism, unspecified: Secondary | ICD-10-CM | POA: Diagnosis not present

## 2022-03-10 DIAGNOSIS — I10 Essential (primary) hypertension: Secondary | ICD-10-CM | POA: Insufficient documentation

## 2022-03-10 DIAGNOSIS — F418 Other specified anxiety disorders: Secondary | ICD-10-CM | POA: Diagnosis not present

## 2022-03-10 NOTE — Progress Notes (Signed)
MyChart Video Visit    Virtual Visit via Video Note     Patient location: Home Patient and provider in visit Provider location: Office  I discussed the limitations of evaluation and management by telemedicine and the availability of in person appointments. The patient expressed understanding and agreed to proceed.  Visit Date: 03/10/2022  Today's healthcare provider: Nance Pear, NP     Subjective:    Patient ID: Cheyenne Hawkins, female    DOB: [birth year], 47 y.o.   MRN: [id number]  Chief Complaint  Patient presents with   Hypothyroidism    Follow up   Hypertension    Follow up    HPI Patient is in today for a virtual follow-up appointment.   Blood pressure: Her blood pressure during this visit is 104/63 and her pulse is 111. She is taking 2.5 mg amlodipine daily PO to manage it.  BP Readings from Last 3 Encounters:  03/10/22 104/63  02/09/22 109/73  12/08/21 120/77    Pulse Readings from Last 3 Encounters:  03/10/22 (!) 111  02/09/22 92  12/08/21 (!) 102     Weight: She has lost weight since her las visit.  Wt Readings from Last 3 Encounters:  03/10/22 187 lb (84.8 kg)  02/09/22 196 lb (88.9 kg)  12/08/21 193 lb (87.5 kg)    Thyroid: Her thyroid levels are normal, but slightly elevated. She is taking 150 mcg synthroid daily PO to manage her levels.  Lab Results  Component Value Date   TSH 4.24 12/08/2021    Mood: She is taking 20 mg escitaprolam daily PO to stabilize her mood.  Rheumatology: Her AA levels are low. She is scheduling an appointment to see a rheumatologist.   Mancel Parsons: She is using 0.25 mg wegovy injections. She denies any nausea or diarrhea from the medication. Her appetite has decreased since taking it.    Past Medical History:  Diagnosis Date   Anemia, iron deficiency 11/19/2011   Arthritis    Atrial fibrillation ([place])    with Graves disease    Blood transfusion without reported diagnosis    COVID-19 virus infection  08/15/2020   Depression 2012   Fatty liver 03/21/2011   GERD (gastroesophageal reflux disease)    Grave's disease 2002   s/p thyroidectomy   Heart murmur    MVP   History of chicken pox    HSV-2 (herpes simplex virus 2) infection    Hypertension     Past Surgical History:  Procedure Laterality Date   CESAREAN SECTION  2001   CESAREAN SECTION  2003   CESAREAN SECTION  2007   COLONOSCOPY  2010   [place]  2003   for graves disease   TUBAL LIGATION      Family History  Problem Relation Age of Onset   Alcohol abuse Mother    Depression Mother    Bipolar disorder Mother    Alcohol abuse Father    Diabetes Father    Hypertension Father    Colon polyps Father    CVA Father    Osteoarthritis Father    Hypertension Sister    Depression Sister    ADD / ADHD Brother    ADD / ADHD Brother    Cancer Maternal Grandmother        colon   Depression Maternal Grandmother    Diabetes Maternal Grandmother    Hypertension Maternal Grandmother    Colon cancer Maternal Grandmother    Colon  polyps Maternal Grandmother    Cancer Paternal Grandmother        breast   Diabetes Paternal Grandmother    Hypertension Paternal Ecologist Allergies Daughter    Environmental Allergies Daughter    Environmental Allergies Son    Esophageal cancer Neg Hx    Rectal cancer Neg Hx    Stomach cancer Neg Hx     Social History   Socioeconomic History   Marital status: Married    Spouse name: Not on file   Number of children: 3   Years of education: Not on file   Highest education level: Not on file  Occupational History    Employer: [employer]  Tobacco Use   Smoking status: Never   Smokeless tobacco: Never  Vaping Use   Vaping Use: Never used  Substance and Sexual Activity   Alcohol use: Yes    Alcohol/week: 10.0 standard drinks of alcohol    Types: 10 Glasses of wine per week   Drug use: No   Sexual activity: Yes    Birth control/protection:  Surgical    Comment: btl  Other Topics Concern   Not on file  Social History Narrative   Regular exercise:  NoCaffeine Use: 2 drinks weekly   Right handed    Social Determinants of Health   Financial Resource Strain: Not on file  Food Insecurity: Not on file  Transportation Needs: Not on file  Physical Activity: Not on file  Stress: Not on file  Social Connections: Not on file  Intimate Partner Violence: Not on file    Outpatient Medications Prior to Visit  Medication Sig Dispense Refill   escitalopram (LEXAPRO) 20 MG tablet Take 1 tablet (20 mg total) by mouth daily. 90 tablet 1   levothyroxine (SYNTHROID) 150 MCG tablet Take 1 tablet (150 mcg total) by mouth daily. 90 tablet 1   levothyroxine (SYNTHROID) 25 MCG tablet Take 0.5 tablets (12.5 mcg total) by mouth daily. Take along with the '150mg'$  tablet of levothyroxine for a total of 162.46mg daily 45 tablet 1   Multiple Vitamins-Minerals (MULTIVITAMIN WITH MINERALS) tablet Take 1 tablet by mouth daily.     Semaglutide-Weight Management (WEGOVY) 0.25 MG/0.5ML SOAJ Inject 0.25 mg into the skin once a week. 2 mL 2   valACYclovir (VALTREX) 1000 MG tablet Take 1 tablet by mouth once daily for 5 days as needed for outbreak 20 tablet 5   amLODipine (NORVASC) 2.5 MG tablet Take 1 tablet (2.5 mg total) by mouth daily. 90 tablet 0   No facility-administered medications prior to visit.    No Known Allergies  ROS    See HPI  Objective:    Physical Exam Constitutional:      General: She is not in acute distress.    Appearance: Normal appearance. She is well-developed.  HENT:     Head: Normocephalic and atraumatic.     Right Ear: External ear normal.     Left Ear: External ear normal.  Eyes:     General: No scleral icterus. Pulmonary:     Effort: Pulmonary effort is normal.  Skin:    General: Skin is dry.  Neurological:     Mental Status: She is alert.  Psychiatric:        Mood and Affect: Mood normal.        Behavior:  Behavior normal.        Thought Content: Thought content normal.        Judgment: Judgment normal.  BP 104/63   Pulse (!) 111   Wt 187 lb (84.8 kg)   BMI 32.10 kg/m  Wt Readings from Last 3 Encounters:  03/10/22 187 lb (84.8 kg)  02/09/22 196 lb (88.9 kg)  12/08/21 193 lb (87.5 kg)       Assessment & Plan:  Hypothyroidism, unspecified type Assessment & Plan: Last visit we increased synthroid dosing. She is feeling well on current dose.  She will schedule visit for lab draw tsh.    Orders: -     TSH; Future -     Basic metabolic panel; Future  Essential hypertension Assessment & Plan: BP Readings from Last 3 Encounters:  03/10/22 104/63  02/09/22 109/73  12/08/21 120/77   BP is stable- a bit overtreated since she has lost weight. Recommended that she stop amlodipine and call us with some updated readings off of amlodipine  Orders: -     Basic metabolic panel  Depression with anxiety Assessment & Plan: Mood is stable on lexapro '20mg'$ . Continue same. She continues counseling.    Obesity (BMI 30.0-34.9) Assessment & Plan: Wt Readings from Last 3 Encounters:  03/10/22 187 lb (84.8 kg)  02/09/22 196 lb (88.9 kg)  12/08/21 193 lb (87.5 kg)   She has lost weight with [id number] continue at current dose since she is doing well on this.       I discussed the assessment and treatment plan with the patient. The patient was provided an opportunity to ask questions and all were answered. The patient agreed with the plan and demonstrated an understanding of the instructions.   The patient was advised to call back or seek an in-person evaluation if the symptoms worsen or if the condition fails to improve as anticipated.  Nance Pear, NP [facility] (phone) [phone] (fax)  [place],acting as a scribe for Nance Pear, NP.,have documented all relevant  documentation on the behalf of Nance Pear, NP,as directed by  Nance Pear, NP while in the presence of Nance Pear, NP.

## 2022-03-10 NOTE — Assessment & Plan Note (Signed)
Mood is stable on lexapro '20mg'$ . Continue same. She continues counseling.

## 2022-03-10 NOTE — Assessment & Plan Note (Signed)
BP Readings from Last 3 Encounters:  03/10/22 104/63  02/09/22 109/73  12/08/21 120/77   BP is stable- a bit overtreated since she has lost weight. Recommended that she stop amlodipine and call us with some updated readings off of amlodipine

## 2022-03-10 NOTE — Assessment & Plan Note (Signed)
Wt Readings from Last 3 Encounters:  03/10/22 187 lb (84.8 kg)  02/09/22 196 lb (88.9 kg)  12/08/21 193 lb (87.5 kg)   She has lost weight with [id number]- continue at current dose since she is doing well on this.

## 2022-03-10 NOTE — Assessment & Plan Note (Addendum)
Last visit we increased synthroid dosing. She is feeling well on current dose.  She will schedule visit for lab draw tsh.

## 2022-03-12 DIAGNOSIS — F331 Major depressive disorder, recurrent, moderate: Secondary | ICD-10-CM | POA: Diagnosis not present

## 2022-03-12 DIAGNOSIS — F411 Generalized anxiety disorder: Secondary | ICD-10-CM | POA: Diagnosis not present

## 2022-03-15 ENCOUNTER — Other Ambulatory Visit (HOSPITAL_BASED_OUTPATIENT_CLINIC_OR_DEPARTMENT_OTHER): Payer: Self-pay

## 2022-03-16 ENCOUNTER — Telehealth: Payer: Self-pay | Admitting: Family

## 2022-03-16 DIAGNOSIS — E039 Hypothyroidism, unspecified: Secondary | ICD-10-CM

## 2022-03-16 DIAGNOSIS — I10 Essential (primary) hypertension: Secondary | ICD-10-CM

## 2022-03-16 NOTE — Telephone Encounter (Signed)
Please schedule patient to schedule a lab appointment.

## 2022-03-17 NOTE — Telephone Encounter (Signed)
Orders changed to be drawn at [facility].

## 2022-03-17 NOTE — Telephone Encounter (Signed)
Pt scheduled for tomorrow at 8:15 but two of the labs were sent to [place] walk in. Are you wanting her to just get one done here or all 3 of them?

## 2022-03-18 ENCOUNTER — Other Ambulatory Visit: Payer: Commercial Managed Care - PPO

## 2022-03-19 ENCOUNTER — Other Ambulatory Visit (INDEPENDENT_AMBULATORY_CARE_PROVIDER_SITE_OTHER): Payer: Commercial Managed Care - PPO

## 2022-03-19 ENCOUNTER — Telehealth: Payer: Self-pay | Admitting: Family

## 2022-03-19 DIAGNOSIS — I10 Essential (primary) hypertension: Secondary | ICD-10-CM | POA: Diagnosis not present

## 2022-03-19 DIAGNOSIS — E039 Hypothyroidism, unspecified: Secondary | ICD-10-CM | POA: Diagnosis not present

## 2022-03-19 LAB — BASIC METABOLIC PANEL
BUN: 9 mg/dL (ref 6–23)
CO2: 28 mEq/L (ref 19–32)
Calcium: 9.5 mg/dL (ref 8.4–10.5)
Chloride: 101 mEq/L (ref 96–112)
Creatinine, Ser: 0.61 mg/dL (ref 0.40–1.20)
GFR: 105.93 mL/min (ref >60.00)
Glucose, Bld: 95 mg/dL (ref 70–99)
Potassium: 4.2 mEq/L (ref 3.5–5.1)
Sodium: 137 mEq/L (ref 135–145)

## 2022-03-19 LAB — TSH: TSH: 0.2 u[IU]/mL — ABNORMAL LOW (ref 0.35–5.50)

## 2022-03-19 NOTE — Telephone Encounter (Signed)
See mychart.  

## 2022-03-23 DIAGNOSIS — F331 Major depressive disorder, recurrent, moderate: Secondary | ICD-10-CM | POA: Diagnosis not present

## 2022-03-23 DIAGNOSIS — F411 Generalized anxiety disorder: Secondary | ICD-10-CM | POA: Diagnosis not present

## 2022-04-01 ENCOUNTER — Encounter: Payer: Self-pay | Admitting: Family

## 2022-04-02 ENCOUNTER — Other Ambulatory Visit (HOSPITAL_BASED_OUTPATIENT_CLINIC_OR_DEPARTMENT_OTHER): Payer: Self-pay

## 2022-04-02 MED ORDER — LEVOTHYROXINE SODIUM 137 MCG PO TABS
137.0000 ug | ORAL_TABLET | Freq: Every day | ORAL | 1 refills | Status: DC
  Filled 2022-04-02: qty 90, 90d supply, fill #0

## 2022-04-07 ENCOUNTER — Encounter: Payer: Self-pay | Admitting: Family

## 2022-04-07 DIAGNOSIS — F411 Generalized anxiety disorder: Secondary | ICD-10-CM | POA: Diagnosis not present

## 2022-04-07 DIAGNOSIS — F331 Major depressive disorder, recurrent, moderate: Secondary | ICD-10-CM | POA: Diagnosis not present

## 2022-04-07 DIAGNOSIS — Z111 Encounter for screening for respiratory tuberculosis: Secondary | ICD-10-CM

## 2022-04-08 ENCOUNTER — Other Ambulatory Visit (INDEPENDENT_AMBULATORY_CARE_PROVIDER_SITE_OTHER): Payer: Commercial Managed Care - PPO

## 2022-04-08 DIAGNOSIS — Z111 Encounter for screening for respiratory tuberculosis: Secondary | ICD-10-CM

## 2022-04-10 LAB — QUANTIFERON-TB GOLD PLUS
Mitogen-NIL: >10 IU/mL
NIL: 0.04 IU/mL
QuantiFERON-TB Gold Plus: NEGATIVE
TB1-NIL: 0 IU/mL
TB2-NIL: 0 IU/mL

## 2022-04-14 ENCOUNTER — Other Ambulatory Visit (HOSPITAL_BASED_OUTPATIENT_CLINIC_OR_DEPARTMENT_OTHER): Payer: Self-pay

## 2022-04-19 ENCOUNTER — Other Ambulatory Visit (HOSPITAL_BASED_OUTPATIENT_CLINIC_OR_DEPARTMENT_OTHER): Payer: Self-pay

## 2022-04-19 ENCOUNTER — Ambulatory Visit (INDEPENDENT_AMBULATORY_CARE_PROVIDER_SITE_OTHER): Payer: Commercial Managed Care - PPO | Admitting: Family

## 2022-04-19 ENCOUNTER — Encounter: Payer: Self-pay | Admitting: Family

## 2022-04-19 VITALS — BP 113/86 | HR 80 | Temp 97.8°F | Resp 16 | Ht 64.0 in | Wt 184.0 lb

## 2022-04-19 DIAGNOSIS — Z1231 Encounter for screening mammogram for malignant neoplasm of breast: Secondary | ICD-10-CM

## 2022-04-19 DIAGNOSIS — M7021 Olecranon bursitis, right elbow: Secondary | ICD-10-CM | POA: Diagnosis not present

## 2022-04-19 DIAGNOSIS — Z0000 Encounter for general adult medical examination without abnormal findings: Secondary | ICD-10-CM | POA: Diagnosis not present

## 2022-04-19 DIAGNOSIS — E039 Hypothyroidism, unspecified: Secondary | ICD-10-CM | POA: Diagnosis not present

## 2022-04-19 DIAGNOSIS — E669 Obesity, unspecified: Secondary | ICD-10-CM | POA: Diagnosis not present

## 2022-04-19 DIAGNOSIS — E611 Iron deficiency: Secondary | ICD-10-CM

## 2022-04-19 LAB — LIPID PANEL
Cholesterol: 127 mg/dL (ref 0–200)
HDL: 53.3 mg/dL (ref >39.00)
LDL Cholesterol: 64 mg/dL (ref 0–99)
NonHDL: 74.14
Total CHOL/HDL Ratio: 2
Triglycerides: 49 mg/dL (ref 0.0–149.0)
VLDL: 9.8 mg/dL (ref 0.0–40.0)

## 2022-04-19 LAB — TSH: TSH: 0.34 u[IU]/mL — ABNORMAL LOW (ref 0.35–5.50)

## 2022-04-19 MED ORDER — WEGOVY 0.5 MG/0.5ML ~~LOC~~ SOAJ
0.5000 mg | SUBCUTANEOUS | 0 refills | Status: DC
  Filled 2022-04-19 – 2022-05-07 (×4): qty 2, 28d supply, fill #0
  Filled 2022-06-01: qty 2, 28d supply, fill #1

## 2022-04-19 MED ORDER — BUPROPION HCL ER (XL) 150 MG PO TB24: 150.0000 mg | ORAL_TABLET | Freq: Every day | ORAL | Status: DC

## 2022-04-19 MED ORDER — MELOXICAM 7.5 MG PO TABS
7.5000 mg | ORAL_TABLET | Freq: Every day | ORAL | 0 refills | Status: DC
  Filled 2022-04-19: qty 14, 14d supply, fill #0

## 2022-04-19 NOTE — Assessment & Plan Note (Addendum)
Recommended icing, short trial of meloxicam. Call if symptoms are not improved in 2 weeks and will plan sports medicine referral at that time. [place]o up to date.

## 2022-04-19 NOTE — Assessment & Plan Note (Signed)
Discussed healthy diet, exercise and weight loss. She plans to schedule eye exam. She will schedule mammogram.

## 2022-04-19 NOTE — Assessment & Plan Note (Signed)
Her insurance will not be covering weight loss medications after the end of the month.  Will send a 3 month supply of increased dose of wegovy to her pharmacy.  Wt Readings from Last 3 Encounters:  04/19/22 184 lb (83.5 kg)  03/10/22 187 lb (84.8 kg)  02/09/22 196 lb (88.9 kg)

## 2022-04-19 NOTE — Progress Notes (Signed)
Subjective:   By signing my name below, I, Cheyenne Hawkins, attest that this documentation has been prepared under the direction and in the presence of Cheyenne Craze, NP. 04/19/2022   Patient ID: Cheyenne Hawkins, female    DOB: [birth year], 47 y.o.   MRN: [id number]  Chief Complaint  Patient presents with   Annual Exam    HPI Patient is in today for a comprehensive physical exam.   Left elbow: She is still experiencing pain in her left elbow. It causes her to wake up during the night when she is trying to sleep.   Synthroid: She is taking 137 mcg synthroid daily PO for her thyroid.   Stress: Her stress is intermittent from work and school, but no reports new or worsening stress. She is taking 150 mg Wellbutrin daily PO and 20 mg Lexapro daily PO to manage her mood.  Iron: Her iron levels were slightly low during her last lab work.   Acute: She denies fever, unexpected weight change, adenopathy, new moles, sinus pain, sore throat, visual disturbance, chest pain, palpitations, leg swelling, cough, shortness of breath, wheezing, nausea, vomiting, diarrhea, constipation, blood in stool, dysuria, frequency, hematuria, new muscle pain, new joint pain, headaches, depression or anxiety at this time.   Colonoscopy: Last colonoscopy completed on 06/24/2020. A 3mm polyp found on the transverse colon and diverticulosis found in the sigmoid colon, otherwise results are normal. Repeat in 10 years.  Mammogram: Last mammogram completed on 01/23/2021. Results are normal. Repeat in 1 year.   Social history: No recent surgeries in the last year. No changes in family history. She drinks 2-3 alcoholic beverages per week. She does not use any tobacco or vaping problems. She does not use any drugs.   Immunizations: She is UTD on her tetanus immunization. She is UTD on the influenza immunization. She is not yet UTD on the latest Covid-19 immunization.  Diet: She is following a balanced diet.    Exercise: She does not exercise regularly.  Dental: Her dental is UTD.   Vision: She is planning on scheduling an appointment for vision.   Past Medical History:  Diagnosis Date   Anemia, iron deficiency 11/19/2011   Arthritis    Atrial fibrillation    with Graves disease    Blood transfusion without reported diagnosis    COVID-19 virus infection 08/15/2020   Depression 2012   Fatty liver 03/21/2011   GERD (gastroesophageal reflux disease)    Grave's disease 2002   s/p thyroidectomy   Heart murmur    MVP   History of chicken pox    HSV-2 (herpes simplex virus 2) infection    Hypertension     Past Surgical History:  Procedure Laterality Date   CESAREAN SECTION  2001   CESAREAN SECTION  2003   CESAREAN SECTION  2007   COLONOSCOPY  2010   [place]    THYROIDECTOMY  2003   for graves disease   TUBAL LIGATION      Family History  Problem Relation Age of Onset   Alcohol abuse Mother    Depression Mother    Bipolar disorder Mother    Alcohol abuse Father    Diabetes Father    Hypertension Father    Colon polyps Father    CVA Father    Osteoarthritis Father    Memory loss Father    Hypertension Sister    Depression Sister    ADD / ADHD Brother    ADD / ADHD Brother  Cancer Maternal Grandmother        colon   Depression Maternal Grandmother    Diabetes Maternal Grandmother    Hypertension Maternal Grandmother    Colon cancer Maternal Grandmother    Colon polyps Maternal Grandmother    Cancer Paternal Grandmother        breast   Diabetes Paternal Grandmother    Hypertension Paternal Industrial/product designerGrandmother    Environmental Allergies Daughter    Environmental Allergies Daughter    Environmental Allergies Son    Esophageal cancer Neg Hx    Rectal cancer Neg Hx    Stomach cancer Neg Hx     Social History   Socioeconomic History   Marital status: Married    Spouse name: Not on file   Number of children: 3   Years of education: Not on file   Highest education  level: Not on file  Occupational History    Employer: [employer]  Tobacco Use   Smoking status: Never   Smokeless tobacco: Never  Vaping Use   Vaping Use: Never used  Substance and Sexual Activity   Alcohol use: Yes    Alcohol/week: 3.0 standard drinks of alcohol    Types: 3 Glasses of wine per week   Drug use: No   Sexual activity: Yes    Birth control/protection: Surgical    Comment: btl  Other Topics Concern   Not on file  Social History Narrative   Regular exercise:  No Caffeine Use: 2 drinks weekly   Right handed    Social Determinants of Health   Financial Resource Strain: Not on file  Food Insecurity: Not on file  Transportation Needs: Not on file  Physical Activity: Not on file  Stress: Not on file  Social Connections: Not on file  Intimate Partner Violence: Not on file    Outpatient Medications Prior to Visit  Medication Sig Dispense Refill   escitalopram (LEXAPRO) 20 MG tablet Take 1 tablet (20 mg total) by mouth daily. 90 tablet 1   levothyroxine (SYNTHROID) 137 MCG tablet Take 1 tablet (137 mcg total) by mouth daily before breakfast. 90 tablet 1   Multiple Vitamins-Minerals (MULTIVITAMIN WITH MINERALS) tablet Take 1 tablet by mouth daily.     valACYclovir (VALTREX) 1000 MG tablet Take 1 tablet by mouth once daily for 5 days as needed for outbreak 20 tablet 5   Semaglutide-Weight Management (WEGOVY) 0.25 MG/0.5ML SOAJ Inject 0.25 mg into the skin once a week. 2 mL 2   No facility-administered medications prior to visit.    No Known Allergies  Review of Systems  Constitutional:  Negative for fever.       (-) unexpected weight changes  HENT:  Negative for sinus pain and sore throat.   Eyes:        (-) visual disturbance  Respiratory:  Negative for cough, shortness of breath and wheezing.   Cardiovascular:  Negative for chest pain, palpitations and leg swelling.  Gastrointestinal:  Negative for blood in stool, constipation, diarrhea, nausea and vomiting.   Genitourinary:  Negative for dysuria, frequency and hematuria.  Musculoskeletal:  Positive for joint pain (left elbow).       (-) new muscle pain (-) new joint pain  Skin:        (-) new moles  Neurological:  Negative for headaches.  Psychiatric/Behavioral:  Negative for depression.        (-) anxiety       Objective:    Physical Exam Constitutional:  General: She is not in acute distress.    Appearance: Normal appearance.  HENT:     Head: Normocephalic and atraumatic.     Right Ear: Tympanic membrane, ear canal and external ear normal.     Left Ear: Tympanic membrane, ear canal and external ear normal.  Eyes:     Extraocular Movements: Extraocular movements intact.     Right eye: No nystagmus.     Left eye: No nystagmus.     Pupils: Pupils are equal, round, and reactive to light.  Cardiovascular:     Rate and Rhythm: Normal rate and regular rhythm.     Heart sounds: Normal heart sounds. No murmur heard.    No gallop.  Pulmonary:     Effort: Pulmonary effort is normal. No respiratory distress.     Breath sounds: Normal breath sounds. No wheezing or rales.  Abdominal:     General: Bowel sounds are normal. There is no distension.     Palpations: Abdomen is soft.     Tenderness: There is no abdominal tenderness. There is no guarding.  Musculoskeletal:        General: Normal range of motion.     Cervical back: Normal range of motion.     Comments: (+) 5/5 strength on upper and lower extremities  Lymphadenopathy:     Cervical: No cervical adenopathy.  Skin:    General: Skin is warm.  Neurological:     Mental Status: She is alert and oriented to person, place, and time.     Deep Tendon Reflexes:     Reflex Scores:      Patellar reflexes are 2+ on the right side and 2+ on the left side. Psychiatric:        Judgment: Judgment normal.     BP 113/86 (BP Location: Right Arm, Patient Position: Sitting, Cuff Size: Small)   Pulse 80   Temp 97.8 F (36.6 C) (Oral)    Resp 16   Ht 5\' 4"  (1.626 m)   Wt 184 lb (83.5 kg)   SpO2 99%   BMI 31.58 kg/m  Wt Readings from Last 3 Encounters:  04/19/22 184 lb (83.5 kg)  03/10/22 187 lb (84.8 kg)  02/09/22 196 lb (88.9 kg)       Assessment & Plan:  Preventative health care Assessment & Plan: Discussed healthy diet, exercise and weight loss. She plans to schedule eye exam. She will schedule mammogram.   Orders: -     Lipid panel  Breast cancer screening by mammogram -     3D Screening Mammogram, Left and Right; Future  Hypothyroidism, unspecified type Assessment & Plan: Last visit TSH was low and we decreased her synthroid. Will repeat TSH today.  Orders: -     TSH  Iron deficiency -     Iron, TIBC and Ferritin Panel  Obesity (BMI 30.0-34.9) Assessment & Plan: Her insurance will not be covering weight loss medications after the end of the month.  Will send a 3 month supply of increased dose of wegovy to her pharmacy.  Wt Readings from Last 3 Encounters:  04/19/22 184 lb (83.5 kg)  03/10/22 187 lb (84.8 kg)  02/09/22 196 lb (88.9 kg)      Olecranon bursitis of right elbow Assessment & Plan: Recommended icing, short trial of meloxicam. Call if symptoms are not improved in 2 weeks and will plan sports medicine referral at that time. Colo up to date.    Other orders -  Wegovy; Inject 0.5 mg into the skin once a week.  Dispense: 6 mL; Refill: 0 -     buPROPion HCl ER (XL); Take 1 tablet (150 mg total) by mouth daily. -     Meloxicam; Take 1 tablet (7.5 mg total) by mouth daily.  Dispense: 14 tablet; Refill: 0    I, Lemont Fillers, NP, personally preformed the services described in this documentation.  All medical record entries made by the scribe were at my direction and in my presence.  I have reviewed the chart and discharge instructions (if applicable) and agree that the record reflects my personal performance and is accurate and complete. 04/19/2022  Lemont Fillers,  NP   [place] as a scribe for Lemont Fillers, NP.,have documented all relevant documentation on the behalf of Lemont Fillers, NP,as directed by  Lemont Fillers, NP while in the presence of Lemont Fillers, NP.

## 2022-04-19 NOTE — Assessment & Plan Note (Signed)
Last visit TSH was low and we decreased her synthroid. Will repeat TSH today.

## 2022-04-20 ENCOUNTER — Telehealth: Payer: Self-pay | Admitting: Family

## 2022-04-20 ENCOUNTER — Other Ambulatory Visit (HOSPITAL_BASED_OUTPATIENT_CLINIC_OR_DEPARTMENT_OTHER): Payer: Self-pay

## 2022-04-20 DIAGNOSIS — E039 Hypothyroidism, unspecified: Secondary | ICD-10-CM

## 2022-04-20 LAB — IRON,TIBC AND FERRITIN PANEL
%SAT: 11 % (calc) — ABNORMAL LOW (ref 16–45)
Ferritin: 6 ng/mL — ABNORMAL LOW (ref 16–232)
Iron: 48 ug/dL (ref 40–190)
TIBC: 422 mcg/dL (calc) (ref 250–450)

## 2022-04-20 MED ORDER — LEVOTHYROXINE SODIUM 125 MCG PO TABS
125.0000 ug | ORAL_TABLET | Freq: Every day | ORAL | 0 refills | Status: DC
  Filled 2022-04-20: qty 90, 90d supply, fill #0

## 2022-04-20 MED ORDER — IRON (FERROUS SULFATE) 325 (65 FE) MG PO TABS: 325.0000 mg | ORAL_TABLET | ORAL | Status: DC

## 2022-04-20 NOTE — Telephone Encounter (Signed)
Please advise pt that her thyroid testing is improving but I would still like to decrease her synthroid further down to 125 mcg. Repeat TSH in 6 weeks. Ferritin level is low. Please add iron 325mg  one tablet by mouth every other day.

## 2022-04-20 NOTE — Telephone Encounter (Signed)
Called patient but no answer, left voice mail for patient to call back.   

## 2022-04-21 ENCOUNTER — Other Ambulatory Visit (HOSPITAL_BASED_OUTPATIENT_CLINIC_OR_DEPARTMENT_OTHER): Payer: Self-pay

## 2022-04-21 ENCOUNTER — Other Ambulatory Visit: Payer: Self-pay

## 2022-04-21 NOTE — Telephone Encounter (Signed)
Called and lvm again, called work but patient starts at 7 pm

## 2022-04-21 NOTE — Telephone Encounter (Signed)
MyChart message sent with the information

## 2022-04-23 ENCOUNTER — Other Ambulatory Visit (HOSPITAL_BASED_OUTPATIENT_CLINIC_OR_DEPARTMENT_OTHER): Payer: Self-pay

## 2022-04-29 ENCOUNTER — Other Ambulatory Visit (HOSPITAL_BASED_OUTPATIENT_CLINIC_OR_DEPARTMENT_OTHER): Payer: Self-pay

## 2022-05-03 ENCOUNTER — Inpatient Hospital Stay (HOSPITAL_BASED_OUTPATIENT_CLINIC_OR_DEPARTMENT_OTHER): Admission: RE | Admit: 2022-05-03 | Payer: Commercial Managed Care - PPO | Source: Ambulatory Visit

## 2022-05-05 DIAGNOSIS — F331 Major depressive disorder, recurrent, moderate: Secondary | ICD-10-CM | POA: Diagnosis not present

## 2022-05-05 DIAGNOSIS — F411 Generalized anxiety disorder: Secondary | ICD-10-CM | POA: Diagnosis not present

## 2022-05-07 ENCOUNTER — Other Ambulatory Visit (HOSPITAL_BASED_OUTPATIENT_CLINIC_OR_DEPARTMENT_OTHER): Payer: Self-pay

## 2022-05-07 ENCOUNTER — Other Ambulatory Visit: Payer: Self-pay | Admitting: Family

## 2022-05-07 MED ORDER — IRON (FERROUS SULFATE) 325 (65 FE) MG PO TABS: 325.0000 mg | ORAL_TABLET | ORAL | 1 refills | Status: DC

## 2022-05-10 ENCOUNTER — Other Ambulatory Visit (HOSPITAL_BASED_OUTPATIENT_CLINIC_OR_DEPARTMENT_OTHER): Payer: Self-pay

## 2022-05-17 ENCOUNTER — Other Ambulatory Visit (HOSPITAL_BASED_OUTPATIENT_CLINIC_OR_DEPARTMENT_OTHER): Payer: Self-pay

## 2022-05-18 ENCOUNTER — Other Ambulatory Visit (HOSPITAL_BASED_OUTPATIENT_CLINIC_OR_DEPARTMENT_OTHER): Payer: Self-pay

## 2022-05-25 DIAGNOSIS — F411 Generalized anxiety disorder: Secondary | ICD-10-CM | POA: Diagnosis not present

## 2022-05-25 DIAGNOSIS — F331 Major depressive disorder, recurrent, moderate: Secondary | ICD-10-CM | POA: Diagnosis not present

## 2022-05-27 ENCOUNTER — Other Ambulatory Visit (HOSPITAL_BASED_OUTPATIENT_CLINIC_OR_DEPARTMENT_OTHER): Payer: Self-pay

## 2022-05-31 ENCOUNTER — Ambulatory Visit (HOSPITAL_BASED_OUTPATIENT_CLINIC_OR_DEPARTMENT_OTHER): Payer: Commercial Managed Care - PPO

## 2022-06-01 ENCOUNTER — Other Ambulatory Visit (HOSPITAL_BASED_OUTPATIENT_CLINIC_OR_DEPARTMENT_OTHER): Payer: Self-pay

## 2022-06-01 ENCOUNTER — Other Ambulatory Visit: Payer: Self-pay | Admitting: Family

## 2022-06-01 ENCOUNTER — Other Ambulatory Visit: Payer: Self-pay

## 2022-06-01 MED ORDER — BUPROPION HCL ER (XL) 150 MG PO TB24
150.0000 mg | ORAL_TABLET | Freq: Every day | ORAL | 1 refills | Status: DC
  Filled 2022-06-01: qty 90, 90d supply, fill #0
  Filled 2022-09-24: qty 90, 90d supply, fill #1

## 2022-06-01 MED ORDER — VALACYCLOVIR HCL 1 G PO TABS
1000.0000 mg | ORAL_TABLET | Freq: Every day | ORAL | 5 refills | Status: DC | PRN
  Filled 2022-06-01: qty 20, 20d supply, fill #0
  Filled 2022-12-16: qty 20, 20d supply, fill #1
  Filled 2023-04-25: qty 20, 20d supply, fill #2

## 2022-06-04 ENCOUNTER — Other Ambulatory Visit (HOSPITAL_BASED_OUTPATIENT_CLINIC_OR_DEPARTMENT_OTHER): Payer: Self-pay

## 2022-06-06 IMAGING — MG MM DIGITAL SCREENING BILAT W/ TOMO AND CAD
8 series · 8 of 24 positions shown · non-contrast
Comparison: Previous exam(s).

CLINICAL DATA: Screening.

EXAM:
DIGITAL SCREENING BILATERAL MAMMOGRAM WITH TOMOSYNTHESIS AND CAD
TECHNIQUE: Bilateral screening digital craniocaudal and mediolateral oblique
mammograms were obtained. Bilateral screening digital breast
tomosynthesis was performed. The images were evaluated with
computer-aided detection.

[R CC synth-2D]
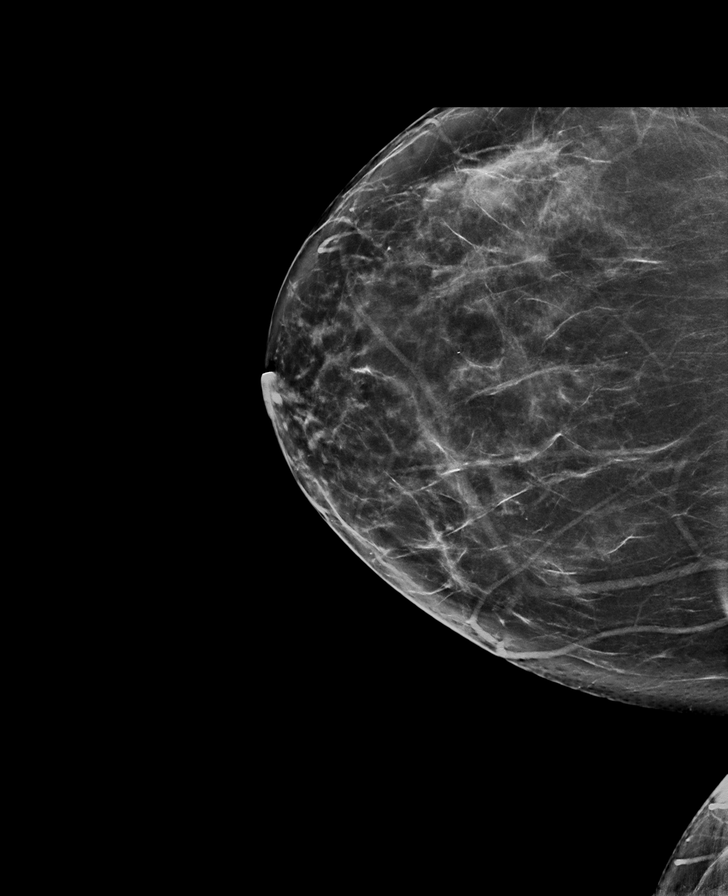

[R MLO synth-2D]
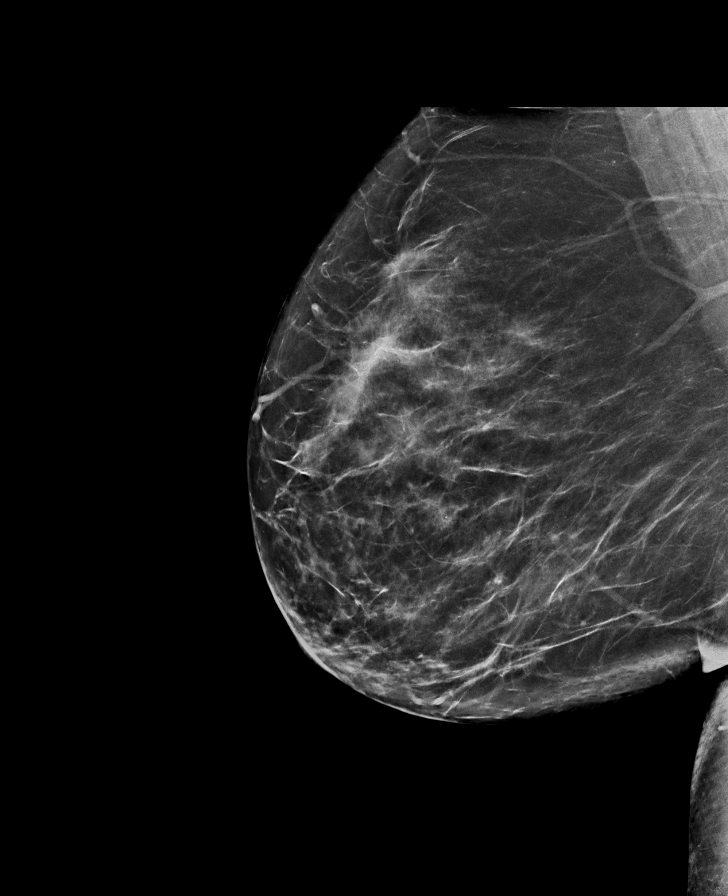

[L CC synth-2D]
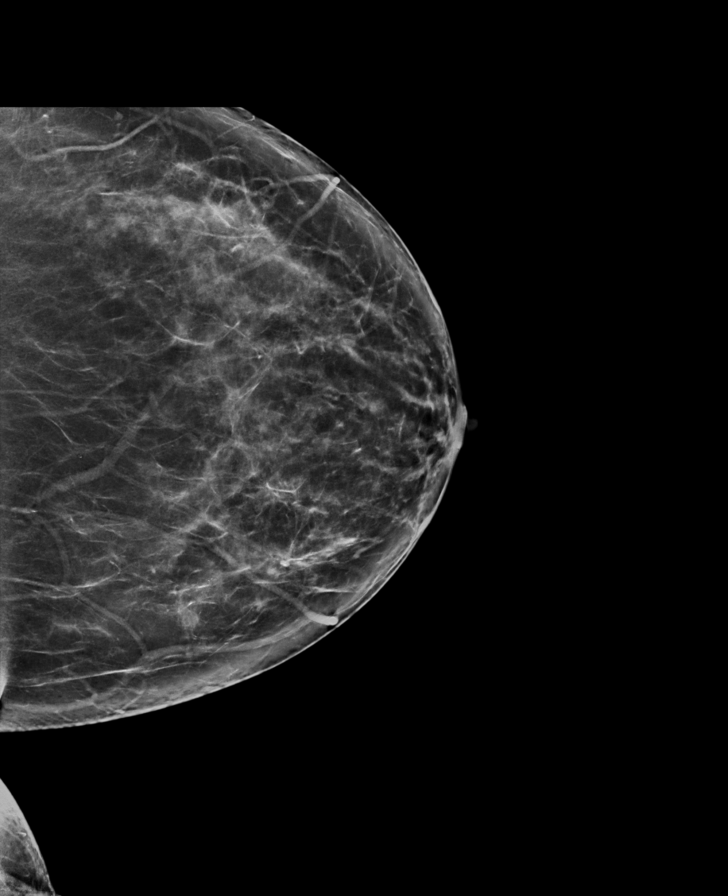

[L MLO synth-2D]
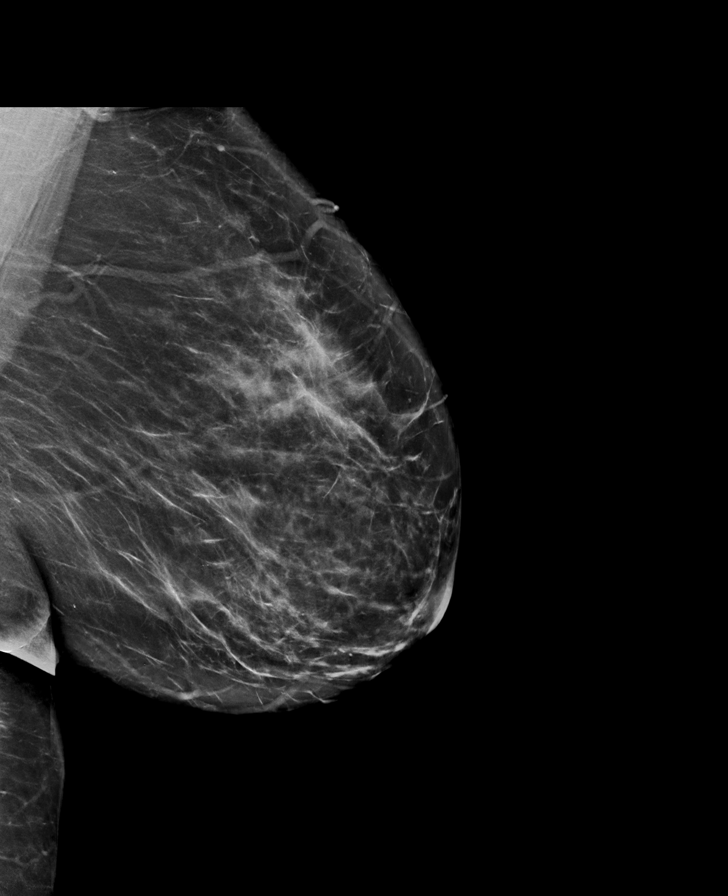

[R MLO tomo · tomo slice 44/87.0]
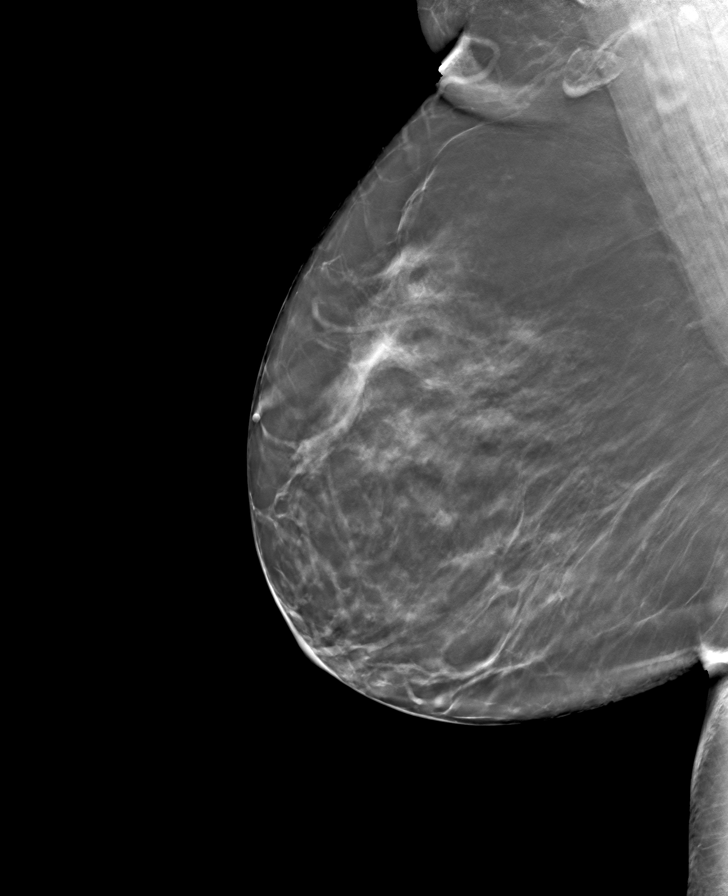

[R CC tomo · tomo slice 41/82.0]
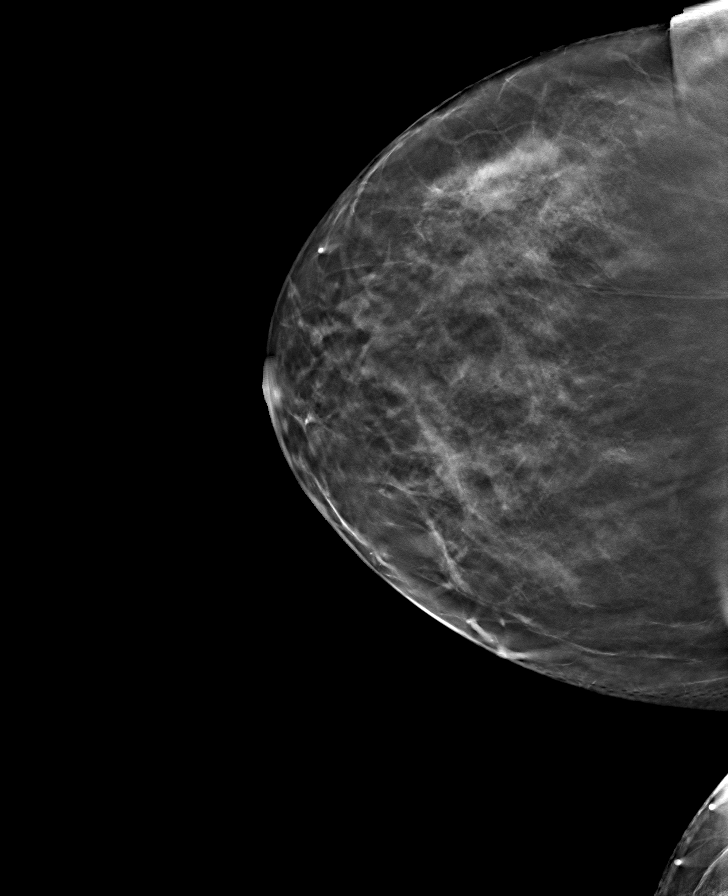

[L MLO tomo · tomo slice 47/94.0]
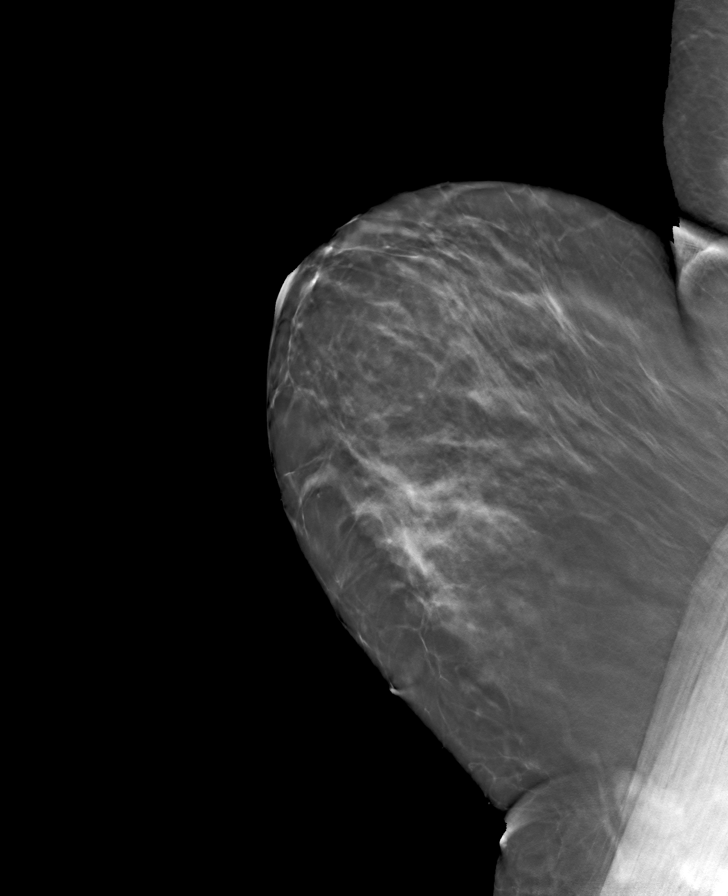

[L CC tomo · tomo slice 41/81.0]
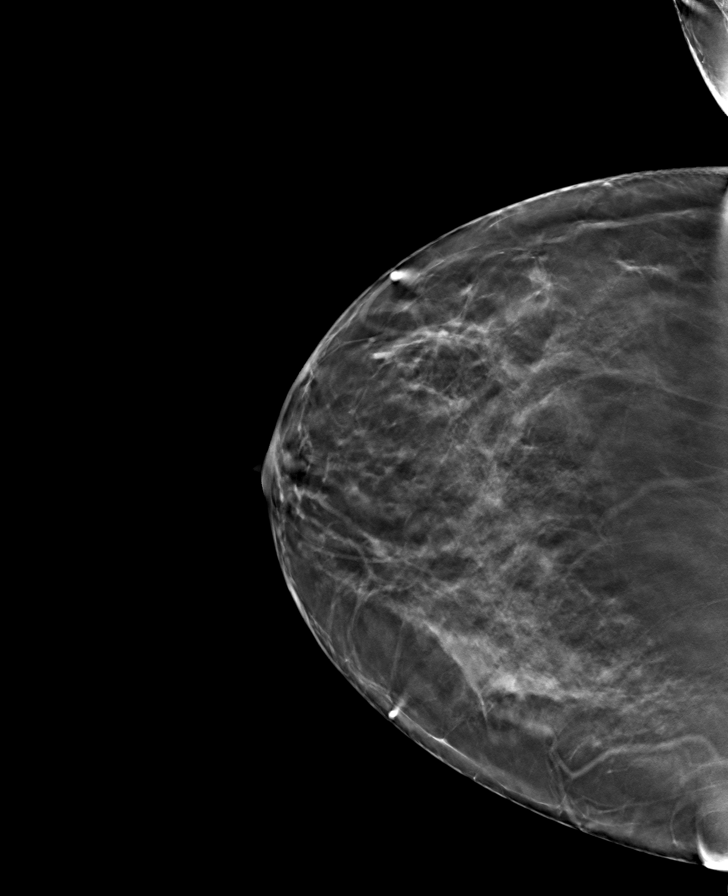

[8 of 24 positions shown; findings below may reference images not displayed]

ACR Breast Density Category c: The breast tissue is heterogeneously
dense, which may obscure small masses.
FINDINGS: There are no findings suspicious for malignancy.
IMPRESSION: No mammographic evidence of malignancy. A result letter of this
screening mammogram will be mailed directly to the patient.

RECOMMENDATION:
Screening mammogram in one year. (Code:Q3-W-BC3)

BI-RADS CATEGORY  1: Negative.

## 2022-06-08 ENCOUNTER — Telehealth (INDEPENDENT_AMBULATORY_CARE_PROVIDER_SITE_OTHER): Payer: Commercial Managed Care - PPO | Admitting: Family

## 2022-06-08 DIAGNOSIS — F418 Other specified anxiety disorders: Secondary | ICD-10-CM | POA: Diagnosis not present

## 2022-06-08 DIAGNOSIS — E039 Hypothyroidism, unspecified: Secondary | ICD-10-CM | POA: Diagnosis not present

## 2022-06-08 DIAGNOSIS — R41840 Attention and concentration deficit: Secondary | ICD-10-CM | POA: Diagnosis not present

## 2022-06-08 NOTE — Progress Notes (Signed)
Virtual telephone visit    Virtual Visit via Telephone Note   This visit type was conducted due to national recommendations for restrictions regarding the COVID-19 Pandemic (e.g. social distancing) in an effort to limit this patient's exposure and mitigate transmission in our community. Due to her co-morbid illnesses, this patient is at least at moderate risk for complications without adequate follow up. This format is felt to be most appropriate for this patient at this time. The patient did not have access to video technology or had technical difficulties with video requiring transitioning to audio format only (telephone). Physical exam was limited to content and character of the telephone converstion. Windell Moulding was able to get the patient set up on a telephone visit.   Patient location: Home  Provider location: Office  I discussed the limitations of evaluation and management by telemedicine and the availability of in person appointments. The patient expressed understanding and agreed to proceed.   Visit Date: 06/08/2022  Today's healthcare provider: Lemont Fillers, NP     Subjective:    Patient ID: Cheyenne Hawkins, female    DOB: 1975/03/19, 47 y.o.   MRN: [id number]  Chief Complaint  Patient presents with   Fatigue    Complains of feeling tired   Attention Deficit    Patient complains of having "attention deficit" and having trouble focusing    HPI Patient is in today for fatigue, attention deficit.  Hypothyroidism: treated with levothyroxine 125 mcg daily. She has been more fatigued recently. She has been busy with school recently but says her fatigue goes beyond that.   Attention Deficit: she has been having increased difficulty with focusing. Believes she may have adult ADHD and would like to be screened. Compliant with bupropion, escitalopram.  Past Medical History:  Diagnosis Date   Anemia, iron deficiency 11/19/2011   Arthritis    Atrial fibrillation (HCC)     with Graves disease    Blood transfusion without reported diagnosis    COVID-19 virus infection 08/15/2020   Depression 2012   Fatty liver 03/21/2011   GERD (gastroesophageal reflux disease)    Grave's disease 2002   s/p thyroidectomy   Heart murmur    MVP   History of chicken pox    HSV-2 (herpes simplex virus 2) infection    Hypertension     Past Surgical History:  Procedure Laterality Date   CESAREAN SECTION  2001   CESAREAN SECTION  2003   CESAREAN SECTION  2007   COLONOSCOPY  2010   [facility]    THYROIDECTOMY  2003   for graves disease   TUBAL LIGATION      Family History  Problem Relation Age of Onset   Alcohol abuse Mother    Depression Mother    Bipolar disorder Mother    Alcohol abuse Father    Diabetes Father    Hypertension Father    Colon polyps Father    CVA Father    Osteoarthritis Father    Memory loss Father    Hypertension Sister    Depression Sister    ADD / ADHD Brother    ADD / ADHD Brother    Cancer Maternal Grandmother        colon   Depression Maternal Grandmother    Diabetes Maternal Grandmother    Hypertension Maternal Grandmother    Colon cancer Maternal Grandmother    Colon polyps Maternal Grandmother    Cancer Paternal Grandmother        breast  Diabetes Paternal Grandmother    Hypertension Paternal Industrial/product designer Allergies Daughter    Environmental Allergies Daughter    Environmental Allergies Son    Esophageal cancer Neg Hx    Rectal cancer Neg Hx    Stomach cancer Neg Hx     Social History   Socioeconomic History   Marital status: Married    Spouse name: Not on file   Number of children: 3   Years of education: Not on file   Highest education level: Not on file  Occupational History    Employer: [employer]  Tobacco Use   Smoking status: Never   Smokeless tobacco: Never  Vaping Use   Vaping Use: Never used  Substance and Sexual Activity   Alcohol use: Yes    Alcohol/week: 3.0 standard drinks of  alcohol    Types: 3 Glasses of wine per week   Drug use: No   Sexual activity: Yes    Birth control/protection: Surgical    Comment: btl  Other Topics Concern   Not on file  Social History Narrative   Regular exercise:  No Caffeine Use: 2 drinks weekly   Right handed    Social Determinants of Health   Financial Resource Strain: Not on file  Food Insecurity: Not on file  Transportation Needs: Not on file  Physical Activity: Not on file  Stress: Not on file  Social Connections: Not on file  Intimate Partner Violence: Not on file    Outpatient Medications Prior to Visit  Medication Sig Dispense Refill   buPROPion (WELLBUTRIN XL) 150 MG 24 hr tablet Take 1 tablet (150 mg total) by mouth daily. 90 tablet 1   escitalopram (LEXAPRO) 20 MG tablet Take 1 tablet (20 mg total) by mouth daily. 90 tablet 1   Iron, Ferrous Sulfate, 325 (65 Fe) MG TABS Take 325 mg by mouth every other day. 90 tablet 1   levothyroxine (SYNTHROID) 125 MCG tablet Take 1 tablet (125 mcg total) by mouth daily. 90 tablet 0   meloxicam (MOBIC) 7.5 MG tablet Take 1 tablet (7.5 mg total) by mouth daily. 14 tablet 0   Multiple Vitamins-Minerals (MULTIVITAMIN WITH MINERALS) tablet Take 1 tablet by mouth daily.     Semaglutide-Weight Management (WEGOVY) 0.5 MG/0.5ML SOAJ Inject 0.5 mg into the skin once a week. 6 mL 0   valACYclovir (VALTREX) 1000 MG tablet Take 1 tablet (1,000 mg total) by mouth daily as needed for 5 days for outbreak. 20 tablet 5   No facility-administered medications prior to visit.    No Known Allergies  Review of Systems  Constitutional:  Positive for malaise/fatigue. Negative for fever.  HENT:  Negative for congestion.   Eyes:  Negative for blurred vision.  Respiratory:  Negative for cough and shortness of breath.   Cardiovascular:  Negative for chest pain, palpitations and leg swelling.  Gastrointestinal:  Negative for vomiting.  Musculoskeletal:  Negative for back pain.  Skin:  Negative  for rash.  Neurological:  Negative for loss of consciousness and headaches.       Objective:    Physical Exam Constitutional:      General: She is not in acute distress.    Appearance: Normal appearance. She is well-developed. She is not ill-appearing.  HENT:     Head: Normocephalic and atraumatic.  Eyes:     General: Lids are normal.  Pulmonary:     Effort: Pulmonary effort is normal.  Neurological:     Mental Status: She  is alert and oriented to person, place, and time.  Psychiatric:        Attention and Perception: Attention and perception normal.        Judgment: Judgment normal.     There were no vitals taken for this visit. Wt Readings from Last 3 Encounters:  04/19/22 184 lb (83.5 kg)  03/10/22 187 lb (84.8 kg)  02/09/22 196 lb (88.9 kg)       Assessment & Plan:  Attention deficit Assessment & Plan: She has had great deal of stress between work, school, and her NP Clinicals.  Could be stress related, but will plan to refer her to psychology for formal ADHD assessment.   Orders: -     Ambulatory referral to Psychology  Depression with anxiety Assessment & Plan: Overall stable on wellbutrin/lexapro. Continue same.    Hypothyroidism, unspecified type Assessment & Plan: Due for follow up TSH, advised pt to schedule lab appointment.      I discussed the assessment and treatment plan with the patient. The patient was provided an opportunity to ask questions and all were answered. The patient agreed with the plan and demonstrated an understanding of the instructions.   The patient was advised to call back or seek an in-person evaluation if the symptoms worsen or if the condition fails to improve as anticipated.  I,Alexander Ruley,acting as a Neurosurgeon for [employer], NP.,have documented all relevant documentation on the behalf of Lemont Fillers, NP,as directed by  Lemont Fillers, NP while in the presence of Lemont Fillers,  NP.    Lemont Fillers, NP [facility] [phone] (phone) [phone] (fax)  [facility]

## 2022-06-08 NOTE — Assessment & Plan Note (Signed)
Overall stable on wellbutrin/lexapro. Continue same.

## 2022-06-08 NOTE — Assessment & Plan Note (Signed)
She has had great deal of stress between work, school, and her NP Clinicals.  Could be stress related, but will plan to refer her to psychology for formal ADHD assessment.

## 2022-06-08 NOTE — Assessment & Plan Note (Signed)
Due for follow up TSH, advised pt to schedule lab appointment.

## 2022-06-10 NOTE — Progress Notes (Signed)
Office Visit Note  Patient: Cheyenne Hawkins             Date of Birth: [birth year]           MRN: [id number]             PCP: Sandford Craze, NP Referring: Sandford Craze, NP Visit Date: 06/24/2022 Occupation: @GUAROCC @  Subjective:  right elbow pain   History of Present Illness: Cheyenne Hawkins is a 47 y.o. female seen in consultation per request of her PCP.  According to the patient her symptoms started in November 2023 with pain and discomfort in her bilateral hands which lasted for about a month.  She states her hands also were cold and she had to wear gloves.  He does not have any problems with cold hands or discomfort in her hands.  She states gradually the pain moved to her right elbow.  Right elbow joint continues to hurt.  She states sometimes she notices warmth on her right elbow joint.  She also has nocturnal pain at times.  None of the other joints are painful.  She has some tenderness in the muscles around the elbow.  There is no history of oral ulcers, nasal ulcers, malar rash, photosensitivity, Raynaud's, lymphadenopathy.  None of the other joints are painful.  There is history of osteoarthritis in her parents.  She is gravida 4, para 3, abortion 1.  There is no history of preeclampsia or DVTs.    Activities of Daily Living:  Patient reports morning stiffness for 5 minutes.   Patient Denies nocturnal pain.  Difficulty dressing/grooming: Denies Difficulty climbing stairs: Denies Difficulty getting out of chair: Denies Difficulty using hands for taps, buttons, cutlery, and/or writing: Denies  Review of Systems  Constitutional:  Positive for fatigue.  HENT:  Negative for mouth sores and mouth dryness.   Eyes:  Negative for dryness.  Respiratory:  Negative for shortness of breath.   Cardiovascular:  Negative for chest pain and palpitations.  Gastrointestinal:  Negative for blood in stool, constipation and diarrhea.  Endocrine: Negative for increased  urination.  Genitourinary:  Negative for involuntary urination.  Musculoskeletal:  Positive for joint pain, joint pain, joint swelling, myalgias, morning stiffness and myalgias. Negative for gait problem, muscle weakness and muscle tenderness.  Skin:  Negative for color change, rash, hair loss and sensitivity to sunlight.  Allergic/Immunologic: Negative for susceptible to infections.  Neurological:  Negative for dizziness and headaches.  Hematological:  Negative for swollen glands.  Psychiatric/Behavioral:  Positive for depressed mood and sleep disturbance. The patient is nervous/anxious.     PMFS History:  Patient Active Problem List   Diagnosis Date Noted   Attention deficit 06/08/2022   Olecranon bursitis of right elbow 04/19/2022   Essential hypertension 03/10/2022   Positive ANA (antinuclear antibody) 12/12/2021   Obesity (BMI 30.0-34.9) 12/08/2021   Perimenopause 12/08/2021   Arthralgia 12/08/2021   Fall 05/01/2021   Screen for STD (sexually transmitted disease) 03/13/2021   Abnormal urine odor 03/13/2021   Onychomycosis 05/01/2020   Paroxysmal atrial fibrillation (HCC) 07/26/2017   Abnormal EKG 06/16/2017   Mitral valve prolapse 06/16/2017   Tachycardia 08/12/2015   Hypothyroidism 06/16/2012   Anemia, iron deficiency 11/19/2011   Skin picking habit 11/05/2011   Preventative health care 08/02/2011   Galactorrhea 08/02/2011   HSV-2 (herpes simplex virus 2) infection 07/09/2011   Depression with anxiety 08/18/2010   IBS (irritable bowel syndrome) 08/18/2010   GERD (gastroesophageal reflux disease) 08/18/2010   Atypical chest  pain 04/24/2008   CONSTIPATION 04/19/2008    Past Medical History:  Diagnosis Date   Anemia, iron deficiency 11/19/2011   Arthritis    Atrial fibrillation (HCC)    with Graves disease    Blood transfusion without reported diagnosis    COVID-19 virus infection 08/15/2020   Depression 2012   Fatty liver 03/21/2011   GERD (gastroesophageal reflux  disease)    Grave's disease 2002   s/p thyroidectomy   Heart murmur    MVP   History of chicken pox    HSV-2 (herpes simplex virus 2) infection    Hypertension     Family History  Problem Relation Age of Onset   Alcohol abuse Mother    Depression Mother    Bipolar disorder Mother    Alcohol abuse Father    Diabetes Father    Hypertension Father    Colon polyps Father    CVA Father    Memory loss Father    Hypertension Sister    Depression Sister    ADD / ADHD Brother    ADD / ADHD Brother    Cancer Maternal Grandmother        colon   Depression Maternal Grandmother    Diabetes Maternal Grandmother    Hypertension Maternal Grandmother    Colon cancer Maternal Grandmother    Colon polyps Maternal Grandmother    Cancer Paternal Grandmother        breast   Diabetes Paternal Grandmother    Hypertension Paternal Industrial/product designer Allergies Daughter    Environmental Allergies Daughter    Environmental Allergies Son    Esophageal cancer Neg Hx    Rectal cancer Neg Hx    Stomach cancer Neg Hx    Past Surgical History:  Procedure Laterality Date   CESAREAN SECTION  2001   CESAREAN SECTION  2003   CESAREAN SECTION  2007   COLONOSCOPY  2010   [facility]    THYROIDECTOMY  2003   for graves disease   TUBAL LIGATION     Social History   Social History Narrative   Regular exercise:  No Caffeine Use: 2 drinks weekly   Right handed    Immunization History  Administered Date(s) Administered   Influenza Split 11/12/2011, 10/11/2021   Influenza,inj,Quad PF,6+ Mos 10/12/2015   Influenza,inj,Quad PF,6-35 Mos 10/29/2018   Influenza-Unspecified 09/11/2013, 10/25/2017, 10/12/2019, 10/29/2019, 10/11/2020   PFIZER(Purple Top)SARS-COV-2 Vaccination 02/05/2019, 02/26/2019   Tdap 09/11/2013     Objective: Vital Signs: BP 112/77 (BP Location: Right Arm, Patient Position: Sitting, Cuff Size: Normal)   Pulse 84   Resp 14   Ht 5\' 4"  (1.626 m)   Wt 184 lb (83.5 kg)   BMI  31.58 kg/m    Physical Exam Vitals and nursing note reviewed.  Constitutional:      Appearance: She is well-developed.  HENT:     Head: Normocephalic and atraumatic.  Eyes:     Conjunctiva/sclera: Conjunctivae normal.  Cardiovascular:     Rate and Rhythm: Normal rate and regular rhythm.     Heart sounds: Normal heart sounds.  Pulmonary:     Effort: Pulmonary effort is normal.     Breath sounds: Normal breath sounds.  Abdominal:     General: Bowel sounds are normal.     Palpations: Abdomen is soft.  Musculoskeletal:     Cervical back: Normal range of motion.  Lymphadenopathy:     Cervical: No cervical adenopathy.  Skin:    General: Skin is  warm and dry.     Capillary Refill: Capillary refill takes less than 2 seconds.  Neurological:     Mental Status: She is alert and oriented to person, place, and time.  Psychiatric:        Behavior: Behavior normal.      Musculoskeletal Exam: Cervical, thoracic and lumbar spine were in good range of motion.  Shoulder joints, elbow joints, wrist joints, MCPs PIPs and DIPs were in good range of motion.  She had bilateral second and fifth finger DIP thickening.  She had tenderness over right lateral epicondyle region.  Hips, knees, ankles, MTPs and good range of motion.  She had bilateral bunions.  She had discomfort coming up from the squatting position.  CDAI Exam: CDAI Score: -- Patient Global: --; Provider Global: -- Swollen: --; Tender: -- Joint Exam 06/24/2022   No joint exam has been documented for this visit   There is currently no information documented on the homunculus. Go to the Rheumatology activity and complete the homunculus joint exam.  Investigation: No additional findings.  Imaging: No results found.  Recent Labs: Lab Results  Component Value Date   WBC 5.6 12/08/2021   HGB 11.2 (L) 12/08/2021   PLT 357.0 12/08/2021   NA 137 03/19/2022   K 4.2 03/19/2022   CL 101 03/19/2022   CO2 28 03/19/2022   GLUCOSE 95  03/19/2022   BUN 9 03/19/2022   CREATININE 0.61 03/19/2022   BILITOT 0.5 03/13/2021   ALKPHOS 70 03/13/2021   AST 17 03/13/2021   ALT 15 03/13/2021   PROT 7.4 03/13/2021   ALBUMIN 4.2 03/13/2021   CALCIUM 9.5 03/19/2022   GFRAA >89 06/15/2011   QFTBGOLDPLUS NEGATIVE 04/08/2022    Speciality Comments: No specialty comments available.  Procedures:  Medium Joint Inj: R lateral epicondyle on 06/24/2022 8:59 AM Indications: pain Details: 27 G 1.5 in needle, lateral approach Medications: 1 mL lidocaine 1 %; 30 mg triamcinolone acetonide 40 MG/ML Aspirate: 0 mL Outcome: tolerated well, no immediate complications Procedure, treatment alternatives, risks and benefits explained, specific risks discussed. Consent was given by the patient. Immediately prior to procedure a time out was called to verify the correct patient, procedure, equipment, support staff and site/side marked as required. Patient was prepped and draped in the usual sterile fashion.     Allergies: Patient has no known allergies.   Assessment / Plan:     Visit Diagnoses: Lateral epicondylitis of left elbow-she had tenderness over the left lateral epicondyle region consistent with lateral epicondylitis.  She has tried Voltaren gel without much help.  She he uses mouse at work.  She has also been taking art classes.  Detailed counseling regarding lateral epicondylitis was provided.  I discussed the option of physical therapy which she declined.  Her symptoms have been lingering on for a long time.  I discussed the option of cortisone injection.  Side effects were discussed.  Patient wanted to proceed with a cortisone injection.  After informed consent was obtained right lateral epicondyle region was infiltrated with lidocaine and Kenalog as described above.  Patient tolerated the procedure well.  Postprocedure instructions were given.  Prescription for right tennis elbow brace was given.  A handout on exercises was given.  Advised  patient to come back on as needed basis.  She will notify me if her symptoms gets worse.  Pain in both hands-patient had an episode of bilateral hand pain in November which lasted for about a month and then resolved.  She states her hands were also very cold at the time she was wearing gloves.  She had no recurrence of symptoms since then.  Positive ANA (antinuclear antibody) - 12/08/21: ANA 1:40NS, ESR 11, RF<14, ANA is low titer positive and not significant.  She denies history of oral ulcers, nasal ulcers, malar rash, photosensitivity, Raynaud's, lymphadenopathy or inflammatory arthritis.  She was advised to contact us if she develops any new signs symptoms.  No further workup is required at this time.  Chronic pain of both knees-she has off-and-on discomfort in her knee joints.  She has some difficulty getting up from the squatting position.  She possibly have chondromalacia patella.  She has seen orthopedic surgeons in the past.  Handout on lower extremity muscle strengthening exercise was given.  Other medical problems are listed as follows:  Other fatigue  Essential hypertension-pressure was 112/77.  She is currently not taking any blood pressure medication.  Mitral valve prolapse  Paroxysmal atrial fibrillation (HCC)  History of gastroesophageal reflux (GERD)  History of IBS  History of hypothyroidism  HSV-2 (herpes simplex virus 2) infection  Depression with anxiety-she is on Lexapro and Wellbutrin.  Orders: Orders Placed This Encounter  Procedures   Medium Joint Inj   No orders of the defined types were placed in this encounter.    Follow-Up Instructions: Return if symptoms worsen or fail to improve, for Lateral epicondylitis.   Pollyann Savoy, MD  Note - This record has been created using Animal nutritionist.  Chart creation errors have been sought, but may not always  have been located. Such creation errors do not reflect on  the standard of medical care.

## 2022-06-22 DIAGNOSIS — F411 Generalized anxiety disorder: Secondary | ICD-10-CM | POA: Diagnosis not present

## 2022-06-22 DIAGNOSIS — F331 Major depressive disorder, recurrent, moderate: Secondary | ICD-10-CM | POA: Diagnosis not present

## 2022-06-24 ENCOUNTER — Ambulatory Visit: Payer: Commercial Managed Care - PPO | Attending: Rheumatology | Admitting: Rheumatology

## 2022-06-24 ENCOUNTER — Encounter: Payer: Self-pay | Admitting: Rheumatology

## 2022-06-24 VITALS — BP 112/77 | HR 84 | Resp 14 | Ht 64.0 in | Wt 184.0 lb

## 2022-06-24 DIAGNOSIS — I341 Nonrheumatic mitral (valve) prolapse: Secondary | ICD-10-CM | POA: Diagnosis not present

## 2022-06-24 DIAGNOSIS — M25561 Pain in right knee: Secondary | ICD-10-CM | POA: Diagnosis not present

## 2022-06-24 DIAGNOSIS — M79641 Pain in right hand: Secondary | ICD-10-CM

## 2022-06-24 DIAGNOSIS — M7712 Lateral epicondylitis, left elbow: Secondary | ICD-10-CM

## 2022-06-24 DIAGNOSIS — R768 Other specified abnormal immunological findings in serum: Secondary | ICD-10-CM

## 2022-06-24 DIAGNOSIS — Z8639 Personal history of other endocrine, nutritional and metabolic disease: Secondary | ICD-10-CM

## 2022-06-24 DIAGNOSIS — I10 Essential (primary) hypertension: Secondary | ICD-10-CM | POA: Diagnosis not present

## 2022-06-24 DIAGNOSIS — M79642 Pain in left hand: Secondary | ICD-10-CM

## 2022-06-24 DIAGNOSIS — I480 Paroxysmal atrial fibrillation: Secondary | ICD-10-CM

## 2022-06-24 DIAGNOSIS — M2550 Pain in unspecified joint: Secondary | ICD-10-CM

## 2022-06-24 DIAGNOSIS — B009 Herpesviral infection, unspecified: Secondary | ICD-10-CM

## 2022-06-24 DIAGNOSIS — G8929 Other chronic pain: Secondary | ICD-10-CM

## 2022-06-24 DIAGNOSIS — Z8719 Personal history of other diseases of the digestive system: Secondary | ICD-10-CM

## 2022-06-24 DIAGNOSIS — R5383 Other fatigue: Secondary | ICD-10-CM | POA: Diagnosis not present

## 2022-06-24 DIAGNOSIS — M25562 Pain in left knee: Secondary | ICD-10-CM

## 2022-06-24 DIAGNOSIS — F418 Other specified anxiety disorders: Secondary | ICD-10-CM

## 2022-06-24 DIAGNOSIS — M7021 Olecranon bursitis, right elbow: Secondary | ICD-10-CM

## 2022-06-24 MED ORDER — TRIAMCINOLONE ACETONIDE 40 MG/ML IJ SUSP
30.0000 mg | INTRAMUSCULAR | Status: AC | PRN
Start: 2022-06-24 — End: 2022-06-24
  Administered 2022-06-24: 30 mg via INTRA_ARTICULAR

## 2022-06-24 MED ORDER — LIDOCAINE HCL 1 % IJ SOLN
1.0000 mL | INTRAMUSCULAR | Status: AC | PRN
Start: 2022-06-24 — End: 2022-06-24
  Administered 2022-06-24: 1 mL

## 2022-06-24 NOTE — Patient Instructions (Addendum)
Tennis Elbow  Tennis elbow (lateral epicondylitis) is inflammation of tendons in your outer forearm, near your elbow. Tendons are tissues that connect muscle to bone. When you have tennis elbow, inflammation affects the tendons that you use to bend your wrist and move your hand up. Inflammation occurs in the lower part of the upper arm bone (humerus), where the tendons connect to the bone (lateral epicondyle). Tennis elbow often affects people who play tennis, but anyone may get the condition from repeatedly extending the wrist or turning the forearm. What are the causes? This condition is usually caused by repeatedly extending the wrist, turning the forearm, and using the hands. It can result from sports or work that requires repetitive forearm movements. In some cases, it may be caused by a sudden injury. What increases the risk? You are more likely to develop tennis elbow if you play tennis or another racket sport. You also have a higher risk if you frequently use your hands for work. Besides people who play tennis, others at greater risk include: People who use computers. Holiday representative workers. People who work in [employer]. Musicians. Cooks. Cashiers. What are the signs or symptoms? Symptoms of this condition include: Pain and tenderness in the forearm and the outer part of the elbow. Pain may be felt only when using the arm, or it may be there all the time. A burning feeling that starts in the elbow and spreads down the forearm. A weak grip in the hand. How is this diagnosed? This condition is diagnosed based on your symptoms, your medical history, and a physical exam. You may also have X-rays or an MRI to: Confirm the diagnosis. Look for other issues. Check for tears in the ligaments, muscles, or tendons. How is this treated? Resting and icing your arm is often the first treatment. Your health care provider may also recommend: Medicines to reduce pain and inflammation. These may  be in the form of a pill, topical gels, or shots of a steroid medicine (cortisone). An elbow strap to reduce stress on the area. Physical therapy. This may include massage or exercises or both. An elbow brace to restrict the movements that cause symptoms. If these treatments do not help relieve your symptoms, your health care provider may recommend surgery to remove damaged muscle and reattach healthy muscle to bone. Follow these instructions at home: If you have a brace or strap: Wear the brace or strap as told by your health care provider. Remove it only as told by your health care provider. Check the skin around the brace or strap every day. Tell your health care provider about any concerns. Loosen the brace if your fingers tingle, become numb, or turn cold and blue. Keep the brace clean. If the brace or strap is not waterproof: Do not let it get wet. Cover it with a watertight covering when you take a bath or a shower. Managing pain, stiffness, and swelling  If directed, put ice on the injured area. To do this: If you have a removable brace or strap, remove it as told by your health care provider. Put ice in a plastic bag. Place a towel between your skin and the bag. Leave the ice on for 20 minutes, 2-3 times a day. Remove the ice if your skin turns bright red. This is very important. If you cannot feel pain, heat, or cold, you have a greater risk of damage to the area. Move your fingers often to reduce stiffness and swelling. Activity Rest your  elbow and wrist and avoid activities that cause symptoms as told by your health care provider. Do physical therapy exercises as told by your health care provider. If you lift an object, lift it with your palm facing up. This reduces stress on your elbow. Lifestyle If your tennis elbow is caused by sports, check your equipment and make sure that: You use it correctly. It is good match for you. If your tennis elbow is caused by work or  computer use, take frequent breaks to stretch your arm. Talk with your employer about ways to manage your condition at work. General instructions Take over-the-counter and prescription medicines only as told by your health care provider. Do not use any products that contain nicotine or tobacco. These products include cigarettes, chewing tobacco, and vaping devices, such as e-cigarettes. If you need help quitting, ask your health care provider. Keep all follow-up visits. This is important. How is this prevented? Before and after activity: Warm up and stretch before being active. Cool down and stretch after being active. Give your body time to rest between periods of activity. During activity: Make sure to use equipment that fits you. If you play tennis, put power in your stroke with your lower body. Avoid using your arm only. Maintain physical fitness, including: Strength. Flexibility. Endurance. Do exercises to strengthen the forearm muscles. Contact a health care provider if: You have pain that gets worse or does not get better with treatment. You have numbness or weakness in your forearm, hand, or fingers. Get help right away if: Your pain is severe. You cannot move your wrist. Summary Tennis elbow (lateral epicondylitis) is inflammation of tendons in your outer forearm, near your elbow. Common symptoms include pain and tenderness in your forearm and the outer part of your elbow. This condition is usually caused by repeatedly extending your wrist, turning your forearm, and using your hands. The first treatment is often resting and icing your arm to relieve symptoms. Further treatment may include taking medicine, getting physical therapy, wearing a brace or strap, or having surgery. This information is not intended to replace advice given to you by your health care provider. Make sure you discuss any questions you have with your health care provider. Document Revised: 07/10/2019  Document Reviewed: 07/10/2019 [employer] Patient Education  2024 [employer] Elbow and Forearm Exercises Ask your health care provider which exercises are safe for you. Do exercises exactly as told by your provider and adjust them as directed. It is normal to feel mild stretching, pulling, tightness, or discomfort as you do these exercises. Stop right away if you feel sudden pain or your pain gets worse. Do not begin these exercises until told by your provider. Range-of-motion exercises These exercises warm up your muscles and joints and improve the movement and flexibility of your injured elbow and forearm. The exercises also help to relieve pain, numbness, and tingling. These exercises are done using the muscles in your injured elbow and forearm (active). Elbow flexion, active  Hold your left / right arm at your side, and bend your elbow (flexion) as far as you can using only your arm muscles. Hold this position for __________ seconds. Slowly return to the starting position. Repeat __________ times. Complete this exercise __________ times a day. Elbow extension, active  Hold your left / right arm at your side, and straighten your elbow (extension) as much as you can using only your arm muscles. Hold this position for __________ seconds. Slowly return to the starting position. Repeat  __________ times. Complete this exercise __________ times a day. Forearm rotation, supination This is an exercise in which you turn (rotate) your forearm palm-up (supination). Stand or sit with your elbows at your sides. Bend your left / right elbow to a 90-degree angle (right angle). Position your forearm so that the thumb is facing the ceiling (neutral position). Rotate your palm up toward the ceiling until you feel a gentle stretch on the inside of your forearm. If told, use your other hand to help rotate your forearm farther until you feel a gentle to moderate stretch. Hold this position for __________  seconds. Slowly return to the starting position. Repeat __________ times. Complete this exercise __________ times a day. Forearm rotation, pronation This is an exercise in which you turn (rotate) your forearm palm-down (pronation). Stand or sit with your elbows at your sides. Bend your left / right elbow to a 90-degree angle (right angle). Position your forearm so that the thumb is facing the ceiling (neutral position). Rotate your palm down until you feel a gentle stretch on the top of your forearm. If told, use your other hand to help rotate your forearm farther until you feel a gentle to moderate stretch. Hold this position for __________ seconds. Slowly return to the starting position. Repeat __________ times. Complete this exercise __________ times a day. Stretching exercises These exercises warm up your muscles and joints and improve the movement and flexibility of your injured elbow and forearm. These exercises also help to relieve pain, numbness, and tingling. These exercises are done using your healthy elbow and forearm to help stretch the muscles in your injured elbow and forearm (active-assisted). Elbow flexion, active-assisted  Hold your left / right arm at your side, and bend your elbow (flexion) as much as you can using your left / right arm muscles. Use your other hand to bend your left / right elbow farther. To do this, gently push up on your forearm until you feel a gentle stretch on the back of your elbow. Hold this position for __________ seconds. Slowly return to the starting position. Repeat __________ times. Complete this exercise __________ times a day. Elbow extension, active-assisted  Hold your left / right arm at your side, and straighten your elbow (extension) as much as you can using your left / right arm muscles. Use your other hand to straighten the left / right elbow farther. To do this, gently push down on your forearm until you feel a gentle stretch on the  inside of your elbow. Hold this position for __________ seconds. Slowly return to the starting position. Repeat __________ times. Complete this exercise __________ times a day. Passive elbow flexion, supine  Lie on your back (supine position). Extend your left / right arm up in the air, bracing it with your other hand. Let your left / right hand slowly lower toward your shoulder (passive flexion) while your elbow stays pointed toward the ceiling. You should feel a gentle stretch along the back of your upper arm and elbow. If told by your provider, you may add a small wrist weight or hand weight to increase the stretch. Hold this position for __________ seconds. Slowly return to the starting position. Repeat __________ times. Complete this exercise __________ times a day. Passive elbow extension, supine  Lie on your back (supine position). Make sure that you are in a comfortable position that lets you relax your arm muscles. Place a folded towel under your left / right upper arm so your elbow and  shoulder are at the same height. Straighten your left / right arm so your elbow does not rest on the bed or towel. Let the weight of your hand stretch your elbow (passive extension). Keep your arm and chest muscles relaxed. You should feel a stretch on the inside of your elbow. If told by your provider, you may add a small wrist weight or hand weight to increase the stretch. Hold this position for __________ seconds. Slowly release the stretch. Repeat __________ times. Complete this exercise __________ times a day. Strengthening exercises These exercises build strength and endurance in your elbow and forearm. Endurance is the ability to use your muscles for a long time, even after they get tired. Elbow flexion, isometric  Stand or sit up straight. Bend your left / right elbow in a 90-degree angle (right angle). Keep your forearm at the height of your waist. Your thumb should be pointed toward the  ceiling (neutral forearm). Place your other hand on top of your left / right forearm. Gently push down while you resist with your left / right arm (isometric flexion). Push as hard as you can with both arms without causing any pain or movement at your left / right elbow. Hold this position for __________ seconds. Slowly release the tension in both arms. Let your muscles relax completely before you repeat the exercise. Repeat __________ times. Complete this exercise __________ times a day. Elbow extension, isometric  Stand or sit up straight. Place your left / right arm so your palm faces your abdomen and is at the height of your waist. Place your other hand on the underside of your left / right forearm. Gently push up while you resist with your left / right arm (isometric extension). Push as hard as you can with both arms without causing any pain or movement at your left / right elbow. Hold this position for __________ seconds. Slowly release the tension in both arms. Let your muscles relax completely before you repeat the exercise. Repeat __________ times. Complete this exercise __________ times a day. Elbow flexion with forearm palm up  Sit on a firm chair without armrests or stand up. Place your left / right arm at your side with your elbow straight and your palm facing forward. Holding a __________ weight or gripping a rubber exercise band or tubing, bend your elbow to bring your hand toward your shoulder (flexion). Hold this position for __________ seconds. Slowly return to the starting position. Repeat __________ times. Complete this exercise __________ times a day. Elbow extension, active  Sit on a firm chair without armrests or stand up. Hold a rubber exercise band or tubing in both hands. Keeping your upper arms at your sides, bring both hands up to your left / right shoulder. Keep your left / right hand just below your other hand. Straighten your left / right elbow (extension)  while keeping your other arm still. Hold this position for __________ seconds. Control the resistance of the band or tubing as you return to the starting position. Repeat __________ times. Complete this exercise __________ times a day. Forearm rotation with weight, supination  Sit with your left / right forearm supported on a table. Your elbow should be at waist height and bent at a 90-degree angle (right angle). Gently grasp a lightweight hammer. Rest your hand over the edge of the table with your palm facing down. Without moving your left / right elbow, slowly rotate your forearm to turn your palm up toward the ceiling (supination).  Hold this position for __________ seconds. Slowly return to the starting position. Repeat __________ times. Complete this exercise __________ times a day. Forearm rotation with weight, pronation  Sit with your left / right forearm supported on a table. Keep your elbow below shoulder height. Gently grasp a lightweight hammer. Rest your hand over the edge of the table with your palm facing up. Without moving your left / right elbow, slowly rotate your forearm to turn your palm down toward the floor (pronation). Hold this position for __________ seconds. Slowly return to the starting position. Repeat __________ times. Complete this exercise __________ times a day. This information is not intended to replace advice given to you by your health care provider. Make sure you discuss any questions you have with your health care provider. Document Revised: 10/09/2021 Document Reviewed: 10/09/2021 Else[employer] Patient Education  2024 [employer] Exercises for Chronic Knee Pain Chronic knee pain is pain that lasts longer than 3 months. For most people with chronic knee pain, exercise and weight loss is an important part of treatment. Your health care provider may want you to focus on: Making the muscles that support your knee stronger. This can take pressure off your knee  and reduce pain. Preventing knee stiffness. How far you can move your knee, keeping it there or making it farther. Losing weight (if this applies) to take pressure off your knee, lower your risk for injury, and make it easier for you to exercise. Your provider will help you make an exercise program that fits your needs and physical abilities. Below are simple, low-impact exercises you can do at home. Ask your provider or physical therapist how often you should do your exercise program and how many times to repeat each exercise. General safety tips  Get your provider's approval before doing any exercises. Start slowly and stop any time you feel pain. Do not exercise if your knee pain is flaring up. Warm up first. Stretching a cold muscle can cause an injury. Do 5-10 minutes of easy movement or light stretching before beginning your exercises. Do 5-10 minutes of low-impact activity (like walking or cycling) before starting strengthening exercises. Contact your provider any time you have pain during or after exercising. Exercise can cause discomfort but should not be painful. It is normal to be a little stiff or sore after exercising. Stretching and range-of-motion exercises Front thigh stretch  Stand up straight and support your body by holding on to a chair or resting one hand on a wall. With your legs straight and close together, bend one knee to lift your heel up toward your butt. Using one hand for support, grab your ankle with your free hand. Pull your foot up closer toward your butt to feel the stretch in front of your thigh. Hold the stretch for 30 seconds. Repeat __________ times. Complete this exercise __________ times a day. Back thigh stretch  Sit on the floor with your back straight and your legs out straight in front of you. Place the palms of your hands on the floor and slide them toward your feet as you bend at the hip. Try to touch your nose to your knees and feel the stretch  in the back of your thighs. Hold for 30 seconds. Repeat __________ times. Complete this exercise __________ times a day. Calf stretch  Stand facing a wall. Place the palms of your hands flat against the wall, arms extended, and lean slightly against the wall. Get into a lunge position with one leg  bent at the knee and the other leg stretched out straight behind you. Keep both feet facing the wall and increase the bend in your knee while keeping the heel of the other leg flat on the ground. You should feel the stretch in your calf. Hold for 30 seconds. Repeat __________ times. Complete this exercise __________ times a day. Strengthening exercises Straight leg lift  Lie on your back with one knee bent and the other leg out straight. Slowly lift the straight leg without bending the knee. Lift until your foot is about 12 inches (30 cm) off the floor. Hold for 3-5 seconds and slowly lower your leg. Repeat __________ times. Complete this exercise __________ times a day. Single leg dip  Stand between two chairs and put both hands on the backs of the chairs for support. Extend one leg out straight with your body weight resting on the heel of the standing leg. Slowly bend your standing knee to dip your body to the level that is comfortable for you. Hold for 3-5 seconds. Repeat __________ times. Complete this exercise __________ times a day. Hamstring curls  Stand straight, knees close together, facing the back of a chair. Hold on to the back of a chair with both hands. Keep one leg straight. Bend the other knee while bringing the heel up toward the butt until the knee is bent at a 90-degree angle (right angle). Hold for 3-5 seconds. Repeat __________ times. Complete this exercise __________ times a day. Wall squat  Stand straight with your back, hips, and head against a wall. Step forward one foot at a time with your back still against the wall. Your feet should be 2 feet (61 cm) from the  wall at shoulder width. Keeping your back, hips, and head against the wall, slide down the wall to as close to a sitting position as you can get. Hold for 5-10 seconds, then slowly slide back up. Repeat __________ times. Complete this exercise __________ times a day. Step-ups  Stand in front of a sturdy platform or stool that is about 6 inches (15 cm) high. Slowly step up with your left / right foot, keeping your knee in line with your hip and foot. Do not let your knee bend so far that you cannot see your toes. Hold on to a chair for balance, but do not use it for support. Slowly unlock your knee and lower yourself to the starting position. Repeat __________ times. Complete this exercise __________ times a day. Contact a health care provider if: Your exercises cause pain. Your pain is worse after you exercise. Your pain prevents you from doing your exercises. This information is not intended to replace advice given to you by your health care provider. Make sure you discuss any questions you have with your health care provider. Document Revised: 01/12/2022 Document Reviewed: 01/12/2022 Else[employer] Patient Education  2024 [employer].

## 2022-07-02 ENCOUNTER — Other Ambulatory Visit (INDEPENDENT_AMBULATORY_CARE_PROVIDER_SITE_OTHER): Payer: Commercial Managed Care - PPO

## 2022-07-02 DIAGNOSIS — E039 Hypothyroidism, unspecified: Secondary | ICD-10-CM | POA: Diagnosis not present

## 2022-07-02 LAB — TSH: TSH: 5.93 mIU/L — ABNORMAL HIGH

## 2022-07-05 ENCOUNTER — Encounter: Payer: Self-pay | Admitting: Family

## 2022-07-05 DIAGNOSIS — E039 Hypothyroidism, unspecified: Secondary | ICD-10-CM

## 2022-07-06 ENCOUNTER — Other Ambulatory Visit (HOSPITAL_BASED_OUTPATIENT_CLINIC_OR_DEPARTMENT_OTHER): Payer: Self-pay

## 2022-07-06 MED ORDER — LEVOTHYROXINE SODIUM 137 MCG PO TABS
137.0000 ug | ORAL_TABLET | Freq: Every day | ORAL | 0 refills | Status: DC
  Filled 2022-07-06 – 2022-09-22 (×2): qty 90, 90d supply, fill #0

## 2022-07-09 NOTE — Progress Notes (Deleted)
Office Visit Note  Patient: Cheyenne Hawkins             Date of Birth: 01/10/1976           MRN: [id number]             PCP: Sandford Craze, NP Referring: Sandford Craze, NP Visit Date: 07/21/2022 Occupation: @GUAROCC @  Subjective:  No chief complaint on file.   History of Present Illness: Cheyenne Hawkins is a 47 y.o. female ***     Activities of Daily Living:  Patient reports morning stiffness for *** {minute/hour:19697}.   Patient {ACTIONS;DENIES/REPORTS:21021675::"Denies"} nocturnal pain.  Difficulty dressing/grooming: {ACTIONS;DENIES/REPORTS:21021675::"Denies"} Difficulty climbing stairs: {ACTIONS;DENIES/REPORTS:21021675::"Denies"} Difficulty getting out of chair: {ACTIONS;DENIES/REPORTS:21021675::"Denies"} Difficulty using hands for taps, buttons, cutlery, and/or writing: {ACTIONS;DENIES/REPORTS:21021675::"Denies"}  No Rheumatology ROS completed.   PMFS History:  Patient Active Problem List   Diagnosis Date Noted   Attention deficit 06/08/2022   Olecranon bursitis of right elbow 04/19/2022   Essential hypertension 03/10/2022   Positive ANA (antinuclear antibody) 12/12/2021   Obesity (BMI 30.0-34.9) 12/08/2021   Perimenopause 12/08/2021   Arthralgia 12/08/2021   Fall 05/01/2021   Screen for STD (sexually transmitted disease) 03/13/2021   Abnormal urine odor 03/13/2021   Onychomycosis 05/01/2020   Paroxysmal atrial fibrillation (HCC) 07/26/2017   Abnormal EKG 06/16/2017   Mitral valve prolapse 06/16/2017   Tachycardia 08/12/2015   Hypothyroidism 06/16/2012   Anemia, iron deficiency 11/19/2011   Skin picking habit 11/05/2011   Preventative health care 08/02/2011   Galactorrhea 08/02/2011   HSV-2 (herpes simplex virus 2) infection 07/09/2011   Depression with anxiety 08/18/2010   IBS (irritable bowel syndrome) 08/18/2010   GERD (gastroesophageal reflux disease) 08/18/2010   Atypical chest pain 04/24/2008   CONSTIPATION 04/19/2008    Past  Medical History:  Diagnosis Date   Anemia, iron deficiency 11/19/2011   Arthritis    Atrial fibrillation (HCC)    with Graves disease    Blood transfusion without reported diagnosis    COVID-19 virus infection 08/15/2020   Depression 2012   Fatty liver 03/21/2011   GERD (gastroesophageal reflux disease)    Grave's disease 2002   s/p thyroidectomy   Heart murmur    MVP   History of chicken pox    HSV-2 (herpes simplex virus 2) infection    Hypertension     Family History  Problem Relation Age of Onset   Alcohol abuse Mother    Depression Mother    Bipolar disorder Mother    Alcohol abuse Father    Diabetes Father    Hypertension Father    Colon polyps Father    CVA Father    Memory loss Father    Hypertension Sister    Depression Sister    ADD / ADHD Brother    ADD / ADHD Brother    Cancer Maternal Grandmother        colon   Depression Maternal Grandmother    Diabetes Maternal Grandmother    Hypertension Maternal Grandmother    Colon cancer Maternal Grandmother    Colon polyps Maternal Grandmother    Cancer Paternal Grandmother        breast   Diabetes Paternal Grandmother    Hypertension Paternal Industrial/product designer Allergies Daughter    Environmental Allergies Daughter    Environmental Allergies Son    Esophageal cancer Neg Hx    Rectal cancer Neg Hx    Stomach cancer Neg Hx    Past Surgical History:  Procedure Laterality  Date   CESAREAN SECTION  2001   CESAREAN SECTION  2003   CESAREAN SECTION  2007   COLONOSCOPY  2010   [facility]    THYROIDECTOMY  2003   for graves disease   TUBAL LIGATION     Social History   Social History Narrative   Regular exercise:  No Caffeine Use: 2 drinks weekly   Right handed    Immunization History  Administered Date(s) Administered   Influenza Split 11/12/2011, 10/11/2021   Influenza,inj,Quad PF,6+ Mos 10/12/2015   Influenza,inj,Quad PF,6-35 Mos 10/29/2018   Influenza-Unspecified 09/11/2013, 10/25/2017,  10/12/2019, 10/29/2019, 10/11/2020   PFIZER(Purple Top)SARS-COV-2 Vaccination 02/05/2019, 02/26/2019   Tdap 09/11/2013     Objective: Vital Signs: There were no vitals taken for this visit.   Physical Exam   Musculoskeletal Exam: ***  CDAI Exam: CDAI Score: -- Patient Global: --; Provider Global: -- Swollen: --; Tender: -- Joint Exam 07/21/2022   No joint exam has been documented for this visit   There is currently no information documented on the homunculus. Go to the Rheumatology activity and complete the homunculus joint exam.  Investigation: No additional findings.  Imaging: No results found.  Recent Labs: Lab Results  Component Value Date   WBC 5.6 12/08/2021   HGB 11.2 (L) 12/08/2021   PLT 357.0 12/08/2021   NA 137 03/19/2022   K 4.2 03/19/2022   CL 101 03/19/2022   CO2 28 03/19/2022   GLUCOSE 95 03/19/2022   BUN 9 03/19/2022   CREATININE 0.61 03/19/2022   BILITOT 0.5 03/13/2021   ALKPHOS 70 03/13/2021   AST 17 03/13/2021   ALT 15 03/13/2021   PROT 7.4 03/13/2021   ALBUMIN 4.2 03/13/2021   CALCIUM 9.5 03/19/2022   GFRAA >89 06/15/2011   QFTBGOLDPLUS NEGATIVE 04/08/2022     Speciality Comments: No specialty comments available.  Procedures:  No procedures performed Allergies: Patient has no known allergies.   Assessment / Plan:     Visit Diagnoses: No diagnosis found.  Orders: No orders of the defined types were placed in this encounter.  No orders of the defined types were placed in this encounter.   Face-to-face time spent with patient was *** minutes. Greater than 50% of time was spent in counseling and coordination of care.  Follow-Up Instructions: No follow-ups on file.   Pollyann Savoy, MD  Note - This record has been created using Animal nutritionist.  Chart creation errors have been sought, but may not always  have been located. Such creation errors do not reflect on  the standard of medical care.

## 2022-07-13 ENCOUNTER — Encounter: Payer: Self-pay | Admitting: Family

## 2022-07-16 ENCOUNTER — Other Ambulatory Visit (HOSPITAL_BASED_OUTPATIENT_CLINIC_OR_DEPARTMENT_OTHER): Payer: Self-pay

## 2022-07-21 ENCOUNTER — Ambulatory Visit: Payer: Commercial Managed Care - PPO | Admitting: Rheumatology

## 2022-08-05 ENCOUNTER — Encounter: Payer: Self-pay | Admitting: Family

## 2022-08-09 NOTE — Progress Notes (Unsigned)
Cheyenne Hawkins is a 47 y.o. female presents to the office today for Polio: injection, per physician's orders. Original order: 08/06/22: Worthy Rancher "Ok please let her know it is available. If she wants to come in, I am happy to order it. " IPV ***  mL (dose),  IM was administered *** Deltoid  today. Patient tolerated injection. Patient due for follow up labs/provider appt: No.  Patient next injection due: ***, appt made {yes/no:20286}  Creft, Melton Alar L

## 2022-08-10 ENCOUNTER — Other Ambulatory Visit: Payer: Self-pay

## 2022-08-10 ENCOUNTER — Other Ambulatory Visit: Payer: Commercial Managed Care - PPO

## 2022-08-10 ENCOUNTER — Ambulatory Visit: Payer: Commercial Managed Care - PPO

## 2022-08-10 DIAGNOSIS — Z23 Encounter for immunization: Secondary | ICD-10-CM

## 2022-08-10 DIAGNOSIS — E039 Hypothyroidism, unspecified: Secondary | ICD-10-CM

## 2022-08-10 NOTE — Progress Notes (Signed)
Plan to hold off on polio vaccine- will check titer first.

## 2022-08-11 DIAGNOSIS — H5213 Myopia, bilateral: Secondary | ICD-10-CM | POA: Diagnosis not present

## 2022-08-19 ENCOUNTER — Other Ambulatory Visit (HOSPITAL_BASED_OUTPATIENT_CLINIC_OR_DEPARTMENT_OTHER): Payer: Self-pay

## 2022-08-19 ENCOUNTER — Encounter: Payer: Self-pay | Admitting: Family

## 2022-08-19 DIAGNOSIS — R41840 Attention and concentration deficit: Secondary | ICD-10-CM | POA: Diagnosis not present

## 2022-08-19 DIAGNOSIS — Z79899 Other long term (current) drug therapy: Secondary | ICD-10-CM | POA: Diagnosis not present

## 2022-08-19 MED ORDER — AMPHETAMINE-DEXTROAMPHET ER 10 MG PO CP24
10.0000 mg | ORAL_CAPSULE | Freq: Every morning | ORAL | 0 refills | Status: DC
  Filled 2022-08-19: qty 30, 30d supply, fill #0

## 2022-08-22 ENCOUNTER — Telehealth: Payer: Self-pay | Admitting: Family

## 2022-08-22 NOTE — Telephone Encounter (Signed)
See mychart.  

## 2022-08-23 ENCOUNTER — Telehealth: Payer: Self-pay | Admitting: Family

## 2022-08-23 DIAGNOSIS — Z79899 Other long term (current) drug therapy: Secondary | ICD-10-CM | POA: Diagnosis not present

## 2022-08-23 DIAGNOSIS — R41840 Attention and concentration deficit: Secondary | ICD-10-CM | POA: Diagnosis not present

## 2022-08-23 NOTE — Telephone Encounter (Signed)
Patient was scheduled for first polio vaccine tomorrow will need 2nd 1-2 months after that and 3rd 6 moths after second.

## 2022-08-23 NOTE — Telephone Encounter (Signed)
Pt said she needs to being polio series. Please place order so patient can be scheduled or advise if patient needs an appt

## 2022-08-24 ENCOUNTER — Encounter: Payer: Commercial Managed Care - PPO | Admitting: Neurology

## 2022-08-24 NOTE — Progress Notes (Deleted)
Patient is here for Polio vaccination per Sandford Craze, NP.  Original order (telephone note 08/22/2022): "Unfortunately,  your polio titer does not show immunity.  Please schedule a nurse visit for the polio booster. We should restart the polio series (IPV) as follows:   The first dose at any time. The second dose 1 or 2 months later. A third dose 6 to 12 months after the second dose."  Polio immunization to *** arm with no apparent complications.   Patient scheduled for follow up with Melissa on 10/19/2022 and can receive second vaccination at that time.

## 2022-08-24 NOTE — Telephone Encounter (Signed)
Patient cancelled today's appointment for polio vaccine/ did not reschedule

## 2022-08-25 NOTE — Progress Notes (Unsigned)
This encounter was created in error - please disregard.

## 2022-09-07 ENCOUNTER — Other Ambulatory Visit (HOSPITAL_BASED_OUTPATIENT_CLINIC_OR_DEPARTMENT_OTHER): Payer: Self-pay

## 2022-09-07 DIAGNOSIS — M25521 Pain in right elbow: Secondary | ICD-10-CM | POA: Diagnosis not present

## 2022-09-07 DIAGNOSIS — M25561 Pain in right knee: Secondary | ICD-10-CM | POA: Diagnosis not present

## 2022-09-07 MED ORDER — PREDNISONE 10 MG PO TABS
ORAL_TABLET | ORAL | 0 refills | Status: AC
  Filled 2022-09-07: qty 21, 6d supply, fill #0

## 2022-09-20 DIAGNOSIS — F902 Attention-deficit hyperactivity disorder, combined type: Secondary | ICD-10-CM | POA: Diagnosis not present

## 2022-09-20 DIAGNOSIS — Z79899 Other long term (current) drug therapy: Secondary | ICD-10-CM | POA: Diagnosis not present

## 2022-09-22 ENCOUNTER — Ambulatory Visit: Payer: Commercial Managed Care - PPO | Admitting: Psychology

## 2022-09-22 ENCOUNTER — Other Ambulatory Visit (HOSPITAL_BASED_OUTPATIENT_CLINIC_OR_DEPARTMENT_OTHER): Payer: Self-pay

## 2022-09-24 ENCOUNTER — Other Ambulatory Visit (HOSPITAL_BASED_OUTPATIENT_CLINIC_OR_DEPARTMENT_OTHER): Payer: Self-pay

## 2022-09-24 ENCOUNTER — Other Ambulatory Visit (HOSPITAL_COMMUNITY): Payer: Self-pay

## 2022-09-24 DIAGNOSIS — Z79899 Other long term (current) drug therapy: Secondary | ICD-10-CM | POA: Diagnosis not present

## 2022-09-24 DIAGNOSIS — F902 Attention-deficit hyperactivity disorder, combined type: Secondary | ICD-10-CM | POA: Diagnosis not present

## 2022-09-24 MED ORDER — AMPHETAMINE-DEXTROAMPHET ER 20 MG PO CP24
20.0000 mg | ORAL_CAPSULE | Freq: Every morning | ORAL | 0 refills | Status: DC
  Filled 2022-09-24: qty 30, 30d supply, fill #0

## 2022-09-28 ENCOUNTER — Other Ambulatory Visit (HOSPITAL_BASED_OUTPATIENT_CLINIC_OR_DEPARTMENT_OTHER): Payer: Self-pay

## 2022-09-29 DIAGNOSIS — F331 Major depressive disorder, recurrent, moderate: Secondary | ICD-10-CM | POA: Diagnosis not present

## 2022-09-29 DIAGNOSIS — F411 Generalized anxiety disorder: Secondary | ICD-10-CM | POA: Diagnosis not present

## 2022-10-19 ENCOUNTER — Ambulatory Visit: Payer: Commercial Managed Care - PPO | Admitting: Family

## 2022-10-28 ENCOUNTER — Other Ambulatory Visit (HOSPITAL_BASED_OUTPATIENT_CLINIC_OR_DEPARTMENT_OTHER): Payer: Self-pay

## 2022-10-29 ENCOUNTER — Other Ambulatory Visit (HOSPITAL_BASED_OUTPATIENT_CLINIC_OR_DEPARTMENT_OTHER): Payer: Self-pay

## 2022-10-29 MED ORDER — AMPHETAMINE-DEXTROAMPHET ER 30 MG PO CP24
30.0000 mg | ORAL_CAPSULE | Freq: Every morning | ORAL | 0 refills | Status: DC
  Filled 2022-10-29: qty 30, 30d supply, fill #0

## 2022-10-29 MED ORDER — INFLUENZA VIRUS VACC SPLIT PF (FLUZONE) 0.5 ML IM SUSY
0.5000 mL | PREFILLED_SYRINGE | Freq: Once | INTRAMUSCULAR | 0 refills | Status: AC
  Filled 2022-10-29: qty 0.5, 1d supply, fill #0

## 2022-11-18 ENCOUNTER — Encounter: Payer: Self-pay | Admitting: Family

## 2022-11-19 NOTE — Telephone Encounter (Signed)
Patient was scheduled to come in 12/26/22 at her request

## 2022-11-25 ENCOUNTER — Encounter (HOSPITAL_BASED_OUTPATIENT_CLINIC_OR_DEPARTMENT_OTHER): Payer: Self-pay

## 2022-11-25 ENCOUNTER — Ambulatory Visit (HOSPITAL_BASED_OUTPATIENT_CLINIC_OR_DEPARTMENT_OTHER)
Admission: RE | Admit: 2022-11-25 | Discharge: 2022-11-25 | Disposition: A | Discharge status: Home / Self Care | Payer: Commercial Managed Care - PPO | Source: Ambulatory Visit | Attending: Family | Admitting: Family

## 2022-11-25 DIAGNOSIS — Z1231 Encounter for screening mammogram for malignant neoplasm of breast: Secondary | ICD-10-CM | POA: Insufficient documentation

## 2022-11-26 ENCOUNTER — Ambulatory Visit: Payer: Commercial Managed Care - PPO | Admitting: Family

## 2022-11-26 VITALS — BP 125/79 | HR 124 | Temp 98.0°F | Resp 16 | Ht 64.0 in | Wt 180.0 lb

## 2022-11-26 DIAGNOSIS — R000 Tachycardia, unspecified: Secondary | ICD-10-CM | POA: Diagnosis not present

## 2022-11-26 DIAGNOSIS — D500 Iron deficiency anemia secondary to blood loss (chronic): Secondary | ICD-10-CM | POA: Diagnosis not present

## 2022-11-26 DIAGNOSIS — N920 Excessive and frequent menstruation with regular cycle: Secondary | ICD-10-CM | POA: Diagnosis not present

## 2022-11-26 DIAGNOSIS — E039 Hypothyroidism, unspecified: Secondary | ICD-10-CM | POA: Diagnosis not present

## 2022-11-26 LAB — POCT URINE PREGNANCY: Preg Test, Ur: NEGATIVE

## 2022-11-26 LAB — POCT HEMOGLOBIN: Hemoglobin: 12 g/dL (ref 11–14.6)

## 2022-11-26 NOTE — Assessment & Plan Note (Addendum)
New.  Will refer to GYN.  Urine HCG is negative.

## 2022-11-26 NOTE — Assessment & Plan Note (Signed)
Need to exclude overtreated hypothyroid as contributor to her tachycardia.

## 2022-11-26 NOTE — Assessment & Plan Note (Signed)
Hgb 12 by hemacue in the office today. Obtain full CBC, continue MVI with minerals.

## 2022-11-26 NOTE — Assessment & Plan Note (Signed)
New.  EKG was personally reviewed. HR 110, ? Atrial Flutter.  I reviewed the EKG with Cardiology and he did not feel that the EKG represented Atrial Flutter, but instead sinus tachycardia.  Advised pt to monitor her HR at home.  Will check electrolytes, cbc.  In the meantime, I have asked her to hold adderall as this can be a contributing factor to her tachycardia.  I have asked her to send me her resting HR via mychart on Monday. She is advised to go to the ER if she develops palpitations, SOB or HR >120.  She verbalizes understanding.

## 2022-11-26 NOTE — Progress Notes (Signed)
Subjective:     Patient ID: Cheyenne Hawkins, female    DOB: 08-20-1975, 47 y.o.   MRN: [id number]  Chief Complaint  Patient presents with   Menorrhagia    Patient reports irregular periods since July. Last one 10/23 lasted 2 weeks.    Irregular Heart Beat    Patient reports heart rate going up with or without activity    HPI  Discussed the use of AI scribe software for clinical note transcription with the patient, who gave verbal consent to proceed.  History of Present Illness   The patient presents with a recent episode of heavy menstrual bleeding that lasted approximately two weeks. The bleeding started on October 23rd and was initially thought to be a normal period. However, the bleeding persisted and was associated with the passage of clots for about a week or two. The patient describes the clots. This caused significant inconvenience, especially at work. The heavy bleeding and clot passage have since resolved. The patient denies any previous episodes of heavy periods. She has a history of small fibroids diagnosed during childbearing years but has not been under the care of a gynecologist recently.  The patient also reports feeling "wound up" and attributes this to her Adderall medication. She denies any shortness of breath. She has a history of tubal ligation. She also has a history of thyroid disease and is maintained on synthroid.       Health Maintenance Due  Topic Date Due   COVID-19 Vaccine (3 - 2023-24 season) 09/12/2022    Past Medical History:  Diagnosis Date   Anemia, iron deficiency 11/19/2011   Arthritis    Atrial fibrillation (HCC)    with Graves disease    Blood transfusion without reported diagnosis    COVID-19 virus infection 08/15/2020   Depression 2012   Fatty liver 03/21/2011   GERD (gastroesophageal reflux disease)    Grave's disease 2002   s/p thyroidectomy   Heart murmur    MVP   History of chicken pox    HSV-2 (herpes simplex virus 2) infection     Hypertension     Past Surgical History:  Procedure Laterality Date   CESAREAN SECTION  2001   CESAREAN SECTION  2003   CESAREAN SECTION  2007   COLONOSCOPY  2010   [facility]    THYROIDECTOMY  2003   for graves disease   TUBAL LIGATION      Family History  Problem Relation Age of Onset   Alcohol abuse Mother    Depression Mother    Bipolar disorder Mother    Alcohol abuse Father    Diabetes Father    Hypertension Father    Colon polyps Father    CVA Father    Memory loss Father    Hypertension Sister    Depression Sister    ADD / ADHD Brother    ADD / ADHD Brother    Cancer Maternal Grandmother        colon   Depression Maternal Grandmother    Diabetes Maternal Grandmother    Hypertension Maternal Grandmother    Colon cancer Maternal Grandmother    Colon polyps Maternal Grandmother    Cancer Paternal Grandmother        breast   Diabetes Paternal Grandmother    Hypertension Paternal Grandmother    Environmental Allergies Daughter    Environmental Allergies Daughter    Environmental Allergies Son    Esophageal cancer Neg Hx    Rectal cancer Neg  Hx    Stomach cancer Neg Hx     Social History   Socioeconomic History   Marital status: Married    Spouse name: Not on file   Number of children: 3   Years of education: Not on file   Highest education level: Not on file  Occupational History    Employer: [employer]  Tobacco Use   Smoking status: Never    Passive exposure: Past   Smokeless tobacco: Never  Vaping Use   Vaping status: Never Used  Substance and Sexual Activity   Alcohol use: Yes    Alcohol/week: 3.0 standard drinks of alcohol    Types: 3 Glasses of wine per week   Drug use: No   Sexual activity: Yes    Birth control/protection: Surgical    Comment: btl  Other Topics Concern   Not on file  Social History Narrative   Regular exercise:  No Caffeine Use: 2 drinks weekly   Right handed    Social Determinants of Health   Financial Resource  Strain: Not on file  Food Insecurity: Not on file  Transportation Needs: Not on file  Physical Activity: Not on file  Stress: Not on file  Social Connections: Not on file  Intimate Partner Violence: Not on file    Outpatient Medications Prior to Visit  Medication Sig Dispense Refill   amphetamine-dextroamphetamine (ADDERALL XR) 10 MG 24 hr capsule Take 1 capsule by mouth in the morning. 30 capsule 0   amphetamine-dextroamphetamine (ADDERALL XR) 20 MG 24 hr capsule Take 1 capsule (20 mg total) by mouth in the morning. 30 capsule 0   amphetamine-dextroamphetamine (ADDERALL XR) 30 MG 24 hr capsule Take 1 capsule (30 mg total) by mouth in the morning. 30 capsule 0   buPROPion (WELLBUTRIN XL) 150 MG 24 hr tablet Take 1 tablet (150 mg total) by mouth daily. 90 tablet 1   escitalopram (LEXAPRO) 20 MG tablet Take 1 tablet (20 mg total) by mouth daily. 90 tablet 1   levothyroxine (SYNTHROID) 137 MCG tablet Take 1 tablet (137 mcg total) by mouth daily before breakfast. 90 tablet 0   Multiple Vitamins-Minerals (MULTIVITAMIN WITH MINERALS) tablet Take 1 tablet by mouth daily.     valACYclovir (VALTREX) 1000 MG tablet Take 1 tablet (1,000 mg total) by mouth daily as needed for 5 days for outbreak. 20 tablet 5   No facility-administered medications prior to visit.    No Known Allergies  ROS    See HPI Objective:    Physical Exam Constitutional:      General: She is not in acute distress.    Appearance: Normal appearance. She is well-developed.  HENT:     Head: Normocephalic and atraumatic.     Right Ear: External ear normal.     Left Ear: External ear normal.  Eyes:     General: No scleral icterus. Neck:     Thyroid: No thyromegaly.  Cardiovascular:     Rate and Rhythm: Regular rhythm. Tachycardia present.     Heart sounds: Normal heart sounds. No murmur heard. Pulmonary:     Effort: Pulmonary effort is normal. No respiratory distress.     Breath sounds: Normal breath sounds. No  wheezing.  Musculoskeletal:     Cervical back: Neck supple.  Skin:    General: Skin is warm and dry.  Neurological:     Mental Status: She is alert and oriented to person, place, and time.  Psychiatric:        Mood  and Affect: Mood normal.        Behavior: Behavior normal.        Thought Content: Thought content normal.        Judgment: Judgment normal.      BP 125/79 (BP Location: Right Arm, Patient Position: Sitting, Cuff Size: Normal)   Pulse (!) 124   Temp 98 F (36.7 C) (Oral)   Resp 16   Ht 5\' 4"  (1.626 m)   Wt 180 lb (81.6 kg)   SpO2 100%   BMI 30.90 kg/m  Wt Readings from Last 3 Encounters:  11/26/22 180 lb (81.6 kg)  06/24/22 184 lb (83.5 kg)  04/19/22 184 lb (83.5 kg)       Assessment & Plan:   Problem List Items Addressed This Visit       Unprioritized   Anemia, iron deficiency (Chronic)    Hgb 12 by hemacue in the office today. Obtain full CBC, continue MVI with minerals.       Tachycardia - Primary    New.  EKG was personally reviewed. HR 110, ? Atrial Flutter.  I reviewed the EKG with Cardiology and he did not feel that the EKG represented Atrial Flutter, but instead sinus tachycardia.  Advised pt to monitor her HR at home.  Will check electrolytes, cbc.  In the meantime, I have asked her to hold adderall as this can be a contributing factor to her tachycardia.  I have asked her to send me her resting HR via mychart on Monday. She is advised to go to the ER if she develops palpitations, SOB or HR >120.  She verbalizes understanding.      Relevant Orders   POCT urine pregnancy (Completed)   EKG 12-Lead (Completed)   CBC w/Diff   Comp Met (CMET)   Menorrhagia with regular cycle    New.  Will refer to GYN.  Urine HCG is negative.       Relevant Orders   POCT hemoglobin (Completed)   Ambulatory referral to Obstetrics / Gynecology   Hypothyroidism    Need to exclude overtreated hypothyroid as contributor to her tachycardia.       Relevant Orders    TSH    I am having Cheyenne Herter. Hawkins maintain her multivitamin with minerals, escitalopram, valACYclovir, buPROPion, levothyroxine, amphetamine-dextroamphetamine, amphetamine-dextroamphetamine, and amphetamine-dextroamphetamine.  No orders of the defined types were placed in this encounter.

## 2022-11-26 NOTE — Patient Instructions (Signed)
VISIT SUMMARY:  Today, you were seen for a recent episode of heavy menstrual bleeding that lasted about two weeks and has now resolved. You also mentioned feeling unusually wound up, which you think might be due to your Adderall medication. We discussed your history of small fibroids and thyroid disease, and you mentioned not having a gynecologist currently.  YOUR PLAN:  -MENORRHAGIA: Menorrhagia is heavy menstrual bleeding. You experienced this recently for about two weeks, which has now resolved. Your hemoglobin shows mild anemia. Please continue your multivitamin with minerals. Your urine pregnancy test is negative. We have placed a referral to a gynecologist  for further evaluation and management.  -TACHYCARDIA: Tachycardia is an elevated heart rate. This could be related to your Adderall use. Your EKG shows sinus tachycardia. go to the ER if you develop palpitations, shortness of breath or resting heart rate >120. Please hold your adderall and send me your resting heart rate via mychart on Monday.

## 2022-11-27 LAB — COMPREHENSIVE METABOLIC PANEL
AG Ratio: 1.5 (calc) (ref 1.0–2.5)
ALT: 13 U/L (ref 6–29)
AST: 16 U/L (ref 10–35)
Albumin: 4.7 g/dL (ref 3.6–5.1)
Alkaline phosphatase (APISO): 79 U/L (ref 31–125)
BUN: 13 mg/dL (ref 7–25)
CO2: 30 mmol/L (ref 20–32)
Calcium: 9.9 mg/dL (ref 8.6–10.2)
Chloride: 102 mmol/L (ref 98–110)
Creat: 0.76 mg/dL (ref 0.50–0.99)
Globulin: 3.1 g/dL (ref 1.9–3.7)
Glucose, Bld: 108 mg/dL — ABNORMAL HIGH (ref 65–99)
Potassium: 4.6 mmol/L (ref 3.5–5.3)
Sodium: 140 mmol/L (ref 135–146)
Total Bilirubin: 0.5 mg/dL (ref 0.2–1.2)
Total Protein: 7.8 g/dL (ref 6.1–8.1)

## 2022-11-27 LAB — CBC WITH DIFFERENTIAL/PLATELET
Absolute Lymphocytes: 1679 {cells}/uL (ref 850–3900)
Absolute Monocytes: 621 {cells}/uL (ref 200–950)
Basophils Absolute: 51 {cells}/uL (ref 0–200)
Basophils Relative: 0.7 %
Eosinophils Absolute: 44 {cells}/uL (ref 15–500)
Eosinophils Relative: 0.6 %
HCT: 36.5 % (ref 35.0–45.0)
Hemoglobin: 11.3 g/dL — ABNORMAL LOW (ref 11.7–15.5)
MCH: 26.7 pg — ABNORMAL LOW (ref 27.0–33.0)
MCHC: 31 g/dL — ABNORMAL LOW (ref 32.0–36.0)
MCV: 86.1 fL (ref 80.0–100.0)
MPV: 10 fL (ref 7.5–12.5)
Monocytes Relative: 8.5 %
Neutro Abs: 4906 {cells}/uL (ref 1500–7800)
Neutrophils Relative %: 67.2 %
Platelets: 441 10*3/uL — ABNORMAL HIGH (ref 140–400)
RBC: 4.24 10*6/uL (ref 3.80–5.10)
RDW: 13.7 % (ref 11.0–15.0)
Total Lymphocyte: 23 %
WBC: 7.3 10*3/uL (ref 3.8–10.8)

## 2022-11-27 LAB — TSH: TSH: 1.89 m[IU]/L

## 2022-11-29 ENCOUNTER — Other Ambulatory Visit: Payer: Self-pay | Admitting: Family

## 2022-11-29 ENCOUNTER — Encounter: Payer: Self-pay | Admitting: Family

## 2022-11-29 DIAGNOSIS — R000 Tachycardia, unspecified: Secondary | ICD-10-CM

## 2022-12-08 DIAGNOSIS — G8929 Other chronic pain: Secondary | ICD-10-CM | POA: Insufficient documentation

## 2022-12-13 ENCOUNTER — Encounter: Payer: Self-pay | Admitting: Cardiology

## 2022-12-13 ENCOUNTER — Other Ambulatory Visit (HOSPITAL_BASED_OUTPATIENT_CLINIC_OR_DEPARTMENT_OTHER): Payer: Self-pay

## 2022-12-13 ENCOUNTER — Ambulatory Visit: Payer: Commercial Managed Care - PPO | Attending: Cardiology | Admitting: Cardiology

## 2022-12-13 ENCOUNTER — Ambulatory Visit: Payer: Commercial Managed Care - PPO

## 2022-12-13 VITALS — BP 120/66 | HR 128 | Ht 64.0 in | Wt 183.0 lb

## 2022-12-13 DIAGNOSIS — I10 Essential (primary) hypertension: Secondary | ICD-10-CM | POA: Diagnosis not present

## 2022-12-13 DIAGNOSIS — R0789 Other chest pain: Secondary | ICD-10-CM

## 2022-12-13 DIAGNOSIS — I480 Paroxysmal atrial fibrillation: Secondary | ICD-10-CM

## 2022-12-13 DIAGNOSIS — R000 Tachycardia, unspecified: Secondary | ICD-10-CM

## 2022-12-13 MED ORDER — METOPROLOL TARTRATE 25 MG PO TABS
12.5000 mg | ORAL_TABLET | Freq: Two times a day (BID) | ORAL | 3 refills | Status: DC
  Filled 2022-12-13: qty 90, 90d supply, fill #0

## 2022-12-13 NOTE — Progress Notes (Signed)
Cardiology Consultation:    Date:  12/13/2022   ID:  Cheyenne Hawkins, DOB 1975/07/23, MRN [id number]  PCP:  Sandford Craze, NP  Cardiologist:  Gypsy Balsam, MD   Referring MD: Sandford Craze, NP   Chief Complaint  Patient presents with   Tachycardia    History of Present Illness:    Cheyenne Hawkins is a 47 y.o. female who is being seen today for the evaluation of tachycardia at the request of Sandford Craze, NP.  I did see her more than 4 years ago when she visited me with history of mitral valve prolapse.  She also got history of hyper thyroidism, status post surgery now hypothyroidism.  She was referred to me because of palpitations she feels.  She feels her heart speeding up.  She also was noted to have persistent sinus tachycardia.  Denies have any chest pain tightness squeezing pressure burning chest discomfort shortness of breath.  She still can do what she wants to do.  She was taking Adderall but that being discontinued after she was discovered to have sinus tachycardia.  No chest pain no tightness no squeezing no pressure no burning in the chest  Past Medical History:  Diagnosis Date   Anemia, iron deficiency 11/19/2011   Arthritis    Atrial fibrillation (HCC)    with Graves disease    Blood transfusion without reported diagnosis    COVID-19 virus infection 08/15/2020   Depression 2012   Fatty liver 03/21/2011   GERD (gastroesophageal reflux disease)    Grave's disease 2002   s/p thyroidectomy   Heart murmur    MVP   History of chicken pox    HSV-2 (herpes simplex virus 2) infection    Hypertension     Past Surgical History:  Procedure Laterality Date   CESAREAN SECTION  2001   CESAREAN SECTION  2003   CESAREAN SECTION  2007   COLONOSCOPY  2010   [facility]    THYROIDECTOMY  2003   for graves disease   TUBAL LIGATION      Current Medications: Current Meds  Medication Sig   buPROPion (WELLBUTRIN XL) 150 MG 24 hr tablet Take 1 tablet (150  mg total) by mouth daily.   escitalopram (LEXAPRO) 20 MG tablet Take 1 tablet (20 mg total) by mouth daily.   levothyroxine (SYNTHROID) 137 MCG tablet Take 1 tablet (137 mcg total) by mouth daily before breakfast.   metoprolol tartrate (LOPRESSOR) 25 MG tablet Take 0.5 tablets (12.5 mg total) by mouth 2 (two) times daily.   Multiple Vitamins-Minerals (MULTIVITAMIN WITH MINERALS) tablet Take 1 tablet by mouth daily.   valACYclovir (VALTREX) 1000 MG tablet Take 1 tablet (1,000 mg total) by mouth daily as needed for 5 days for outbreak.     Allergies:   Patient has no known allergies.   Social History   Socioeconomic History   Marital status: Married    Spouse name: Not on file   Number of children: 3   Years of education: Not on file   Highest education level: Not on file  Occupational History    Employer: [employer]  Tobacco Use   Smoking status: Never    Passive exposure: Past   Smokeless tobacco: Never  Vaping Use   Vaping status: Never Used  Substance and Sexual Activity   Alcohol use: Yes    Alcohol/week: 3.0 standard drinks of alcohol    Types: 3 Glasses of wine per week   Drug use: No  Sexual activity: Yes    Birth control/protection: Surgical    Comment: btl  Other Topics Concern   Not on file  Social History Narrative   Regular exercise:  No Caffeine Use: 2 drinks weekly   Right handed    Social Determinants of Health   Financial Resource Strain: Not on file  Food Insecurity: Not on file  Transportation Needs: Not on file  Physical Activity: Not on file  Stress: Not on file  Social Connections: Not on file     Family History: The patient's family history includes ADD / ADHD in her brother and brother; Alcohol abuse in her father and mother; Bipolar disorder in her mother; CVA in her father; Cancer in her maternal grandmother and paternal grandmother; Colon cancer in her maternal grandmother; Colon polyps in her father and maternal grandmother; Depression in  her maternal grandmother, mother, and sister; Diabetes in her father, maternal grandmother, and paternal grandmother; Environmental Allergies in her daughter, daughter, and son; Hypertension in her father, maternal grandmother, paternal grandmother, and sister; Memory loss in her father. There is no history of Esophageal cancer, Rectal cancer, or Stomach cancer. ROS:   Please see the history of present illness.    All 14 point review of systems negative except as described per history of present illness.  EKGs/Labs/Other Studies Reviewed:    The following studies were reviewed today:   EKG:       Recent Labs: 11/26/2022: ALT 13; BUN 13; Creat 0.76; Hemoglobin 11.3; Platelets 441; Potassium 4.6; Sodium 140; TSH 1.89  Recent Lipid Panel    Component Value Date/Time   CHOL 127 04/19/2022 1214   TRIG 49.0 04/19/2022 1214   HDL 53.30 04/19/2022 1214   CHOLHDL 2 04/19/2022 1214   VLDL 9.8 04/19/2022 1214   LDLCALC 64 04/19/2022 1214   LDLCALC 85 09/01/2018 1501    Physical Exam:    VS:  BP 120/66 (BP Location: Left Arm, Patient Position: Sitting)   Pulse (!) 128   Ht 5\' 4"  (1.626 m)   Wt 183 lb (83 kg)   SpO2 99%   BMI 31.41 kg/m     Wt Readings from Last 3 Encounters:  12/13/22 183 lb (83 kg)  11/26/22 180 lb (81.6 kg)  06/24/22 184 lb (83.5 kg)     GEN:  Well nourished, well developed in no acute distress HEENT: Normal NECK: No JVD; No carotid bruits LYMPHATICS: No lymphadenopathy CARDIAC: RRR, no murmurs, no rubs, no gallops RESPIRATORY:  Clear to auscultation without rales, wheezing or rhonchi  ABDOMEN: Soft, non-tender, non-distended MUSCULOSKELETAL:  No edema; No deformity  SKIN: Warm and dry NEUROLOGIC:  Alert and oriented x 3 PSYCHIATRIC:  Normal affect   ASSESSMENT:    1. Tachycardia   2. Paroxysmal atrial fibrillation (HCC)   3. Essential hypertension   4. Atypical chest pain    PLAN:    In order of problems listed above:  Tachycardia clearly sinus  did have her connected to the monitor asking to take a deep breath and do a Valsalva maneuver which showed significant slowing down her heart rate to less than 100.  I will ask her to start taking small dose of beta-blocker only 12.5 on Toprol twice daily which previously she tried but higher dose and became hypotensive hopefully should be able to tolerate smaller dose in the meantime we will get her echocardiogram to assess left ventricular ejection fraction.  I do see significant fluctuation of her TSH that could be the problem  but I will not alter any of her medication I will think we have cardiac workup completed.  She will also wear a monitor for 2 weeks first part of the monitor will be without medication second with medications. Essential hypertension blood pressure seems to be controlled right now continue present management. History of hypothyroidism.  Plan as described above   Medication Adjustments/Labs and Tests Ordered: Current medicines are reviewed at length with the patient today.  Concerns regarding medicines are outlined above.  Orders Placed This Encounter  Procedures   LONG TERM MONITOR (3-14 DAYS)   EKG 12-Lead   ECHOCARDIOGRAM COMPLETE   Meds ordered this encounter  Medications   metoprolol tartrate (LOPRESSOR) 25 MG tablet    Sig: Take 0.5 tablets (12.5 mg total) by mouth 2 (two) times daily.    Dispense:  90 tablet    Refill:  3    Signed, Georgeanna Lea, MD, [facility]. 12/13/2022 4:28 PM    [facility]

## 2022-12-13 NOTE — Patient Instructions (Addendum)
Medication Instructions:   START: Metoprolol Tartrate 12.5mg  twice daily   Lab Work: None Ordered If you have labs (blood work) drawn today and your tests are completely normal, you will receive your results only by: MyChart Message (if you have MyChart) OR A paper copy in the mail If you have any lab test that is abnormal or we need to change your treatment, we will call you to review the results.   Testing/Procedures: Your physician has requested that you have an echocardiogram. Echocardiography is a painless test that uses sound waves to create images of your heart. It provides your doctor with information about the size and shape of your heart and how well your heart's chambers and valves are working. This procedure takes approximately one hour. There are no restrictions for this procedure. Please do NOT wear cologne, perfume, aftershave, or lotions (deodorant is allowed). Please arrive 15 minutes prior to your appointment time.   WHY IS MY DOCTOR PRESCRIBING ZIO? The Zio system is proven and trusted by physicians to detect and diagnose irregular heart rhythms -- and has been prescribed to hundreds of thousands of patients.  The FDA has cleared the Zio system to monitor for many different kinds of irregular heart rhythms. In a study, physicians were able to reach a diagnosis 90% of the time with the Zio system1.  You can wear the Zio monitor -- a small, discreet, comfortable patch -- during your normal day-to-day activity, including while you sleep, shower, and exercise, while it records every single heartbeat for analysis.  1Barrett, P., et al. Comparison of 24 Hour Holter Monitoring Versus 14 Day Novel Adhesive Patch Electrocardiographic Monitoring. American Journal of Medicine, 2014.  ZIO VS. HOLTER MONITORING The Zio monitor can be comfortably worn for up to 14 days. Holter monitors can be worn for 24 to 48 hours, limiting the time to record any irregular heart rhythms you may  have. Zio is able to capture data for the 51% of patients who have their first symptom-triggered arrhythmia after 48 hours.1  LIVE WITHOUT RESTRICTIONS The Zio ambulatory cardiac monitor is a small, unobtrusive, and water-resistant patch--you might even forget you're wearing it. The Zio monitor records and stores every beat of your heart, whether you're sleeping, working out, or showering.    Please note: We ask at that you not bring children with you during ultrasound (echo/ vascular) testing. Due to room size and safety concerns, children are not allowed in the ultrasound rooms during exams. Our front office staff cannot provide observation of children in our lobby area while testing is being conducted. An adult accompanying a patient to their appointment will only be allowed in the ultrasound room at the discretion of the ultrasound technician under special circumstances. We apologize for any inconvenience.    Follow-Up: At [facility], you and your health needs are our priority.  As part of our continuing mission to provide you with exceptional heart care, we have created designated Provider Care Teams.  These Care Teams include your primary Cardiologist (physician) and Advanced Practice Providers (APPs -  Physician Assistants and Nurse Practitioners) who all work together to provide you with the care you need, when you need it.  We recommend signing up for the patient portal called "MyChart".  Sign up information is provided on this After Visit Summary.  MyChart is used to connect with patients for Virtual Visits (Telemedicine).  Patients are able to view lab/test results, encounter notes, upcoming appointments, etc.  Non-urgent messages can be sent  to your provider as well.   To learn more about what you can do with MyChart, go to [email or web address].    Your next appointment:   6 week(s)  The format for your next appointment:   In Person  Provider:   Gypsy Balsam, MD     Other Instructions NA

## 2022-12-16 ENCOUNTER — Other Ambulatory Visit: Payer: Self-pay | Admitting: Family

## 2022-12-16 ENCOUNTER — Other Ambulatory Visit: Payer: Self-pay

## 2022-12-16 ENCOUNTER — Other Ambulatory Visit (HOSPITAL_BASED_OUTPATIENT_CLINIC_OR_DEPARTMENT_OTHER): Payer: Self-pay

## 2022-12-16 MED ORDER — BUPROPION HCL ER (XL) 150 MG PO TB24
150.0000 mg | ORAL_TABLET | Freq: Every day | ORAL | 1 refills | Status: DC
  Filled 2022-12-16: qty 90, 90d supply, fill #0
  Filled 2023-03-02: qty 90, 90d supply, fill #1

## 2022-12-22 ENCOUNTER — Other Ambulatory Visit (HOSPITAL_BASED_OUTPATIENT_CLINIC_OR_DEPARTMENT_OTHER): Payer: Self-pay

## 2022-12-30 ENCOUNTER — Ambulatory Visit (HOSPITAL_BASED_OUTPATIENT_CLINIC_OR_DEPARTMENT_OTHER)
Admission: RE | Admit: 2022-12-30 | Discharge: 2022-12-30 | Disposition: A | Discharge status: Home / Self Care | Payer: Commercial Managed Care - PPO | Source: Ambulatory Visit | Attending: Cardiology | Admitting: Cardiology

## 2022-12-30 DIAGNOSIS — I10 Essential (primary) hypertension: Secondary | ICD-10-CM | POA: Diagnosis not present

## 2022-12-30 DIAGNOSIS — R000 Tachycardia, unspecified: Secondary | ICD-10-CM

## 2022-12-30 LAB — ECHOCARDIOGRAM COMPLETE
AR max vel: 1.98 cm2
AV Area VTI: 2.26 cm2
AV Area mean vel: 2.08 cm2
AV Mean grad: 6 mm[Hg]
AV Peak grad: 11.7 mm[Hg]
Ao pk vel: 1.71 m/s
Area-P 1/2: 4.36 cm2
Calc EF: 67.4 %
MV M vel: 4.58 m/s
MV Peak grad: 83.9 mm[Hg]
S' Lateral: 2.5 cm
Single Plane A2C EF: 61.7 %
Single Plane A4C EF: 74.1 %

## 2023-01-05 DIAGNOSIS — R000 Tachycardia, unspecified: Secondary | ICD-10-CM | POA: Diagnosis not present

## 2023-01-06 ENCOUNTER — Telehealth: Payer: Self-pay

## 2023-01-06 NOTE — Telephone Encounter (Signed)
Left message on My Chart with normal results per Dr. Krasowski's note. Routed to PCP. 

## 2023-01-06 NOTE — Telephone Encounter (Signed)
 Pt viewed Echo results on My Chart per Dr. Vanetta Shawl note. Routed to PCP.

## 2023-01-10 ENCOUNTER — Other Ambulatory Visit (HOSPITAL_BASED_OUTPATIENT_CLINIC_OR_DEPARTMENT_OTHER): Payer: Self-pay

## 2023-01-10 ENCOUNTER — Other Ambulatory Visit: Payer: Self-pay | Admitting: Family

## 2023-01-10 MED ORDER — LEVOTHYROXINE SODIUM 137 MCG PO TABS
137.0000 ug | ORAL_TABLET | Freq: Every day | ORAL | 0 refills | Status: DC
  Filled 2023-01-10: qty 90, 90d supply, fill #0

## 2023-01-26 ENCOUNTER — Telehealth: Payer: Self-pay

## 2023-01-26 NOTE — Telephone Encounter (Signed)
Left message on My Chart with monitor results per Dr. Vanetta Shawl note. Routed to PCP.

## 2023-01-28 ENCOUNTER — Ambulatory Visit: Payer: Commercial Managed Care - PPO | Admitting: Cardiology

## 2023-02-08 ENCOUNTER — Telehealth: Payer: Self-pay

## 2023-02-08 NOTE — Telephone Encounter (Signed)
Called patient to schedule new patient appointment. Left voicemail with our contact information to call back and schedule.

## 2023-02-11 ENCOUNTER — Encounter: Payer: Self-pay | Admitting: Cardiology

## 2023-02-11 ENCOUNTER — Ambulatory Visit: Payer: Commercial Managed Care - PPO | Attending: Cardiology | Admitting: Cardiology

## 2023-02-11 ENCOUNTER — Other Ambulatory Visit (HOSPITAL_BASED_OUTPATIENT_CLINIC_OR_DEPARTMENT_OTHER): Payer: Self-pay

## 2023-02-11 VITALS — BP 102/72 | HR 90 | Ht 65.0 in | Wt 184.0 lb

## 2023-02-11 DIAGNOSIS — I10 Essential (primary) hypertension: Secondary | ICD-10-CM | POA: Diagnosis not present

## 2023-02-11 DIAGNOSIS — R000 Tachycardia, unspecified: Secondary | ICD-10-CM

## 2023-02-11 DIAGNOSIS — I341 Nonrheumatic mitral (valve) prolapse: Secondary | ICD-10-CM | POA: Diagnosis not present

## 2023-02-11 DIAGNOSIS — E039 Hypothyroidism, unspecified: Secondary | ICD-10-CM | POA: Diagnosis not present

## 2023-02-11 MED ORDER — METOPROLOL SUCCINATE ER 25 MG PO TB24
25.0000 mg | ORAL_TABLET | Freq: Every day | ORAL | 3 refills | Status: AC
  Filled 2023-02-11: qty 90, 90d supply, fill #0
  Filled 2023-05-31: qty 90, 90d supply, fill #1
  Filled 2023-09-16: qty 90, 90d supply, fill #2
  Filled 2023-12-28: qty 90, 90d supply, fill #3

## 2023-02-11 NOTE — Patient Instructions (Addendum)
Medication Instructions:   STOP: Metoprolol Tartrate  START: Metoprolol Succinate 25 1 tablet daily   Lab Work: None Ordered If you have labs (blood work) drawn today and your tests are completely normal, you will receive your results only by: MyChart Message (if you have MyChart) OR A paper copy in the mail If you have any lab test that is abnormal or we need to change your treatment, we will call you to review the results.   Testing/Procedures: None Ordered   Follow-Up: At [facility], you and your health needs are our priority.  As part of our continuing mission to provide you with exceptional heart care, we have created designated Provider Care Teams.  These Care Teams include your primary Cardiologist (physician) and Advanced Practice Providers (APPs -  Physician Assistants and Nurse Practitioners) who all work together to provide you with the care you need, when you need it.  We recommend signing up for the patient portal called "MyChart".  Sign up information is provided on this After Visit Summary.  MyChart is used to connect with patients for Virtual Visits (Telemedicine).  Patients are able to view lab/test results, encounter notes, upcoming appointments, etc.  Non-urgent messages can be sent to your provider as well.   To learn more about what you can do with MyChart, go to [email or web address].    Your next appointment:   6 month(s)  The format for your next appointment:   In Person  Provider:   Gypsy Balsam, MD    Other Instructions NA

## 2023-02-11 NOTE — Progress Notes (Signed)
Cardiology Office Note:    Date:  02/11/2023   ID:  [name], DOB 1976/01/10, MRN [id number]  PCP:  Sandford Craze, NP  Cardiologist:  Gypsy Balsam, MD    Referring MD: Sandford Craze, NP   Chief Complaint  Patient presents with   Follow-up    History of Present Illness:    Cheyenne Hawkins is a 48 y.o. female past medical history significant for sinus tachycardia, mitral valve prolapse, hypothyroidism status postsurgery now hypothyroidism.  She was sent to [place] because of palpitations.  She felt her heart speeding up.  Denies having any chest pain tightness squeezing pressure burning chest.  Evaluation included echocardiogram showed normal left ventricular ejection fraction without significant pathology, also she wear Zio patch which showed sinus tachycardia with average heart rate within 2 weeks of 97.  After that I gave her a small dose of beta-blocker which seems to be helping quite a bit.  Comes today to my office for follow-up denies have any chest pain tightness squeezing pressure burning chest palpitations are better  Past Medical History:  Diagnosis Date   Anemia, iron deficiency 11/19/2011   Arthritis    Atrial fibrillation (HCC)    with Graves disease    Blood transfusion without reported diagnosis    COVID-19 virus infection 08/15/2020   Depression 2012   Fatty liver 03/21/2011   GERD (gastroesophageal reflux disease)    Grave's disease 2002   s/p thyroidectomy   Heart murmur    MVP   History of chicken pox    HSV-2 (herpes simplex virus 2) infection    Hypertension     Past Surgical History:  Procedure Laterality Date   CESAREAN SECTION  2001   CESAREAN SECTION  2003   CESAREAN SECTION  2007   COLONOSCOPY  2010   [facility]    THYROIDECTOMY  2003   for graves disease   TUBAL LIGATION      Current Medications: Current Meds  Medication Sig   amphetamine-dextroamphetamine (ADDERALL XR) 10 MG 24 hr capsule Take 1 capsule by mouth in the  morning.   amphetamine-dextroamphetamine (ADDERALL XR) 20 MG 24 hr capsule Take 1 capsule (20 mg total) by mouth in the morning.   amphetamine-dextroamphetamine (ADDERALL XR) 30 MG 24 hr capsule Take 1 capsule (30 mg total) by mouth in the morning.   buPROPion (WELLBUTRIN XL) 150 MG 24 hr tablet Take 1 tablet (150 mg total) by mouth daily.   escitalopram (LEXAPRO) 20 MG tablet Take 1 tablet (20 mg total) by mouth daily.   levothyroxine (SYNTHROID) 137 MCG tablet Take 1 tablet (137 mcg total) by mouth daily before breakfast.   metoprolol tartrate (LOPRESSOR) 25 MG tablet Take 0.5 tablets (12.5 mg total) by mouth 2 (two) times daily.   Multiple Vitamins-Minerals (MULTIVITAMIN WITH MINERALS) tablet Take 1 tablet by mouth daily.   valACYclovir (VALTREX) 1000 MG tablet Take 1 tablet (1,000 mg total) by mouth daily as needed for 5 days for outbreak. (Patient taking differently: Take 1,000 mg by mouth daily as needed (Out break).)     Allergies:   Patient has no known allergies.   Social History   Socioeconomic History   Marital status: Married    Spouse name: Not on file   Number of children: 3   Years of education: Not on file   Highest education level: Not on file  Occupational History    Employer: [employer]  Tobacco Use   Smoking status: Never  Passive exposure: Past   Smokeless tobacco: Never  Vaping Use   Vaping status: Never Used  Substance and Sexual Activity   Alcohol use: Yes    Alcohol/week: 3.0 standard drinks of alcohol    Types: 3 Glasses of wine per week   Drug use: No   Sexual activity: Yes    Birth control/protection: Surgical    Comment: btl  Other Topics Concern   Not on file  Social History Narrative   Regular exercise:  No Caffeine Use: 2 drinks weekly   Right handed    Social Drivers of Corporate investment banker Strain: Not on file  Food Insecurity: Not on file  Transportation Needs: Not on file  Physical Activity: Not on file  Stress: Not on file   Social Connections: Not on file     Family History: The patient's family history includes ADD / ADHD in her brother and brother; Alcohol abuse in her father and mother; Bipolar disorder in her mother; CVA in her father; Cancer in her maternal grandmother and paternal grandmother; Colon cancer in her maternal grandmother; Colon polyps in her father and maternal grandmother; Depression in her maternal grandmother, mother, and sister; Diabetes in her father, maternal grandmother, and paternal grandmother; Environmental Allergies in her daughter, daughter, and son; Hypertension in her father, maternal grandmother, paternal grandmother, and sister; Memory loss in her father. There is no history of Esophageal cancer, Rectal cancer, or Stomach cancer. ROS:   Please see the history of present illness.    All 14 point review of systems negative except as described per history of present illness  EKGs/Labs/Other Studies Reviewed:         Recent Labs: 11/26/2022: ALT 13; BUN 13; Creat 0.76; Hemoglobin 11.3; Platelets 441; Potassium 4.6; Sodium 140; TSH 1.89  Recent Lipid Panel    Component Value Date/Time   CHOL 127 04/19/2022 1214   TRIG 49.0 04/19/2022 1214   HDL 53.30 04/19/2022 1214   CHOLHDL 2 04/19/2022 1214   VLDL 9.8 04/19/2022 1214   LDLCALC 64 04/19/2022 1214   LDLCALC 85 09/01/2018 1501    Physical Exam:    VS:  BP 102/72 (BP Location: Left Arm, Patient Position: Sitting)   Pulse 90   Ht 5\' 5"  (1.651 m)   Wt 184 lb (83.5 kg)   SpO2 97%   BMI 30.62 kg/m     Wt Readings from Last 3 Encounters:  02/11/23 184 lb (83.5 kg)  12/13/22 183 lb (83 kg)  11/26/22 180 lb (81.6 kg)     GEN:  Well nourished, well developed in no acute distress HEENT: Normal NECK: No JVD; No carotid bruits LYMPHATICS: No lymphadenopathy CARDIAC: RRR, no murmurs, no rubs, no gallops RESPIRATORY:  Clear to auscultation without rales, wheezing or rhonchi  ABDOMEN: Soft, non-tender,  non-distended MUSCULOSKELETAL:  No edema; No deformity  SKIN: Warm and dry LOWER EXTREMITIES: no swelling NEUROLOGIC:  Alert and oriented x 3 PSYCHIATRIC:  Normal affect   ASSESSMENT:    1. Essential hypertension   2. Mitral valve prolapse   3. Hypothyroidism, unspecified type   4. Tachycardia    PLAN:    In order of problems listed above:  Sinus tachycardia I have no good explanation fluid small dose of beta-blocker has been utilized.  I switch her to long-acting form to simply easier for her to take. History of mitral valve prolapse echocardiogram did not confirm that continue monitoring. Hypothyroidism she is taking appropriate medication which continue. Attention deficit disorder  she was taking Adderall however that being discontinue which helps also to control her rate better. Cholesterol status I did review K PN which show me LDL 64 HDL 53 good cholesterol continue present management   Medication Adjustments/Labs and Tests Ordered: Current medicines are reviewed at length with the patient today.  Concerns regarding medicines are outlined above.  No orders of the defined types were placed in this encounter.  Medication changes: No orders of the defined types were placed in this encounter.   Signed, Georgeanna Lea, MD, [facility] 02/11/2023 8:45 AM    [facility]

## 2023-02-11 NOTE — Addendum Note (Signed)
Addended by: Baldo Ash D on: 02/11/2023 08:56 AM   Modules accepted: Orders

## 2023-03-02 ENCOUNTER — Other Ambulatory Visit: Payer: Self-pay | Admitting: Family

## 2023-03-02 ENCOUNTER — Other Ambulatory Visit (HOSPITAL_BASED_OUTPATIENT_CLINIC_OR_DEPARTMENT_OTHER): Payer: Self-pay

## 2023-03-02 MED ORDER — MULTI-VITAMIN/MINERALS PO TABS
1.0000 | ORAL_TABLET | Freq: Every day | ORAL | 2 refills | Status: AC
  Filled 2023-03-02: qty 130, 130d supply, fill #0

## 2023-04-20 ENCOUNTER — Ambulatory Visit (INDEPENDENT_AMBULATORY_CARE_PROVIDER_SITE_OTHER): Admitting: Family

## 2023-04-20 ENCOUNTER — Other Ambulatory Visit (HOSPITAL_BASED_OUTPATIENT_CLINIC_OR_DEPARTMENT_OTHER): Payer: Self-pay

## 2023-04-20 ENCOUNTER — Other Ambulatory Visit (HOSPITAL_COMMUNITY)
Admission: RE | Admit: 2023-04-20 | Discharge: 2023-04-20 | Disposition: A | Discharge status: Home / Self Care | Source: Ambulatory Visit | Attending: Family | Admitting: Family

## 2023-04-20 VITALS — BP 122/75 | HR 70 | Temp 97.8°F | Resp 16 | Ht 65.0 in | Wt 184.0 lb

## 2023-04-20 DIAGNOSIS — S6992XA Unspecified injury of left wrist, hand and finger(s), initial encounter: Secondary | ICD-10-CM

## 2023-04-20 DIAGNOSIS — Z23 Encounter for immunization: Secondary | ICD-10-CM

## 2023-04-20 DIAGNOSIS — Z01419 Encounter for gynecological examination (general) (routine) without abnormal findings: Secondary | ICD-10-CM | POA: Insufficient documentation

## 2023-04-20 DIAGNOSIS — Z113 Encounter for screening for infections with a predominantly sexual mode of transmission: Secondary | ICD-10-CM

## 2023-04-20 DIAGNOSIS — E039 Hypothyroidism, unspecified: Secondary | ICD-10-CM | POA: Diagnosis not present

## 2023-04-20 DIAGNOSIS — R41840 Attention and concentration deficit: Secondary | ICD-10-CM

## 2023-04-20 DIAGNOSIS — Z0000 Encounter for general adult medical examination without abnormal findings: Secondary | ICD-10-CM

## 2023-04-20 DIAGNOSIS — D500 Iron deficiency anemia secondary to blood loss (chronic): Secondary | ICD-10-CM | POA: Diagnosis not present

## 2023-04-20 DIAGNOSIS — R739 Hyperglycemia, unspecified: Secondary | ICD-10-CM

## 2023-04-20 DIAGNOSIS — F418 Other specified anxiety disorders: Secondary | ICD-10-CM | POA: Diagnosis not present

## 2023-04-20 DIAGNOSIS — B009 Herpesviral infection, unspecified: Secondary | ICD-10-CM | POA: Diagnosis not present

## 2023-04-20 DIAGNOSIS — I10 Essential (primary) hypertension: Secondary | ICD-10-CM | POA: Diagnosis not present

## 2023-04-20 DIAGNOSIS — N920 Excessive and frequent menstruation with regular cycle: Secondary | ICD-10-CM

## 2023-04-20 MED ORDER — BUPROPION HCL ER (XL) 300 MG PO TB24
300.0000 mg | ORAL_TABLET | Freq: Every day | ORAL | 0 refills | Status: DC
  Filled 2023-04-20: qty 90, 90d supply, fill #0

## 2023-04-20 NOTE — Progress Notes (Signed)
 Subjective:     Patient ID: Cheyenne Hawkins, female    DOB: [birth year], 48 y.o.   MRN: [id number]  Chief Complaint  Patient presents with   Annual Exam    HPI  Discussed the use of AI scribe software for clinical note transcription with the patient, who gave verbal consent to proceed.  History of Present Illness  Cheyenne Hawkins is a 48 year old female who presents for an annual physical exam.  She discontinued Adderall due to experiencing tachycardia and palpitations. A cardiologist confirmed no structural heart damage. She was taking Adderall daily but has stopped altogether. Currently, she is taking Wellbutrin and is not taking Lexapro. She stopped Lexapro after losing her medication and did not experience withdrawal symptoms.  Her mood is described as 'okay' but she notes increased tearfulness, which she attributes to perimenopause and personal stressors, including separation from her partner. She is considering increasing her Wellbutrin dosage to help with mood and stress eating.  She describes her menstrual cycle as irregular and heavy, with prolonged spotting followed by heavy bleeding and clotting. She experiences menstrual cramping similar to when she first started menstruating.  She reports a thumb injury from a skiing accident on December 22, 2022, resulting in persistent pain and difficulty grasping objects. The pain is exacerbated by repetitive work activities.  She uses Valtrex as needed for herpes outbreaks, typically around her menstrual cycle, but does not experience frequent outbreaks.  She feels overwhelmed with family responsibilities, including her daughter's return home with a baby and another daughter's upcoming college graduation. She wants peace and acknowledges suppressing emotions to cope with stress.  She maintains a weight of 180-185 pounds and acknowledges the need to incorporate more vegetables into her diet. Her exercise routine is inconsistent. She  takes B12 vitamins occasionally when feeling low energy and ashwagandha for stress management. Wt Readings from Last 3 Encounters:  04/20/23 184 lb (83.5 kg)  02/11/23 184 lb (83.5 kg)  12/13/22 183 lb (83 kg)     Lab Results  Component Value Date   HGBA1C 5.9 05/18/2019      Health Maintenance Due  Topic Date Due   Pneumococcal Vaccine 12-60 Years old (1 of 2 - PCV) Never done   COVID-19 Vaccine (3 - Pfizer risk series) 03/26/2019    Past Medical History:  Diagnosis Date   Anemia, iron deficiency 11/19/2011   Arthritis    Atrial fibrillation (HCC)    with Graves disease    Blood transfusion without reported diagnosis    COVID-19 virus infection 08/15/2020   Depression 2012   Fatty liver 03/21/2011   GERD (gastroesophageal reflux disease)    Grave's disease 2002   s/p thyroidectomy   Heart murmur    MVP   History of chicken pox    HSV-2 (herpes simplex virus 2) infection    Hypertension     Past Surgical History:  Procedure Laterality Date   CESAREAN SECTION  2001   CESAREAN SECTION  2003   CESAREAN SECTION  2007   COLONOSCOPY  2010   [facility]    THYROIDECTOMY  2003   for graves disease   TUBAL LIGATION      Family History  Problem Relation Age of Onset   Alcohol abuse Mother    Depression Mother    Bipolar disorder Mother    Alcohol abuse Father    Diabetes Father    Hypertension Father    Colon polyps Father    CVA Father  Memory loss Father    Hypertension Sister    Depression Sister    ADD / ADHD Brother    ADD / ADHD Brother    Cancer Maternal Grandmother        colon   Depression Maternal Grandmother    Diabetes Maternal Grandmother    Hypertension Maternal Grandmother    Colon cancer Maternal Grandmother    Colon polyps Maternal Grandmother    Cancer Paternal Grandmother        breast   Diabetes Paternal Grandmother    Hypertension Paternal Industrial/product designer Allergies Daughter    Environmental Allergies Daughter     Environmental Allergies Son    Esophageal cancer Neg Hx    Rectal cancer Neg Hx    Stomach cancer Neg Hx     Social History   Socioeconomic History   Marital status: Married    Spouse name: Not on file   Number of children: 3   Years of education: Not on file   Highest education level: Master's degree (e.g., MA, MS, MEng, MEd, MSW, MBA)  Occupational History    Employer: [employer]  Tobacco Use   Smoking status: Never    Passive exposure: Past   Smokeless tobacco: Never  Vaping Use   Vaping status: Never Used  Substance and Sexual Activity   Alcohol use: Yes    Alcohol/week: 3.0 standard drinks of alcohol    Types: 3 Glasses of wine per week   Drug use: No   Sexual activity: Yes    Birth control/protection: Surgical    Comment: btl  Other Topics Concern   Not on file  Social History Narrative   Regular exercise:  No Caffeine Use: 2 drinks weekly   Right handed    Social Drivers of Health   Financial Resource Strain: Low Risk  (04/20/2023)   Overall Financial Resource Strain (CARDIA)    Difficulty of Paying Living Expenses: Not hard at all  Food Insecurity: No Food Insecurity (04/20/2023)   Hunger Vital Sign    Worried About Running Out of Food in the Last Year: Never true    Ran Out of Food in the Last Year: Never true  Transportation Needs: No Transportation Needs (04/20/2023)   PRAPARE - Administrator, Civil Service (Medical): No    Lack of Transportation (Non-Medical): No  Physical Activity: Insufficiently Active (04/20/2023)   Exercise Vital Sign    Days of Exercise per Week: 2 days    Minutes of Exercise per Session: 20 min  Stress: Stress Concern Present (04/20/2023)   [employer] of Occupational Health - Occupational Stress Questionnaire    Feeling of Stress : Rather much  Social Connections: Moderately Integrated (04/20/2023)   Social Connection and Isolation Panel [NHANES]    Frequency of Communication with Friends and Family: Twice a week     Frequency of Social Gatherings with Friends and Family: Once a week    Attends Religious Services: 1 to 4 times per year    Active Member of [employer] or Organizations: Yes    Attends Engineer, structural: More than 4 times per year    Marital Status: Separated  Intimate Partner Violence: Not on file    Outpatient Medications Prior to Visit  Medication Sig Dispense Refill   levothyroxine (SYNTHROID) 137 MCG tablet Take 1 tablet (137 mcg total) by mouth daily before breakfast. 90 tablet 0   metoprolol succinate (TOPROL XL) 25 MG 24 hr tablet Take  1 tablet (25 mg total) by mouth daily. 90 tablet 3   Multiple Vitamins-Minerals (MULTIVITAMIN WITH MINERALS) tablet Take 1 tablet by mouth daily. 130 tablet 2   valACYclovir (VALTREX) 1000 MG tablet Take 1 tablet (1,000 mg total) by mouth daily as needed for 5 days for outbreak. (Patient taking differently: Take 1,000 mg by mouth daily as needed (Out break).) 20 tablet 5   buPROPion (WELLBUTRIN XL) 150 MG 24 hr tablet Take 1 tablet (150 mg total) by mouth daily. 90 tablet 1   amphetamine-dextroamphetamine (ADDERALL XR) 10 MG 24 hr capsule Take 1 capsule by mouth in the morning. 30 capsule 0   amphetamine-dextroamphetamine (ADDERALL XR) 20 MG 24 hr capsule Take 1 capsule (20 mg total) by mouth in the morning. 30 capsule 0   amphetamine-dextroamphetamine (ADDERALL XR) 30 MG 24 hr capsule Take 1 capsule (30 mg total) by mouth in the morning. 30 capsule 0   escitalopram (LEXAPRO) 20 MG tablet Take 1 tablet (20 mg total) by mouth daily. 90 tablet 1   No facility-administered medications prior to visit.    No Known Allergies  ROS See HPI    Objective:    Physical Exam   BP 122/75 (BP Location: Right Arm, Patient Position: Sitting, Cuff Size: Normal)   Pulse 70   Temp 97.8 F (36.6 C) (Oral)   Resp 16   Ht 5\' 5"  (1.651 m)   Wt 184 lb (83.5 kg)   SpO2 100%   BMI 30.62 kg/m  Wt Readings from Last 3 Encounters:  04/20/23 184 lb (83.5  kg)  02/11/23 184 lb (83.5 kg)  12/13/22 183 lb (83 kg)  Physical Exam  Constitutional: She is oriented to person, place, and time. She appears well-developed and well-nourished. No distress.  HENT:  Head: Normocephalic and atraumatic.  Right Ear: Tympanic membrane and ear canal normal.  Left Ear: Tympanic membrane and ear canal normal.  Mouth/Throat: Oropharynx is clear and moist.  Eyes: Pupils are equal, round, and reactive to light. No scleral icterus.  Neck: Normal range of motion. No thyromegaly present.  Cardiovascular: Normal rate and regular rhythm.   No murmur heard. Pulmonary/Chest: Effort normal and breath sounds normal. No respiratory distress. He has no wheezes. She has no rales. She exhibits no tenderness.  Abdominal: Soft. Bowel sounds are normal. She exhibits no distension and no mass. There is no tenderness. There is no rebound and no guarding.  Musculoskeletal: She exhibits no edema.  Lymphadenopathy:    She has no cervical adenopathy.  Neurological: She is alert and oriented to person, place, and time. She has normal patellar reflexes. She exhibits normal muscle tone. Coordination normal.  Skin: Skin is warm and dry.  Psychiatric: She has a normal mood and affect. Her behavior is normal. Judgment and thought content normal.  Breasts: deferred Inguinal/mons: Normal without inguinal adenopathy  External genitalia: Normal  BUS/Urethra/Skene's glands: Normal  Bladder: Normal  Vagina: Normal  Cervix: very high/anterior limiting OS access for pap Uterus: normal in size, shape and contour. Midline and mobile  Adnexa/parametria:  Rt: Without masses or tenderness.  Lt: Without masses or tenderness.  Anus and perineum: Normal            Assessment & Plan:    Assessment & Plan:   Problem List Items Addressed This Visit       Unprioritized   Anemia, iron deficiency (Chronic)   Relevant Orders   IBC + Ferritin   CBC w/Diff   Preventative health care -  Primary    Due for tetanus booster. Discussed healthy diet and exercise. Reviewed recent screenings and labs, noting anemia tendency and need for updated A1c. Discussed Pap smear and STD screening due to recent sexual activity. - Administer TD vaccine. - Perform Pap smear and STD screening. - Order labs including A1c and iron level. - Encourage healthy diet and exercise. - Check back in three months.      Menorrhagia with regular cycle   Discussed possible GYN referral- she declines for now. Will monitor.      Injury of left thumb    Persistent pain and difficulty grasping suggest possible fracture or ligament injury. X-ray to assess for fracture. Referral to hand specialist if no fracture is found and symptoms persist. - Order X-ray of the thumb. - Consider referral to hand specialist if no fracture is found and symptoms persist.       Relevant Orders   DG Finger Thumb Left   Hypothyroidism   Lab Results  Component Value Date   TSH 1.89 11/26/2022   Clinically stable on synthroid.  Update TSH.       Relevant Orders   TSH   HSV-2 (herpes simplex virus 2) infection    Uses Valtrex as needed for outbreaks. Discussed suppressive therapy options to reduce outbreak frequency and transmission risk. - Continue Valtrex as needed. - Consider daily suppressive therapy if outbreaks become more frequent or for future partner protection.      Essential hypertension   BP Readings from Last 3 Encounters:  04/20/23 122/75  02/11/23 102/72  12/13/22 120/66   BP stable on metoprolol       Depression with anxiety   No longer taking lexapro.   Currently on Wellbutrin  XL 150mg  with increased tearfulness, possibly due to perimenopausal changes and life stressors. Discussed increasing Wellbutrin to 300 mg XL to help with mood and stress eating. - Increase Wellbutrin to 300 mg daily.      Relevant Medications   buPROPion (WELLBUTRIN XL) 300 MG 24 hr tablet   Attention deficit    Reports that she had tachycardia with adderall so she discontinued.        Other Visit Diagnoses       Hyperglycemia       Relevant Orders   HgB A1c   Comp Met (CMET)     Routine screening for STI (sexually transmitted infection)       Relevant Orders   Cervicovaginal ancillary only( [facility])     Encounter for routine gynecological examination with Papanicolaou smear of cervix       Relevant Orders   Cytology - PAP( [facility])     Need for prophylactic vaccination with tetanus-diphtheria (Td)       Relevant Orders   Td : Tetanus/diphtheria >7yo Preservative  free (Completed)       I have discontinued Camillia Herter. [place]ra's escitalopram, amphetamine-dextroamphetamine, amphetamine-dextroamphetamine, amphetamine-dextroamphetamine, and buPROPion. I am also having her start on buPROPion. Additionally, I am having her maintain her valACYclovir, levothyroxine, metoprolol succinate, and multivitamin with minerals.  Meds ordered this encounter  Medications   buPROPion (WELLBUTRIN XL) 300 MG 24 hr tablet    Sig: Take 1 tablet (300 mg total) by mouth daily.    Dispense:  90 tablet    Refill:  0    Supervising Provider:   Danise Edge A [[id number]]

## 2023-04-20 NOTE — Assessment & Plan Note (Signed)
  Persistent pain and difficulty grasping suggest possible fracture or ligament injury. X-ray to assess for fracture. Referral to hand specialist if no fracture is found and symptoms persist. - Order X-ray of the thumb. - Consider referral to hand specialist if no fracture is found and symptoms persist.

## 2023-04-20 NOTE — Assessment & Plan Note (Addendum)
  Uses Valtrex as needed for outbreaks. Discussed suppressive therapy options to reduce outbreak frequency and transmission risk. - Continue Valtrex as needed. - Consider daily suppressive therapy if outbreaks become more frequent or for future partner protection.

## 2023-04-20 NOTE — Assessment & Plan Note (Signed)
 Reports that she had tachycardia with adderall so she discontinued.

## 2023-04-20 NOTE — Assessment & Plan Note (Addendum)
 No longer taking lexapro.   Currently on Wellbutrin  XL 150mg  with increased tearfulness, possibly due to perimenopausal changes and life stressors. Discussed increasing Wellbutrin to 300 mg XL to help with mood and stress eating. - Increase Wellbutrin to 300 mg daily.

## 2023-04-20 NOTE — Assessment & Plan Note (Signed)
 BP Readings from Last 3 Encounters:  04/20/23 122/75  02/11/23 102/72  12/13/22 120/66   BP stable on metoprolol

## 2023-04-20 NOTE — Assessment & Plan Note (Signed)
  Due for tetanus booster. Discussed healthy diet and exercise. Reviewed recent screenings and labs, noting anemia tendency and need for updated A1c. Discussed Pap smear and STD screening due to recent sexual activity. - Administer TD vaccine. - Perform Pap smear and STD screening. - Order labs including A1c and iron level. - Encourage healthy diet and exercise. - Check back in three months.

## 2023-04-20 NOTE — Assessment & Plan Note (Signed)
 Discussed possible GYN referral- she declines for now. Will monitor.

## 2023-04-20 NOTE — Patient Instructions (Signed)
 VISIT SUMMARY:  Today, you came in for your annual physical exam. We discussed several health concerns, including your thumb injury, menstrual issues, mood changes, and general health maintenance. We also reviewed your current medications and made some adjustments to better manage your symptoms.  YOUR PLAN:  -THUMB INJURY: You have persistent pain and difficulty grasping objects, which may indicate a fracture or ligament injury. We will order an X-ray to check for a fracture. If no fracture is found and your symptoms continue, we may refer you to a hand specialist.  -MENORRHAGIA: You are experiencing heavy menstrual bleeding, which is likely due to perimenopausal changes. We may refer you to a gynecologist for further evaluation and management options.  -DEPRESSION: You are currently taking Wellbutrin and experiencing increased tearfulness, possibly due to perimenopausal changes and life stressors. We will increase your Wellbutrin dosage to 300 mg daily to help with your mood and stress eating. Please monitor your mood and any side effects, and report back in a few weeks.  -ATTENTION-DEFICIT/HYPERACTIVITY DISORDER (ADHD): You previously experienced heart-related side effects from Adderall and are now managing without it. Your cardiologist confirmed no structural heart damage.  -HERPES SIMPLEX VIRUS (HSV): You use Valtrex as needed for herpes outbreaks. We discussed the option of daily suppressive therapy to reduce the frequency of outbreaks and the risk of transmission to future partners.  -GENERAL HEALTH MAINTENANCE: You are due for a tetanus booster, and we discussed the importance of a healthy diet and regular exercise. We reviewed your recent screenings and labs, noting a tendency towards anemia and the need for an updated A1c test. We also discussed the need for a Pap smear and STD screening due to recent sexual activity.  INSTRUCTIONS:  1. Get an X-ray of your thumb. 2. If no fracture is  found and symptoms persist, consider seeing a hand specialist. 3. Increase your Wellbutrin dosage to 300 mg daily and monitor your mood and side effects. Report back in a few weeks. 4. Continue using Valtrex as needed. Consider daily suppressive therapy if outbreaks become more frequent. 5. Get a tetanus booster shot today. 6. Schedule a Pap smear and STD screening. 7. Get labs done, including A1c and iron levels. 8. Focus on maintaining a healthy diet and regular exercise. 9. Follow up in three months.

## 2023-04-20 NOTE — Assessment & Plan Note (Addendum)
 Lab Results  Component Value Date   TSH 1.89 11/26/2022   Clinically stable on synthroid.  Update TSH.

## 2023-04-21 ENCOUNTER — Other Ambulatory Visit (HOSPITAL_BASED_OUTPATIENT_CLINIC_OR_DEPARTMENT_OTHER): Payer: Self-pay

## 2023-04-21 ENCOUNTER — Other Ambulatory Visit: Payer: Self-pay | Admitting: Family

## 2023-04-21 LAB — CBC WITH DIFFERENTIAL/PLATELET
Basophils Absolute: 0.1 10*3/uL (ref 0.0–0.1)
Basophils Relative: 0.9 % (ref 0.0–3.0)
Eosinophils Absolute: 0.1 10*3/uL (ref 0.0–0.7)
Eosinophils Relative: 1.1 % (ref 0.0–5.0)
HCT: 36.8 % (ref 36.0–46.0)
Hemoglobin: 11.6 g/dL — ABNORMAL LOW (ref 12.0–15.0)
Lymphocytes Relative: 27.5 % (ref 12.0–46.0)
Lymphs Abs: 1.7 10*3/uL (ref 0.7–4.0)
MCHC: 31.6 g/dL (ref 30.0–36.0)
MCV: 85.2 fl (ref 78.0–100.0)
Monocytes Absolute: 0.5 10*3/uL (ref 0.1–1.0)
Monocytes Relative: 9 % (ref 3.0–12.0)
Neutro Abs: 3.8 10*3/uL (ref 1.4–7.7)
Neutrophils Relative %: 61.5 % (ref 43.0–77.0)
Platelets: 412 10*3/uL — ABNORMAL HIGH (ref 150.0–400.0)
RBC: 4.32 Mil/uL (ref 3.87–5.11)
RDW: 16.8 % — ABNORMAL HIGH (ref 11.5–15.5)
WBC: 6.1 10*3/uL (ref 4.0–10.5)

## 2023-04-21 LAB — COMPREHENSIVE METABOLIC PANEL WITH GFR
ALT: 14 U/L (ref 0–35)
AST: 18 U/L (ref 0–37)
Albumin: 4.3 g/dL (ref 3.5–5.2)
Alkaline Phosphatase: 75 U/L (ref 39–117)
BUN: 11 mg/dL (ref 6–23)
CO2: 28 meq/L (ref 19–32)
Calcium: 9.5 mg/dL (ref 8.4–10.5)
Chloride: 101 meq/L (ref 96–112)
Creatinine, Ser: 0.64 mg/dL (ref 0.40–1.20)
GFR: 103.91 mL/min (ref >60.00)
Glucose, Bld: 89 mg/dL (ref 70–99)
Potassium: 4.2 meq/L (ref 3.5–5.1)
Sodium: 136 meq/L (ref 135–145)
Total Bilirubin: 0.5 mg/dL (ref 0.2–1.2)
Total Protein: 7.5 g/dL (ref 6.0–8.3)

## 2023-04-21 LAB — IBC + FERRITIN
Ferritin: 5.6 ng/mL — ABNORMAL LOW (ref 10.0–291.0)
Iron: 67 ug/dL (ref 42–145)
Saturation Ratios: 13.9 % — ABNORMAL LOW (ref 20.0–50.0)
TIBC: 481.6 ug/dL — ABNORMAL HIGH (ref 250.0–450.0)
Transferrin: 344 mg/dL (ref 212.0–360.0)

## 2023-04-21 LAB — TSH: TSH: 1.17 u[IU]/mL (ref 0.35–5.50)

## 2023-04-21 LAB — HEMOGLOBIN A1C: Hgb A1c MFr Bld: 6.1 % (ref 4.6–6.5)

## 2023-04-21 MED ORDER — LEVOTHYROXINE SODIUM 137 MCG PO TABS
137.0000 ug | ORAL_TABLET | Freq: Every day | ORAL | 1 refills | Status: DC
  Filled 2023-04-21: qty 90, 90d supply, fill #0
  Filled 2023-08-16 – 2023-08-17 (×2): qty 90, 90d supply, fill #1

## 2023-04-25 ENCOUNTER — Telehealth: Payer: Self-pay | Admitting: Family

## 2023-04-25 MED ORDER — FERROUS SULFATE 325 (65 FE) MG PO TABS: 325.0000 mg | ORAL_TABLET | ORAL | Status: AC

## 2023-04-25 NOTE — Telephone Encounter (Signed)
 See mychart.

## 2023-04-26 ENCOUNTER — Encounter: Payer: Self-pay | Admitting: Family

## 2023-04-26 LAB — CYTOLOGY - PAP
Adequacy: ABSENT
Comment: NEGATIVE
Diagnosis: NEGATIVE
High risk HPV: NEGATIVE

## 2023-04-26 LAB — CERVICOVAGINAL ANCILLARY ONLY
Chlamydia: NEGATIVE
Comment: NEGATIVE
Comment: NEGATIVE
Comment: NORMAL
Neisseria Gonorrhea: NEGATIVE
Trichomonas: NEGATIVE

## 2023-05-22 ENCOUNTER — Encounter: Payer: Self-pay | Admitting: Family

## 2023-07-20 ENCOUNTER — Other Ambulatory Visit (HOSPITAL_BASED_OUTPATIENT_CLINIC_OR_DEPARTMENT_OTHER): Payer: Self-pay

## 2023-07-20 ENCOUNTER — Ambulatory Visit: Admitting: Family

## 2023-07-20 ENCOUNTER — Encounter: Payer: Self-pay | Admitting: Family

## 2023-07-20 VITALS — BP 100/78 | HR 84 | Temp 98.4°F | Resp 16 | Ht 65.0 in | Wt 182.0 lb

## 2023-07-20 DIAGNOSIS — F418 Other specified anxiety disorders: Secondary | ICD-10-CM | POA: Diagnosis not present

## 2023-07-20 DIAGNOSIS — M5416 Radiculopathy, lumbar region: Secondary | ICD-10-CM | POA: Insufficient documentation

## 2023-07-20 MED ORDER — METHYLPREDNISOLONE 4 MG PO TBPK
ORAL_TABLET | ORAL | 0 refills | Status: AC
  Filled 2023-07-20: qty 21, 6d supply, fill #0

## 2023-07-20 MED ORDER — BUPROPION HCL ER (XL) 300 MG PO TB24
300.0000 mg | ORAL_TABLET | Freq: Every day | ORAL | 1 refills | Status: AC
  Filled 2023-07-20: qty 90, 90d supply, fill #0
  Filled 2023-12-28: qty 90, 90d supply, fill #1

## 2023-07-20 NOTE — Progress Notes (Addendum)
 Subjective:     Patient ID: Cheyenne Hawkins, female    DOB: 04-Apr-1975, 48 y.o.   MRN: [id number]  Chief Complaint  Patient presents with   Hypothyroidism   Mood   Follow-up    HPI  Discussed the use of AI scribe software for clinical note transcription with the patient, who gave verbal consent to proceed.  History of Present Illness   Cheyenne Hawkins is a 48 year old female who presents with follow-up of depression and new onset low back pain.  Her mood has improved since her last visit in April, following an increase in Wellbutrin  from 150 mg to 300 mg. She continues to experience some tearfulness, which she attributes to situational stressors.  She has developed new onset low, left-sided low back pain with radiation down the left leg. An episode of severe pain occurred yesterday, causing her to stop abruptly, with pain radiating into her left heel.    Health Maintenance Due  Topic Date Due   Pneumococcal Vaccine 88-84 Years old (1 of 2 - PCV) Never done   COVID-19 Vaccine (3 - Pfizer risk series) 03/26/2019    Past Medical History:  Diagnosis Date   Anemia, iron  deficiency 11/19/2011   Arthritis    Atrial fibrillation (HCC)    with Graves disease    Blood transfusion without reported diagnosis    COVID-19 virus infection 08/15/2020   Depression 2012   Fatty liver 03/21/2011   GERD (gastroesophageal reflux disease)    Grave's disease 2002   s/p thyroidectomy   Heart murmur    MVP   History of chicken pox    HSV-2 (herpes simplex virus 2) infection    Hypertension     Past Surgical History:  Procedure Laterality Date   CESAREAN SECTION  2001   CESAREAN SECTION  2003   CESAREAN SECTION  2007   COLONOSCOPY  2010   [facility]    THYROIDECTOMY  2003   for graves disease   TUBAL LIGATION      Family History  Problem Relation Age of Onset   Alcohol abuse Mother    Depression Mother    Bipolar disorder Mother    Alcohol abuse Father    Diabetes Father     Hypertension Father    Colon polyps Father    CVA Father    Memory loss Father    Hypertension Sister    Depression Sister    ADD / ADHD Brother    ADD / ADHD Brother    Cancer Maternal Grandmother        colon   Depression Maternal Grandmother    Diabetes Maternal Grandmother    Hypertension Maternal Grandmother    Colon cancer Maternal Grandmother    Colon polyps Maternal Grandmother    Cancer Paternal Grandmother        breast   Diabetes Paternal Grandmother    Hypertension Paternal Industrial/product designer Allergies Daughter    Environmental Allergies Daughter    Environmental Allergies Son    Esophageal cancer Neg Hx    Rectal cancer Neg Hx    Stomach cancer Neg Hx     Social History   Socioeconomic History   Marital status: Married    Spouse name: Not on file   Number of children: 3   Years of education: Not on file   Highest education level: Master's degree (e.g., MA, MS, MEng, MEd, MSW, MBA)  Occupational History    Employer: [employer]  Tobacco Use   Smoking status: Never    Passive exposure: Past   Smokeless tobacco: Never  Vaping Use   Vaping status: Never Used  Substance and Sexual Activity   Alcohol use: Yes    Alcohol/week: 3.0 standard drinks of alcohol    Types: 3 Glasses of wine per week   Drug use: No   Sexual activity: Yes    Birth control/protection: Surgical    Comment: btl  Other Topics Concern   Not on file  Social History Narrative   Regular exercise:  No Caffeine Use: 2 drinks weekly   Right handed    Social Drivers of Health   Financial Resource Strain: Low Risk  (04/20/2023)   Overall Financial Resource Strain (CARDIA)    Difficulty of Paying Living Expenses: Not hard at all  Food Insecurity: No Food Insecurity (04/20/2023)   Hunger Vital Sign    Worried About Running Out of Food in the Last Year: Never true    Ran Out of Food in the Last Year: Never true  Transportation Needs: No Transportation Needs (04/20/2023)    PRAPARE - Administrator, Civil Service (Medical): No    Lack of Transportation (Non-Medical): No  Physical Activity: Insufficiently Active (04/20/2023)   Exercise Vital Sign    Days of Exercise per Week: 2 days    Minutes of Exercise per Session: 20 min  Stress: Stress Concern Present (04/20/2023)   [employer] of Occupational Health - Occupational Stress Questionnaire    Feeling of Stress : Rather much  Social Connections: Moderately Integrated (04/20/2023)   Social Connection and Isolation Panel    Frequency of Communication with Friends and Family: Twice a week    Frequency of Social Gatherings with Friends and Family: Once a week    Attends Religious Services: 1 to 4 times per year    Active Member of [employer] or Organizations: Yes    Attends Engineer, structural: More than 4 times per year    Marital Status: Separated  Intimate Partner Violence: Not on file    Outpatient Medications Prior to Visit  Medication Sig Dispense Refill   ferrous sulfate  325 (65 FE) MG tablet Take 1 tablet (325 mg total) by mouth every other day.     levothyroxine  (SYNTHROID ) 137 MCG tablet Take 1 tablet (137 mcg total) by mouth daily before breakfast. 90 tablet 1   metoprolol  succinate (TOPROL  XL) 25 MG 24 hr tablet Take 1 tablet (25 mg total) by mouth daily. 90 tablet 3   Multiple Vitamins-Minerals (MULTIVITAMIN WITH MINERALS) tablet Take 1 tablet by mouth daily. 130 tablet 2   valACYclovir  (VALTREX ) 1000 MG tablet Take 1 tablet (1,000 mg total) by mouth daily as needed for 5 days for outbreak. (Patient taking differently: Take 1,000 mg by mouth daily as needed (Out break).) 20 tablet 5   buPROPion  (WELLBUTRIN  XL) 300 MG 24 hr tablet Take 1 tablet (300 mg total) by mouth daily. 90 tablet 0   No facility-administered medications prior to visit.    No Known Allergies  ROS    See HPI Objective:    Physical Exam Constitutional:      General: She is not in acute distress.     Appearance: Normal appearance. She is well-developed.  HENT:     Head: Normocephalic and atraumatic.     Right Ear: External ear normal.     Left Ear: External ear normal.  Eyes:     General: No  scleral icterus. Neck:     Thyroid : No thyromegaly.  Cardiovascular:     Rate and Rhythm: Normal rate and regular rhythm.     Heart sounds: Normal heart sounds. No murmur heard. Pulmonary:     Effort: Pulmonary effort is normal. No respiratory distress.     Breath sounds: Normal breath sounds. No wheezing.  Musculoskeletal:     Cervical back: Neck supple.  Skin:    General: Skin is warm and dry.  Neurological:     Mental Status: She is alert and oriented to person, place, and time.     Deep Tendon Reflexes:     Reflex Scores:      Patellar reflexes are 2+ on the right side and 2+ on the left side.    Comments: Bilateral LE strength is 5/5  Psychiatric:        Mood and Affect: Mood normal.        Behavior: Behavior normal.        Thought Content: Thought content normal.        Judgment: Judgment normal.      BP 100/78 (BP Location: Left Arm, Patient Position: Sitting, Cuff Size: Normal)   Pulse 84   Temp 98.4 F (36.9 C) (Oral)   Resp 16   Ht 5' 5 (1.651 m)   Wt 182 lb (82.6 kg)   SpO2 99%   BMI 30.29 kg/m  Wt Readings from Last 3 Encounters:  07/20/23 182 lb (82.6 kg)  04/20/23 184 lb (83.5 kg)  02/11/23 184 lb (83.5 kg)       Assessment & Plan:   Problem List Items Addressed This Visit       Unprioritized   Lumbar radiculopathy - Primary    New onset left-sided low back pain with radicular symptoms to the left heel, suggesting nerve compression or irritation. - Initiated Medrol  Dosepak. - Advised to report if symptoms worsen or do not improve.       Relevant Medications   buPROPion  (WELLBUTRIN  XL) 300 MG 24 hr tablet   methylPREDNISolone  (MEDROL  DOSEPAK) 4 MG TBPK tablet   Depression with anxiety    Depression improved with increased Wellbutrin  dosage,  though some tearfulness persists due to situational stressors. - Continue Wellbutrin . - Monitor mood and tearfulness.      Relevant Medications   buPROPion  (WELLBUTRIN  XL) 300 MG 24 hr tablet    I am having Sherleen G. Tetzloff start on methylPREDNISolone . I am also having her maintain her valACYclovir , metoprolol  succinate, multivitamin with minerals, levothyroxine , ferrous sulfate , and buPROPion .  Meds ordered this encounter  Medications   buPROPion  (WELLBUTRIN  XL) 300 MG 24 hr tablet    Sig: Take 1 tablet (300 mg total) by mouth daily.    Dispense:  90 tablet    Refill:  1   methylPREDNISolone  (MEDROL  DOSEPAK) 4 MG TBPK tablet    Sig: Take per package instructions    Dispense:  21 tablet    Refill:  0    Supervising Provider:   DOMENICA BLACKBIRD A [[id number]]

## 2023-07-20 NOTE — Assessment & Plan Note (Signed)
  New onset left-sided low back pain with radicular symptoms to the left heel, suggesting nerve compression or irritation. - Initiated Medrol  Dosepak. - Advised to report if symptoms worsen or do not improve.

## 2023-07-20 NOTE — Assessment & Plan Note (Addendum)
  Depression improved with increased Wellbutrin  dosage, though some tearfulness persists due to situational stressors. - Continue Wellbutrin . - Monitor mood and tearfulness.

## 2023-07-20 NOTE — Patient Instructions (Signed)
 VISIT SUMMARY:  Today, we discussed your improved mood following the increase in Toprolixal and addressed your new onset low back pain with symptoms suggesting nerve compression.  YOUR PLAN:  LOW BACK PAIN WITH RADICULOPATHY: You have new left-sided low back pain that radiates down your left leg to your heel, which may be due to nerve compression or irritation. -Start taking Medrol  Dosepak as prescribed. -Please let us  know if your symptoms get worse or do not improve.  DEPRESSION: Your mood has improved with the increased dosage of Wellbutrin  XL, but you still experience some tearfulness due to situational stressors. -Continue taking Wellbutrin  as prescribed. -Monitor your mood and tearfulness, and report any changes.

## 2023-08-17 ENCOUNTER — Other Ambulatory Visit (HOSPITAL_BASED_OUTPATIENT_CLINIC_OR_DEPARTMENT_OTHER): Payer: Self-pay

## 2023-08-17 ENCOUNTER — Other Ambulatory Visit: Payer: Self-pay

## 2023-09-01 DIAGNOSIS — F4323 Adjustment disorder with mixed anxiety and depressed mood: Secondary | ICD-10-CM | POA: Diagnosis not present

## 2023-09-14 DIAGNOSIS — F4323 Adjustment disorder with mixed anxiety and depressed mood: Secondary | ICD-10-CM | POA: Diagnosis not present

## 2023-09-19 ENCOUNTER — Encounter (INDEPENDENT_AMBULATORY_CARE_PROVIDER_SITE_OTHER): Payer: Self-pay | Admitting: Family

## 2023-09-19 DIAGNOSIS — F418 Other specified anxiety disorders: Secondary | ICD-10-CM

## 2023-09-20 ENCOUNTER — Other Ambulatory Visit (HOSPITAL_BASED_OUTPATIENT_CLINIC_OR_DEPARTMENT_OTHER): Payer: Self-pay

## 2023-09-20 DIAGNOSIS — F418 Other specified anxiety disorders: Secondary | ICD-10-CM | POA: Diagnosis not present

## 2023-09-20 MED ORDER — SERTRALINE HCL 50 MG PO TABS
50.0000 mg | ORAL_TABLET | Freq: Every day | ORAL | 0 refills | Status: DC
  Filled 2023-09-20 – 2023-10-31 (×3): qty 30, 30d supply, fill #0

## 2023-09-20 NOTE — Telephone Encounter (Signed)

## 2023-09-28 DIAGNOSIS — F4323 Adjustment disorder with mixed anxiety and depressed mood: Secondary | ICD-10-CM | POA: Diagnosis not present

## 2023-09-30 ENCOUNTER — Other Ambulatory Visit (HOSPITAL_BASED_OUTPATIENT_CLINIC_OR_DEPARTMENT_OTHER): Payer: Self-pay

## 2023-10-04 ENCOUNTER — Ambulatory Visit (INDEPENDENT_AMBULATORY_CARE_PROVIDER_SITE_OTHER): Admitting: Family

## 2023-10-04 VITALS — BP 118/77 | HR 96 | Temp 99.3°F | Resp 16 | Ht 65.0 in | Wt 183.0 lb

## 2023-10-04 DIAGNOSIS — Z01419 Encounter for gynecological examination (general) (routine) without abnormal findings: Secondary | ICD-10-CM | POA: Insufficient documentation

## 2023-10-04 DIAGNOSIS — F418 Other specified anxiety disorders: Secondary | ICD-10-CM

## 2023-10-04 NOTE — Assessment & Plan Note (Signed)
  Anxiety exacerbated by divorce and stress, affecting sleep and concentration. No improvement with Zoloft  after two weeks. - Continue Zoloft . Increase to 75 mg in two weeks if no improvement. - Encouraged continued use of breathing exercises and swimming. - Send progress report in two weeks.

## 2023-10-04 NOTE — Patient Instructions (Signed)
 VISIT SUMMARY:  Today, we addressed your concerns about a possible retained tampon and your anxiety related to your ongoing divorce. We discussed your current symptoms and made adjustments to your treatment plan.  YOUR PLAN:  POSSIBLE RETAINED TAMPON: You are concerned about a possible retained tampon, but there was no sign of this on exam.  ANXIETY DISORDER: Your anxiety is exacerbated by your ongoing divorce, affecting your sleep and concentration. You have not noticed improvement with Zoloft  after two weeks. -Continue taking Zoloft . If there is no improvement in two weeks let me know and we can increase the dose to 75 mg. -Continue using breathing exercises and swimming to manage anxiety. -Send a progress report in two weeks to update on your condition.  INSOMNIA: Your difficulty sleeping is likely due to anxiety and stress. -Continue using non-pharmacological methods like breathing exercises and swimming to help with sleep.

## 2023-10-04 NOTE — Progress Notes (Signed)
 Subjective:     Patient ID: Cheyenne Hawkins, female    DOB: 07-19-1975, 48 y.o.   MRN: [id number]  Chief Complaint  Patient presents with   Gynecologic Exam    Gynecologic Exam    Discussed the use of AI scribe software for clinical note transcription with the patient, who gave verbal consent to proceed.  History of Present Illness  Cheyenne Hawkins is a 48 year old female who presents with concerns about a possible retained tampon and anxiety related to her ongoing divorce.  She is concerned about a possible retained tampon, recalling inserting one but not removing it. She perceives an odor but has no abnormal discharge or pain.  She experiences significant anxiety due to her ongoing divorce, leading to difficulty sleeping and concentrating. She manages her anxiety with breathing exercises and swimming. She started taking Zoloft  50 two weeks ago without noticing much improvement so far.  She lives with her children, aged 21, 16, 42, and 4, and feels frustrated over her ex-partner's lack of involvement in their care.     Health Maintenance Due  Topic Date Due   Pneumococcal Vaccine (1 of 2 - PCV) Never done   COVID-19 Vaccine (3 - Pfizer risk series) 03/26/2019   Influenza Vaccine  08/12/2023    Past Medical History:  Diagnosis Date   Anemia, iron  deficiency 11/19/2011   Arthritis    Atrial fibrillation (HCC)    with Graves disease    Blood transfusion without reported diagnosis    COVID-19 virus infection 08/15/2020   Depression 2012   Fatty liver 03/21/2011   GERD (gastroesophageal reflux disease)    Grave's disease 2002   s/p thyroidectomy   Heart murmur    MVP   History of chicken pox    HSV-2 (herpes simplex virus 2) infection    Hypertension     Past Surgical History:  Procedure Laterality Date   CESAREAN SECTION  2001   CESAREAN SECTION  2003   CESAREAN SECTION  2007   COLONOSCOPY  2010   [facility]    THYROIDECTOMY  2003   for graves disease    TUBAL LIGATION      Family History  Problem Relation Age of Onset   Alcohol abuse Mother    Depression Mother    Bipolar disorder Mother    Alcohol abuse Father    Diabetes Father    Hypertension Father    Colon polyps Father    CVA Father    Memory loss Father    Hypertension Sister    Depression Sister    ADD / ADHD Brother    ADD / ADHD Brother    Cancer Maternal Grandmother        colon   Depression Maternal Grandmother    Diabetes Maternal Grandmother    Hypertension Maternal Grandmother    Colon cancer Maternal Grandmother    Colon polyps Maternal Grandmother    Cancer Paternal Grandmother        breast   Diabetes Paternal Grandmother    Hypertension Paternal Industrial/product designer Allergies Daughter    Environmental Allergies Daughter    Environmental Allergies Son    Esophageal cancer Neg Hx    Rectal cancer Neg Hx    Stomach cancer Neg Hx     Social History   Socioeconomic History   Marital status: Married    Spouse name: Not on file   Number of children: 3   Years of education:  Not on file   Highest education level: Master's degree (e.g., MA, MS, MEng, MEd, MSW, MBA)  Occupational History    Employer: [employer]  Tobacco Use   Smoking status: Never    Passive exposure: Past   Smokeless tobacco: Never  Vaping Use   Vaping status: Never Used  Substance and Sexual Activity   Alcohol use: Yes    Alcohol/week: 3.0 standard drinks of alcohol    Types: 3 Glasses of wine per week   Drug use: No   Sexual activity: Yes    Birth control/protection: Surgical    Comment: btl  Other Topics Concern   Not on file  Social History Narrative   Regular exercise:  No Caffeine Use: 2 drinks weekly   Right handed    Social Drivers of Health   Financial Resource Strain: Low Risk  (04/20/2023)   Overall Financial Resource Strain (CARDIA)    Difficulty of Paying Living Expenses: Not hard at all  Food Insecurity: No Food Insecurity (04/20/2023)   Hunger  Vital Sign    Worried About Running Out of Food in the Last Year: Never true    Ran Out of Food in the Last Year: Never true  Transportation Needs: No Transportation Needs (04/20/2023)   PRAPARE - Administrator, Civil Service (Medical): No    Lack of Transportation (Non-Medical): No  Physical Activity: Insufficiently Active (04/20/2023)   Exercise Vital Sign    Days of Exercise per Week: 2 days    Minutes of Exercise per Session: 20 min  Stress: Stress Concern Present (04/20/2023)   [employer] of Occupational Health - Occupational Stress Questionnaire    Feeling of Stress : Rather much  Social Connections: Moderately Integrated (04/20/2023)   Social Connection and Isolation Panel    Frequency of Communication with Friends and Family: Twice a week    Frequency of Social Gatherings with Friends and Family: Once a week    Attends Religious Services: 1 to 4 times per year    Active Member of [employer] or Organizations: Yes    Attends Engineer, structural: More than 4 times per year    Marital Status: Separated  Intimate Partner Violence: Not on file    Outpatient Medications Prior to Visit  Medication Sig Dispense Refill   buPROPion  (WELLBUTRIN  XL) 300 MG 24 hr tablet Take 1 tablet (300 mg total) by mouth daily. 90 tablet 1   ferrous sulfate  325 (65 FE) MG tablet Take 1 tablet (325 mg total) by mouth every other day.     levothyroxine  (SYNTHROID ) 137 MCG tablet Take 1 tablet (137 mcg total) by mouth daily before breakfast. 90 tablet 1   methylPREDNISolone  (MEDROL  DOSEPAK) 4 MG TBPK tablet Take per package instructions 21 tablet 0   metoprolol  succinate (TOPROL  XL) 25 MG 24 hr tablet Take 1 tablet (25 mg total) by mouth daily. 90 tablet 3   Multiple Vitamins-Minerals (MULTIVITAMIN WITH MINERALS) tablet Take 1 tablet by mouth daily. 130 tablet 2   sertraline  (ZOLOFT ) 50 MG tablet Take 1 tablet (50 mg total) by mouth daily. 30 tablet 0   valACYclovir  (VALTREX ) 1000 MG  tablet Take 1 tablet (1,000 mg total) by mouth daily as needed for 5 days for outbreak. (Patient taking differently: Take 1,000 mg by mouth daily as needed (Out break).) 20 tablet 5   No facility-administered medications prior to visit.    No Known Allergies  ROS See HPI    Objective:  Physical Exam Exam conducted with a chaperone present.  Constitutional:      Appearance: Normal appearance.  HENT:     Head: Normocephalic and atraumatic.  Pulmonary:     Effort: Pulmonary effort is normal.  Genitourinary:    Vagina: Normal. No foreign body.     Cervix: Normal.     Uterus: Normal.      Adnexa:        Right: No mass or fullness.         Left: No mass or fullness.    Skin:    General: Skin is warm and dry.  Neurological:     Mental Status: She is alert.      BP 118/77 (BP Location: Right Arm, Patient Position: Sitting, Cuff Size: Normal)   Pulse 96   Temp 99.3 F (37.4 C) (Oral)   Resp 16   Ht 5' 5 (1.651 m)   Wt 183 lb (83 kg)   SpO2 100%   BMI 30.45 kg/m  Wt Readings from Last 3 Encounters:  10/04/23 183 lb (83 kg)  07/20/23 182 lb (82.6 kg)  04/20/23 184 lb (83.5 kg)       Assessment & Plan:   Problem List Items Addressed This Visit       Unprioritized   Normal pelvic exam   Normal speculum exam, normal bimanual exam with no apparent retained tampon.      Depression with anxiety - Primary    Anxiety exacerbated by divorce and stress, affecting sleep and concentration. No improvement with Zoloft  after two weeks. - Continue Zoloft . Increase to 75 mg in two weeks if no improvement. - Encouraged continued use of breathing exercises and swimming. - Send progress report in two weeks.       I am having Cheyenne Hawkins maintain her valACYclovir , metoprolol  succinate, multivitamin with minerals, levothyroxine , ferrous sulfate , buPROPion , methylPREDNISolone , and sertraline .  No orders of the defined types were placed in this encounter.

## 2023-10-04 NOTE — Assessment & Plan Note (Signed)
 Normal speculum exam, normal bimanual exam with no apparent retained tampon.

## 2023-10-05 DIAGNOSIS — F4323 Adjustment disorder with mixed anxiety and depressed mood: Secondary | ICD-10-CM | POA: Diagnosis not present

## 2023-10-06 ENCOUNTER — Other Ambulatory Visit: Payer: Self-pay | Admitting: Family

## 2023-10-10 ENCOUNTER — Other Ambulatory Visit (HOSPITAL_BASED_OUTPATIENT_CLINIC_OR_DEPARTMENT_OTHER): Payer: Self-pay

## 2023-10-10 MED ORDER — VALACYCLOVIR HCL 1 G PO TABS
1000.0000 mg | ORAL_TABLET | Freq: Every day | ORAL | 5 refills | Status: AC | PRN
  Filled 2023-10-10: qty 20, 20d supply, fill #0

## 2023-10-12 ENCOUNTER — Other Ambulatory Visit (HOSPITAL_BASED_OUTPATIENT_CLINIC_OR_DEPARTMENT_OTHER): Payer: Self-pay

## 2023-10-25 ENCOUNTER — Other Ambulatory Visit (HOSPITAL_BASED_OUTPATIENT_CLINIC_OR_DEPARTMENT_OTHER): Payer: Self-pay

## 2023-10-30 ENCOUNTER — Encounter: Payer: Self-pay | Admitting: Family

## 2023-10-30 DIAGNOSIS — N760 Acute vaginitis: Secondary | ICD-10-CM

## 2023-10-31 ENCOUNTER — Other Ambulatory Visit (HOSPITAL_BASED_OUTPATIENT_CLINIC_OR_DEPARTMENT_OTHER): Payer: Self-pay

## 2023-10-31 MED ORDER — FLUCONAZOLE 150 MG PO TABS
ORAL_TABLET | ORAL | 0 refills | Status: AC
  Filled 2023-10-31: qty 2, 2d supply, fill #0

## 2023-11-02 ENCOUNTER — Other Ambulatory Visit (HOSPITAL_BASED_OUTPATIENT_CLINIC_OR_DEPARTMENT_OTHER): Payer: Self-pay

## 2023-11-23 ENCOUNTER — Other Ambulatory Visit: Payer: Self-pay | Admitting: Family

## 2023-11-23 ENCOUNTER — Other Ambulatory Visit: Payer: Self-pay

## 2023-11-23 ENCOUNTER — Other Ambulatory Visit (HOSPITAL_BASED_OUTPATIENT_CLINIC_OR_DEPARTMENT_OTHER): Payer: Self-pay

## 2023-11-23 ENCOUNTER — Ambulatory Visit: Admitting: Family

## 2023-11-23 VITALS — BP 118/70 | HR 89 | Temp 98.9°F | Resp 16 | Ht 65.0 in | Wt 182.0 lb

## 2023-11-23 DIAGNOSIS — I10 Essential (primary) hypertension: Secondary | ICD-10-CM

## 2023-11-23 DIAGNOSIS — B351 Tinea unguium: Secondary | ICD-10-CM | POA: Diagnosis not present

## 2023-11-23 DIAGNOSIS — E039 Hypothyroidism, unspecified: Secondary | ICD-10-CM

## 2023-11-23 DIAGNOSIS — F418 Other specified anxiety disorders: Secondary | ICD-10-CM | POA: Diagnosis not present

## 2023-11-23 DIAGNOSIS — Z5181 Encounter for therapeutic drug level monitoring: Secondary | ICD-10-CM | POA: Insufficient documentation

## 2023-11-23 DIAGNOSIS — M5432 Sciatica, left side: Secondary | ICD-10-CM | POA: Diagnosis not present

## 2023-11-23 DIAGNOSIS — D509 Iron deficiency anemia, unspecified: Secondary | ICD-10-CM | POA: Diagnosis not present

## 2023-11-23 LAB — IBC + FERRITIN
Ferritin: 10.2 ng/mL (ref 10.0–291.0)
Iron: 134 ug/dL (ref 42–145)
Saturation Ratios: 31.9 % (ref 20.0–50.0)
TIBC: 420 ug/dL (ref 250.0–450.0)
Transferrin: 300 mg/dL (ref 212.0–360.0)

## 2023-11-23 LAB — CBC WITH DIFFERENTIAL/PLATELET
Basophils Absolute: 0 K/uL (ref 0.0–0.1)
Basophils Relative: 0.8 % (ref 0.0–3.0)
Eosinophils Absolute: 0.1 K/uL (ref 0.0–0.7)
Eosinophils Relative: 1.3 % (ref 0.0–5.0)
HCT: 34.7 % — ABNORMAL LOW (ref 36.0–46.0)
Hemoglobin: 11.3 g/dL — ABNORMAL LOW (ref 12.0–15.0)
Lymphocytes Relative: 27.7 % (ref 12.0–46.0)
Lymphs Abs: 1.4 K/uL (ref 0.7–4.0)
MCHC: 32.5 g/dL (ref 30.0–36.0)
MCV: 84.7 fl (ref 78.0–100.0)
Monocytes Absolute: 0.6 K/uL (ref 0.1–1.0)
Monocytes Relative: 12 % (ref 3.0–12.0)
Neutro Abs: 3 K/uL (ref 1.4–7.7)
Neutrophils Relative %: 58.2 % (ref 43.0–77.0)
Platelets: 304 K/uL (ref 150.0–400.0)
RBC: 4.1 Mil/uL (ref 3.87–5.11)
RDW: 14 % (ref 11.5–15.5)
WBC: 5.1 K/uL (ref 4.0–10.5)

## 2023-11-23 LAB — TSH: TSH: 0.18 u[IU]/mL — ABNORMAL LOW (ref 0.35–5.50)

## 2023-11-23 MED ORDER — SERTRALINE HCL 100 MG PO TABS
100.0000 mg | ORAL_TABLET | Freq: Every day | ORAL | 1 refills | Status: AC
  Filled 2023-11-23: qty 90, 90d supply, fill #0

## 2023-11-23 MED ORDER — TERBINAFINE HCL 250 MG PO TABS
250.0000 mg | ORAL_TABLET | Freq: Every day | ORAL | 0 refills | Status: DC
  Filled 2023-11-23: qty 30, 30d supply, fill #0

## 2023-11-23 NOTE — Assessment & Plan Note (Signed)
  Chronic toenail fungus with incomplete previous Lamisil  treatment. Topical treatments ineffective. - Prescribed Lamisil  for 4 weeks. - Ordered liver function tests in 1 month. - If liver function tests are normal, prescribe additional 8 weeks of Lamisil .

## 2023-11-23 NOTE — Assessment & Plan Note (Signed)
 Lab Results  Component Value Date   TSH 1.17 04/20/2023   Feeling well on 137 mcg of synthroid , update TSH.

## 2023-11-23 NOTE — Assessment & Plan Note (Signed)
  Increased irritability and sleep difficulty, possibly due to stressors and hormonal changes. Current treatment includes Wellbutrin  and increased Zoloft . - Increased Zoloft  to 100 mg daily. - Scheduled follow-up in 3 months to assess response.

## 2023-11-23 NOTE — Assessment & Plan Note (Signed)
 Lab Results  Component Value Date   WBC 6.1 04/20/2023   HGB 11.6 (L) 04/20/2023   HCT 36.8 04/20/2023   MCV 85.2 04/20/2023   PLT 412.0 (H) 04/20/2023

## 2023-11-23 NOTE — Progress Notes (Signed)
 Subjective:     Patient ID: Cheyenne Hawkins, female    DOB: 1975/08/24, 48 y.o.   MRN: [id number]  Chief Complaint  Patient presents with   Hypothyroidism    Here for follow up   Depression    Here for follow up   Anxiety    Here for follow up   Hip Pain    Patient reports left hip pain   Nail Problem    Patient reports having toe nail fungus, bilateral    Depression        Past medical history includes anxiety.   Anxiety    Hip Pain     Discussed the use of AI scribe software for clinical note transcription with the patient, who gave verbal consent to proceed.  History of Present Illness Cheyenne Hawkins is a 48 year old female who presents with left hip pain and toenail fungus.  She has been experiencing left hip pain for approximately six months. The pain is described as shooting down the back of her leg, resembling a pinching sensation, which she associates with sciatica. She has not found relief with meloxicam  in the past. She is concerned about her risk of osteoarthritis due to a family history, as her great aunt had severe joint issues.  She has a history of toenail fungus and recalls being prescribed Lamisil  previously but did not complete the full twelve-week course. She is uncertain about the exact duration she took the medication.  She is currently taking Toprol  XL for blood pressure. She has discontinued Adderall, which she believes has improved her heart rate. Regarding her mood, she is on Wellbutrin  and Zoloft . She experiences irritability and a short tolerance, attributing it to stress from managing household responsibilities and life changes. She has difficulty sleeping, often requiring Z-Quil to fall asleep, but is easily awakened by noise.  She is mildly anemic and takes an iron  supplement, which causes constipation. She occasionally uses warm produce or a suppository to alleviate this.  She is currently working night shifts but plans to start a new  job at the [employer] on December 8th, which she hopes will be less stressful.      Health Maintenance Due  Topic Date Due   Pneumococcal Vaccine (1 of 2 - PCV) Never done   COVID-19 Vaccine (3 - Pfizer risk series) 03/26/2019    Past Medical History:  Diagnosis Date   Anemia, iron  deficiency 11/19/2011   Arthritis    Atrial fibrillation (HCC)    with Graves disease    Blood transfusion without reported diagnosis    COVID-19 virus infection 08/15/2020   Depression 2012   Fatty liver 03/21/2011   GERD (gastroesophageal reflux disease)    Grave's disease 2002   s/p thyroidectomy   Heart murmur    MVP   History of chicken pox    HSV-2 (herpes simplex virus 2) infection    Hypertension     Past Surgical History:  Procedure Laterality Date   CESAREAN SECTION  2001   CESAREAN SECTION  2003   CESAREAN SECTION  2007   COLONOSCOPY  2010   [facility]    THYROIDECTOMY  2003   for graves disease   TUBAL LIGATION      Family History  Problem Relation Age of Onset   Alcohol abuse Mother    Depression Mother    Bipolar disorder Mother    Alcohol abuse Father    Diabetes Father    Hypertension Father  Colon polyps Father    CVA Father    Memory loss Father    Hypertension Sister    Depression Sister    ADD / ADHD Brother    ADD / ADHD Brother    Cancer Maternal Grandmother        colon   Depression Maternal Grandmother    Diabetes Maternal Grandmother    Hypertension Maternal Grandmother    Colon cancer Maternal Grandmother    Colon polyps Maternal Grandmother    Cancer Paternal Grandmother        breast   Diabetes Paternal Grandmother    Hypertension Paternal Industrial/product Designer Allergies Daughter    Environmental Allergies Daughter    Environmental Allergies Son    Esophageal cancer Neg Hx    Rectal cancer Neg Hx    Stomach cancer Neg Hx     Social History   Socioeconomic History   Marital status: Married    Spouse name: Not on file    Number of children: 3   Years of education: Not on file   Highest education level: Master's degree (e.g., MA, MS, MEng, MEd, MSW, MBA)  Occupational History    Employer: [employer]  Tobacco Use   Smoking status: Never    Passive exposure: Past   Smokeless tobacco: Never  Vaping Use   Vaping status: Never Used  Substance and Sexual Activity   Alcohol use: Yes    Alcohol/week: 3.0 standard drinks of alcohol    Types: 3 Glasses of wine per week   Drug use: No   Sexual activity: Yes    Birth control/protection: Surgical    Comment: btl  Other Topics Concern   Not on file  Social History Narrative   Regular exercise:  No Caffeine Use: 2 drinks weekly   Right handed    Social Drivers of Health   Financial Resource Strain: Low Risk  (04/20/2023)   Overall Financial Resource Strain (CARDIA)    Difficulty of Paying Living Expenses: Not hard at all  Food Insecurity: No Food Insecurity (04/20/2023)   Hunger Vital Sign    Worried About Running Out of Food in the Last Year: Never true    Ran Out of Food in the Last Year: Never true  Transportation Needs: No Transportation Needs (04/20/2023)   PRAPARE - Administrator, Civil Service (Medical): No    Lack of Transportation (Non-Medical): No  Physical Activity: Insufficiently Active (04/20/2023)   Exercise Vital Sign    Days of Exercise per Week: 2 days    Minutes of Exercise per Session: 20 min  Stress: Stress Concern Present (04/20/2023)   [employer] of Occupational Health - Occupational Stress Questionnaire    Feeling of Stress : Rather much  Social Connections: Moderately Integrated (04/20/2023)   Social Connection and Isolation Panel    Frequency of Communication with Friends and Family: Twice a week    Frequency of Social Gatherings with Friends and Family: Once a week    Attends Religious Services: 1 to 4 times per year    Active Member of [employer] or Organizations: Yes    Attends Engineer, Structural: More than  4 times per year    Marital Status: Separated  Intimate Partner Violence: Not on file    Outpatient Medications Prior to Visit  Medication Sig Dispense Refill   buPROPion  (WELLBUTRIN  XL) 300 MG 24 hr tablet Take 1 tablet (300 mg total) by mouth daily. 90 tablet 1  ferrous sulfate  325 (65 FE) MG tablet Take 1 tablet (325 mg total) by mouth every other day.     levothyroxine  (SYNTHROID ) 137 MCG tablet Take 1 tablet (137 mcg total) by mouth daily before breakfast. 90 tablet 1   metoprolol  succinate (TOPROL  XL) 25 MG 24 hr tablet Take 1 tablet (25 mg total) by mouth daily. 90 tablet 3   Multiple Vitamins-Minerals (MULTIVITAMIN WITH MINERALS) tablet Take 1 tablet by mouth daily. 130 tablet 2   valACYclovir  (VALTREX ) 1000 MG tablet Take 1 tablet (1,000 mg total) by mouth daily as needed for 5 days for outbreak. 20 tablet 5   sertraline  (ZOLOFT ) 50 MG tablet Take 1 tablet (50 mg total) by mouth daily. 30 tablet 0   fluconazole  (DIFLUCAN ) 150 MG tablet Take 1 tablet by mouth now and again in 3 days if symptoms persist. 2 tablet 0   methylPREDNISolone  (MEDROL  DOSEPAK) 4 MG TBPK tablet Take per package instructions 21 tablet 0   No facility-administered medications prior to visit.    No Known Allergies  Review of Systems  Psychiatric/Behavioral:  Positive for depression.    See HPI    Objective:    Physical Exam Constitutional:      General: She is not in acute distress.    Appearance: Normal appearance. She is well-developed.  HENT:     Head: Normocephalic and atraumatic.     Right Ear: External ear normal.     Left Ear: External ear normal.  Eyes:     General: No scleral icterus. Neck:     Thyroid : No thyromegaly.  Cardiovascular:     Rate and Rhythm: Normal rate and regular rhythm.     Heart sounds: Normal heart sounds. No murmur heard. Pulmonary:     Effort: Pulmonary effort is normal. No respiratory distress.     Breath sounds: Normal breath sounds. No wheezing.   Musculoskeletal:     Cervical back: Neck supple.  Skin:    General: Skin is warm and dry.  Neurological:     Mental Status: She is alert and oriented to person, place, and time.  Psychiatric:        Mood and Affect: Mood normal.        Behavior: Behavior normal.        Thought Content: Thought content normal.        Judgment: Judgment normal.     Discolored great toenail  BP 118/70 (BP Location: Right Arm, Patient Position: Sitting, Cuff Size: Normal)   Pulse 89   Temp 98.9 F (37.2 C) (Oral)   Resp 16   Ht 5' 5 (1.651 m)   Wt 182 lb (82.6 kg)   SpO2 98%   BMI 30.29 kg/m  Wt Readings from Last 3 Encounters:  11/23/23 182 lb (82.6 kg)  10/04/23 183 lb (83 kg)  07/20/23 182 lb (82.6 kg)       Assessment & Plan:     Assessment & Plan     I have discontinued Rabecca G. Bicking's sertraline . I am also having her start on sertraline  and terbinafine . Additionally, I am having her maintain her metoprolol  succinate, multivitamin with minerals, levothyroxine , ferrous sulfate , buPROPion , methylPREDNISolone , valACYclovir , and fluconazole .  Meds ordered this encounter  Medications   sertraline  (ZOLOFT ) 100 MG tablet    Sig: Take 1 tablet (100 mg total) by mouth daily.    Dispense:  90 tablet    Refill:  1    Supervising Provider:   DOMENICA BLACKBIRD A [[id number]]  terbinafine  (LAMISIL ) 250 MG tablet    Sig: Take 1 tablet (250 mg total) by mouth daily.    Dispense:  30 tablet    Refill:  0    Supervising Provider:   DOMENICA BLACKBIRD A [[id number]]

## 2023-11-23 NOTE — Patient Instructions (Signed)
  VISIT SUMMARY: Today, you were seen for left hip pain and toenail fungus. We also discussed your ongoing issues with depression, anxiety, blood pressure, anemia, and hypothyroidism. We reviewed your current medications and made some adjustments to better manage your symptoms.  YOUR PLAN: -TINEA UNGUIUM (TOENAIL FUNGUS): Toenail fungus is a common infection that causes thickened, discolored, and brittle nails. You will start a 4-week course of Lamisil , and we will check your liver function in one month. If your liver tests are normal, you will continue Lamisil  for an additional 8 weeks.  -LEFT-SIDED SCIATICA: Sciatica is pain that radiates along the path of the sciatic nerve, which runs down your leg. You are being referred to physical therapy. If there is no improvement in 6 weeks, we will consider getting an MRI.  -DEPRESSION WITH ANXIETY: Depression and anxiety are mood disorders that can cause irritability, sleep difficulties, and stress. Your Zoloft  dose has been increased to 100 mg daily. We will follow up in 3 months to see how you are responding to the treatment.  -ESSENTIAL HYPERTENSION: Hypertension is high blood pressure. Your blood pressure is well-controlled with your current medication, so no changes are needed.  -IRON  DEFICIENCY ANEMIA: Anemia is a condition where you don't have enough healthy red blood cells to carry adequate oxygen to your body's tissues. Continue taking your iron  supplements. We have ordered a complete blood count (CBC) to monitor your anemia.  -HYPOTHYROIDISM: Hypothyroidism is a condition where your thyroid  gland doesn't produce enough thyroid  hormone. Your condition is well-managed with your current dose of levothyroxine . We have ordered a TSH test to monitor your thyroid  function.  INSTRUCTIONS: Please follow up in one month for liver function tests and in three months to assess your response to the increased Zoloft  dose. If your hip pain does not improve with  physical therapy in six weeks, we will consider an MRI. Continue taking your current medications as prescribed and monitor your symptoms. If you have any new or worsening symptoms, please contact our office.                      Contains text generated by Abridge.                                 Contains text generated by Abridge.

## 2023-11-23 NOTE — Assessment & Plan Note (Signed)
  Chronic left-sided sciatica with lumbar spine involvement. Meloxicam  ineffective. - Referred to physical therapy. - If no improvement in 6 weeks, consider MRI.

## 2023-11-23 NOTE — Telephone Encounter (Signed)
 Thyroid  med should be decreased slightly to 125 mcg daily. Update tsh in 6 weeks.

## 2023-11-23 NOTE — Assessment & Plan Note (Signed)
 Stable on toprol  xl 25mg  once daily.

## 2023-11-24 ENCOUNTER — Other Ambulatory Visit: Payer: Self-pay

## 2023-11-24 ENCOUNTER — Other Ambulatory Visit (HOSPITAL_BASED_OUTPATIENT_CLINIC_OR_DEPARTMENT_OTHER): Payer: Self-pay

## 2023-11-24 ENCOUNTER — Ambulatory Visit: Attending: Family

## 2023-11-24 DIAGNOSIS — M5432 Sciatica, left side: Secondary | ICD-10-CM | POA: Diagnosis not present

## 2023-11-24 DIAGNOSIS — M5442 Lumbago with sciatica, left side: Secondary | ICD-10-CM | POA: Insufficient documentation

## 2023-11-24 DIAGNOSIS — G8929 Other chronic pain: Secondary | ICD-10-CM | POA: Insufficient documentation

## 2023-11-24 DIAGNOSIS — M25652 Stiffness of left hip, not elsewhere classified: Secondary | ICD-10-CM | POA: Insufficient documentation

## 2023-11-24 DIAGNOSIS — R252 Cramp and spasm: Secondary | ICD-10-CM | POA: Diagnosis not present

## 2023-11-24 MED ORDER — LEVOTHYROXINE SODIUM 125 MCG PO TABS
125.0000 ug | ORAL_TABLET | Freq: Every day | ORAL | 3 refills | Status: AC
  Filled 2023-11-24: qty 90, 90d supply, fill #0

## 2023-11-24 NOTE — Telephone Encounter (Signed)
 Per Melissa- okay to check TSH on 12/23/23.    Spoke w/ Pt- informed of results and recommendations. Pt verbalized understanding.

## 2023-11-24 NOTE — Therapy (Signed)
 OUTPATIENT PHYSICAL THERAPY THORACOLUMBAR EVALUATION   Patient Name: Cheyenne Hawkins MRN: [id number] DOB:12-29-1975, 48 y.o., female Today's Date: 11/24/2023  END OF SESSION:  PT End of Session - 11/24/23 0942     Visit Number 1    Date for Recertification  01/19/24    PT Start Time 0938    PT Stop Time 1017    PT Time Calculation (min) 39 min    Activity Tolerance Patient tolerated treatment well    Behavior During Therapy [facility] for tasks assessed/performed          Past Medical History:  Diagnosis Date   Anemia, iron  deficiency 11/19/2011   Arthritis    Atrial fibrillation (HCC)    with Graves disease    Blood transfusion without reported diagnosis    COVID-19 virus infection 08/15/2020   Depression 2012   Fatty liver 03/21/2011   GERD (gastroesophageal reflux disease)    Grave's disease 2002   s/p thyroidectomy   Heart murmur    MVP   History of chicken pox    HSV-2 (herpes simplex virus 2) infection    Hypertension    Past Surgical History:  Procedure Laterality Date   CESAREAN SECTION  2001   CESAREAN SECTION  2003   CESAREAN SECTION  2007   COLONOSCOPY  2010   [facility]    THYROIDECTOMY  2003   for graves disease   TUBAL LIGATION     Patient Active Problem List   Diagnosis Date Noted   Therapeutic drug monitoring 11/23/2023   Sciatica of left side 11/23/2023   Normal pelvic exam 10/04/2023   Lumbar radiculopathy 07/20/2023   Injury of left thumb 04/20/2023   Elbow pain, chronic, right 12/08/2022   Menorrhagia with regular cycle 11/26/2022   Attention deficit 06/08/2022   Olecranon bursitis of right elbow 04/19/2022   Essential hypertension 03/10/2022   Positive ANA (antinuclear antibody) 12/12/2021   Obesity (BMI 30.0-34.9) 12/08/2021   Perimenopause 12/08/2021   Arthralgia 12/08/2021   Screen for STD (sexually transmitted disease) 03/13/2021   Abnormal urine odor 03/13/2021   Onychomycosis 05/01/2020   Paroxysmal atrial fibrillation (HCC)  07/26/2017   Abnormal EKG 06/16/2017   Mitral valve prolapse 06/16/2017   Tachycardia 08/12/2015   Hypothyroidism 06/16/2012   Anemia, iron  deficiency 11/19/2011   Skin picking habit 11/05/2011   Preventative health care 08/02/2011   Galactorrhea 08/02/2011   HSV-2 (herpes simplex virus 2) infection 07/09/2011   Depression with anxiety 08/18/2010   IBS (irritable bowel syndrome) 08/18/2010   GERD (gastroesophageal reflux disease) 08/18/2010   Atypical chest pain 04/24/2008   Constipation 04/19/2008    PCP: MALVATHEORA Floretta Setter, NP  REFERRING PROVIDER: same  REFERRING DIAG: L sciatica  Rationale for Evaluation and Treatment: Rehabilitation  THERAPY DIAG:  Chronic left-sided low back pain with left-sided sciatica  Stiffness of left hip, not elsewhere classified  Cramp and spasm  ONSET DATE: June 2025  SUBJECTIVE:  SUBJECTIVE STATEMENT: No known incident, woke up with this L sciatic pain,  hurts pretty much consistently, improves after I walk and move around a little bit  PERTINENT HISTORY:  The pt referred by PCP to PT due to L sciatic pain  PAIN:  Are you having pain? Yes: NPRS scale: 0 to 4 Pain location: L gluteals and hamstrings primarily occasionally entire lower leg also tingling Pain description: deep ache pain Aggravating factors: sitting, bending Relieving factors: walking helps, no meds  PRECAUTIONS: None  RED FLAGS: None   WEIGHT BEARING RESTRICTIONS: No  FALLS:  Has patient fallen in last 6 months? 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  DIAGNOSTIC FINDINGS:  na  PATIENT SURVEYS: Modified Oswestry Low Back Pain Disability Questionnaire: 18 / 50 = 36.0 % COGNITION: Overall cognitive status: Within functional limits for tasks assessed     SENSATION: WFL  MUSCLE LENGTH: Hamstrings: Right 20 deg; Left 20 deg Thomas test: Right wnl deg; Left wnl deg  POSTURE: mild L lateral pelvic shift in standing noted Noted with forward lumbar flexion that pt side bends to L in lumbar region and rotates to L thoracic region   PALPATION: Tender with palpation of L gluteals medius, minimus, TFL, L hamstring lat and midline and L piriformis Tender with PA glides L3/4 region, relief of pain with PA glide L 4./5  LUMBAR ROM:   AROM eval  Flexion Fingertips to tibial tubercle  Extension wnl  Right lateral flexion 40  Left lateral flexion 40  Right rotation   Left rotation    (Blank rows = not tested)  LOWER EXTREMITY ROM:   all LE ROM WNL  LOWER EXTREMITY MMT:  all wnl, able to heel walk and toe walk I Mild discomfort and shaking with L SLR with resistance  LUMBAR SPECIAL TESTS:  Straight leg raise test: Positive and Slump test: Positive  FUNCTIONAL TESTS:  30 seconds chair stand test  13  GAIT: Distance walked: in clinic  Assistive device utilized: None Level of assistance: Complete Independence Comments: no deficits noted  TREATMENT DATE: 11/24/23 Eval:  Side lying on L for muscle energy for lumbar flex,rot,SB restriction 4 reps Side lying R for theragun massage L gluteals and lat hip musculature  Instructed in piriformis stretch in supine                                                                                                                                PATIENT EDUCATION:  Education details: POC, goals Person educated:  Patient Education method: Programmer, Multimedia, Demonstration,  Tactile cues, Verbal cues, and Handouts Education comprehension: verbalized understanding, returned demonstration, verbal cues required, and tactile cues required  HOME EXERCISE PROGRAM: Access Code: [email or web address] URL: [email or web address] Date: 11/24/2023 Prepared by: Adelbert Gaspard  Exercises - Supine Figure 4 Piriformis Stretch  - 1 x daily - 7 x weekly - 3 sets - 6 reps - 10 hold  ASSESSMENT:  CLINICAL IMPRESSION: Patient is a 48 y.o. female  who was evaluated today by physical therapy due to persistent L sided sciatica of 5 weeks duration.  She presents with + neural tension testing for L4/5 and L5/S1.  No LE weakness noted.  Also very tender, thickened L post/lat hip musculature.  Noted restriction and movement changes with L lumbar concavity with forward bending motion.  She should benefit from PT to address her pain and movement restrictions.  Responded today well to initial manual techniques and specific stretching L hip.    OBJECTIVE IMPAIRMENTS: decreased knowledge of condition, decreased ROM, hypomobility, increased fascial restrictions, impaired flexibility, improper body mechanics, postural dysfunction, and pain.   ACTIVITY LIMITATIONS: lifting, bending, sitting, squatting, sleeping, and stairs  PARTICIPATION LIMITATIONS: cleaning, laundry, driving, community activity, and yard work  PERSONAL FACTORS: Fitness and 1-2 comorbidities: graves disease are also affecting patient's functional outcome.   REHAB POTENTIAL: Good  CLINICAL DECISION MAKING: Evolving/moderate complexity  EVALUATION COMPLEXITY: Moderate   GOALS: Goals reviewed with patient? Yes  SHORT TERM GOALS: Target date: 2 weeks, 12/08/23  I HEP Baseline: Goal status: INITIAL   LONG TERM GOALS: Target date: 8 weeks, 01/19/23  Modified Oswestry improve Modified Oswestry Low Back Pain Disability Questionnaire: 18 / 50 = 36.0 % improve to 25% or  less disability Baseline:  Goal status: INITIAL  2.  Improve lumbar flexion ROM to wnl, without increase in radicular Sx L LE:  Baseline: flexion  finger tips to tibial tubercles B with L sciatic Sx Goal status: INITIAL  3.  Normal L hip flexor strength( measured by SLR) Baseline: fasiculations, pain L sciatic with this movement Goal status: INITIAL  4.  Able to ascend steps I without pain L LE Baseline: consistent pain and difficulty Goal status: INITIAL   PLAN:  PT FREQUENCY: 1-2x/week  PT DURATION: 8 weeks  PLANNED INTERVENTIONS: 97110-Therapeutic exercises, 97530- Therapeutic activity, [id number]- Neuromuscular re-education, 97535- Self Care, [id number]- Manual therapy, and Patient/Family education.  PLAN FOR NEXT SESSION: continue with manual techniques for L posterior and lateral hips, instruct in other stretches to improve Lumbar flexibility and movement   Rajah Tagliaferro L Thuy Atilano, PT, DPT, OCS 11/24/2023, 1:30 PM

## 2023-11-24 NOTE — Telephone Encounter (Signed)
 Pt coming back on 12/23/23 for 1 month blood work, okay to do TSH at that time?

## 2023-11-29 ENCOUNTER — Ambulatory Visit

## 2023-11-29 DIAGNOSIS — M5432 Sciatica, left side: Secondary | ICD-10-CM | POA: Diagnosis not present

## 2023-11-29 DIAGNOSIS — G8929 Other chronic pain: Secondary | ICD-10-CM

## 2023-11-29 DIAGNOSIS — M25652 Stiffness of left hip, not elsewhere classified: Secondary | ICD-10-CM

## 2023-11-29 DIAGNOSIS — M5442 Lumbago with sciatica, left side: Secondary | ICD-10-CM | POA: Diagnosis not present

## 2023-11-29 DIAGNOSIS — R252 Cramp and spasm: Secondary | ICD-10-CM | POA: Diagnosis not present

## 2023-11-29 NOTE — Therapy (Signed)
 OUTPATIENT PHYSICAL THERAPY THORACOLUMBAR TREATMENT   Patient Name: Cheyenne Hawkins MRN: [id number] DOB:Sep 26, 1975, 48 y.o., female Today's Date: 11/29/2023  END OF SESSION:  PT End of Session - 11/29/23 1138     Visit Number 2    Date for Recertification  01/19/24    PT Start Time 1110   pt late   PT Stop Time 1147    PT Time Calculation (min) 37 min    Activity Tolerance Patient tolerated treatment well    Behavior During Therapy [facility] for tasks assessed/performed           Past Medical History:  Diagnosis Date   Anemia, iron  deficiency 11/19/2011   Arthritis    Atrial fibrillation (HCC)    with Graves disease    Blood transfusion without reported diagnosis    COVID-19 virus infection 08/15/2020   Depression 2012   Fatty liver 03/21/2011   GERD (gastroesophageal reflux disease)    Grave's disease 2002   s/p thyroidectomy   Heart murmur    MVP   History of chicken pox    HSV-2 (herpes simplex virus 2) infection    Hypertension    Past Surgical History:  Procedure Laterality Date   CESAREAN SECTION  2001   CESAREAN SECTION  2003   CESAREAN SECTION  2007   COLONOSCOPY  2010   [facility]    THYROIDECTOMY  2003   for graves disease   TUBAL LIGATION     Patient Active Problem List   Diagnosis Date Noted   Therapeutic drug monitoring 11/23/2023   Sciatica of left side 11/23/2023   Normal pelvic exam 10/04/2023   Lumbar radiculopathy 07/20/2023   Injury of left thumb 04/20/2023   Elbow pain, chronic, right 12/08/2022   Menorrhagia with regular cycle 11/26/2022   Attention deficit 06/08/2022   Olecranon bursitis of right elbow 04/19/2022   Essential hypertension 03/10/2022   Positive ANA (antinuclear antibody) 12/12/2021   Obesity (BMI 30.0-34.9) 12/08/2021   Perimenopause 12/08/2021   Arthralgia 12/08/2021   Screen for STD (sexually transmitted disease) 03/13/2021   Abnormal urine odor 03/13/2021   Onychomycosis 05/01/2020   Paroxysmal atrial fibrillation  (HCC) 07/26/2017   Abnormal EKG 06/16/2017   Mitral valve prolapse 06/16/2017   Tachycardia 08/12/2015   Hypothyroidism 06/16/2012   Anemia, iron  deficiency 11/19/2011   Skin picking habit 11/05/2011   Preventative health care 08/02/2011   Galactorrhea 08/02/2011   HSV-2 (herpes simplex virus 2) infection 07/09/2011   Depression with anxiety 08/18/2010   IBS (irritable bowel syndrome) 08/18/2010   GERD (gastroesophageal reflux disease) 08/18/2010   Atypical chest pain 04/24/2008   Constipation 04/19/2008    PCP: MALVATHEORA Floretta Setter, NP  REFERRING PROVIDER: same  REFERRING DIAG: L sciatica  Rationale for Evaluation and Treatment: Rehabilitation  THERAPY DIAG:  Chronic left-sided low back pain with left-sided sciatica  Stiffness of left hip, not elsewhere classified  Cramp and spasm  ONSET DATE: June 2025  SUBJECTIVE:  SUBJECTIVE STATEMENT: Pt reports sore L glutes, and posterior tigh  PERTINENT HISTORY:  The pt referred by PCP to PT due to L sciatic pain  PAIN:  Are you having pain? Yes: NPRS scale: 6/10 Pain location: L gluteals and hamstrings primarily occasionally entire lower leg also tingling Pain description: deep ache pain Aggravating factors: sitting, bending Relieving factors: walking helps, no meds  PRECAUTIONS: None  RED FLAGS: None   WEIGHT BEARING RESTRICTIONS: No  FALLS:  Has patient fallen in last 6 months? 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  DIAGNOSTIC FINDINGS:  na  PATIENT SURVEYS: Modified Oswestry Low Back Pain Disability Questionnaire: 18 / 50 = 36.0 % COGNITION: Overall cognitive status: Within functional limits for tasks assessed     SENSATION: WFL  MUSCLE LENGTH: Hamstrings: Right 20 deg; Left 20 deg Thomas test: Right wnl deg; Left wnl deg  POSTURE: mild L lateral pelvic shift in standing noted Noted with forward lumbar flexion that pt side bends to L in lumbar region and rotates to L thoracic region   PALPATION: Tender with palpation of L gluteals medius, minimus, TFL, L hamstring lat and midline and L piriformis Tender with PA glides L3/4 region, relief of pain with PA glide L 4./5  LUMBAR ROM:   AROM eval  Flexion Fingertips to tibial tubercle  Extension wnl  Right lateral flexion 40  Left lateral flexion 40  Right rotation   Left rotation    (Blank rows = not tested)  LOWER EXTREMITY ROM:   all LE ROM WNL  LOWER EXTREMITY MMT:  all wnl, able to heel walk and toe walk I Mild discomfort and shaking with L SLR with resistance  LUMBAR SPECIAL TESTS:  Straight leg raise test: Positive and Slump test: Positive  FUNCTIONAL TESTS:  30 seconds chair stand test  13  GAIT: Distance walked: in clinic  Assistive device utilized: None Level of assistance: Complete Independence Comments: no deficits noted  TREATMENT DATE:  11/29/23 Prone IASTM to L glutes and piriformis, manual TPR as well Supine KTOS L hip 2x62min LTR both ways x 10 Sciatic nerve glide supine- increased pain Seated sciatic nerve glide - increased pain Standing lumbar extension x 10 Prone knee bend x 10 Prone hip extension x 10 B  11/24/23 Eval:  Side lying on L for muscle energy for lumbar flex,rot,SB restriction 4 reps Side lying R for theragun massage L gluteals and lat hip musculature  Instructed in piriformis stretch  in supine  PATIENT EDUCATION:  Education details: POC, goals Person educated: Patient Education method: Explanation, Demonstration, Tactile cues, Verbal cues, and Handouts Education comprehension: verbalized understanding, returned demonstration, verbal cues required, and tactile cues required  HOME EXERCISE PROGRAM: Access Code: [email or web address] URL: [email or web address] Date: 11/29/2023 Prepared by: Adriann Ballweg  Exercises - Supine Figure 4 Piriformis Stretch  - 1 x daily - 7 x weekly - 3 sets - 6 reps - 10 hold - Prone Press Up On Elbows  - 1 x daily - 7 x weekly - 3 sets - 10 reps - Prone Press Up  - 1 x daily - 7 x weekly - 3 sets - 10 reps - Standing Lumbar Extension  - 1 x daily - 7 x weekly - 3 sets - 10 reps  ASSESSMENT:  CLINICAL IMPRESSION: Pt 10 min late today. Noted TTP in L glutes and piriformis today. Unable to tolerate sciatic nerve glide today d/t pain. She well tolerated the extension exercises and added these to HEP.    Patient is a 48 y.o. female  who was evaluated today by physical therapy due to persistent L sided sciatica of 5 weeks duration.  She presents with + neural tension testing for L4/5 and L5/S1.  No LE weakness noted.  Also very tender, thickened L post/lat hip musculature.  Noted restriction and movement changes with L lumbar concavity with forward bending motion.  She should benefit from PT to address her pain and movement restrictions.  Responded today well to initial manual techniques and specific stretching L hip.    OBJECTIVE IMPAIRMENTS: decreased knowledge of condition, decreased ROM, hypomobility, increased fascial restrictions, impaired flexibility, improper body mechanics, postural dysfunction, and pain.   ACTIVITY LIMITATIONS: lifting, bending, sitting, squatting, sleeping, and stairs  PARTICIPATION  LIMITATIONS: cleaning, laundry, driving, community activity, and yard work  PERSONAL FACTORS: Fitness and 1-2 comorbidities: graves disease are also affecting patient's functional outcome.   REHAB POTENTIAL: Good  CLINICAL DECISION MAKING: Evolving/moderate complexity  EVALUATION COMPLEXITY: Moderate   GOALS: Goals reviewed with patient? Yes  SHORT TERM GOALS: Target date: 2 weeks, 12/08/23  I HEP Baseline: Goal status: INITIAL   LONG TERM GOALS: Target date: 8 weeks, 01/19/23  Modified Oswestry improve Modified Oswestry Low Back Pain Disability Questionnaire: 18 / 50 = 36.0 % improve to 25% or less disability Baseline:  Goal status: INITIAL  2.  Improve lumbar flexion ROM to wnl, without increase in radicular Sx L LE:  Baseline: flexion  finger tips to tibial tubercles B with L sciatic Sx Goal status: INITIAL  3.  Normal L hip flexor strength( measured by SLR) Baseline: fasiculations, pain L sciatic with this movement Goal status: INITIAL  4.  Able to ascend steps I without pain L LE Baseline: consistent pain and difficulty Goal status: INITIAL   PLAN:  PT FREQUENCY: 1-2x/week  PT DURATION: 8 weeks  PLANNED INTERVENTIONS: 97110-Therapeutic exercises, 97530- Therapeutic activity, [id number]- Neuromuscular re-education, 97535- Self Care, [id number]- Manual therapy, and Patient/Family education.  PLAN FOR NEXT SESSION: continue with manual techniques for L posterior and lateral hips, instruct in other stretches to improve Lumbar flexibility and movement   Malick Netz L Lucius Wise, PTA 11/29/2023, 11:48 AM

## 2023-12-01 ENCOUNTER — Ambulatory Visit

## 2023-12-06 ENCOUNTER — Ambulatory Visit

## 2023-12-06 DIAGNOSIS — R252 Cramp and spasm: Secondary | ICD-10-CM

## 2023-12-06 DIAGNOSIS — G8929 Other chronic pain: Secondary | ICD-10-CM

## 2023-12-06 DIAGNOSIS — M5442 Lumbago with sciatica, left side: Secondary | ICD-10-CM | POA: Diagnosis not present

## 2023-12-06 DIAGNOSIS — M5432 Sciatica, left side: Secondary | ICD-10-CM | POA: Diagnosis not present

## 2023-12-06 DIAGNOSIS — M25652 Stiffness of left hip, not elsewhere classified: Secondary | ICD-10-CM | POA: Diagnosis not present

## 2023-12-06 NOTE — Therapy (Signed)
 OUTPATIENT PHYSICAL THERAPY THORACOLUMBAR TREATMENT   Patient Name: Cheyenne Hawkins MRN: [id number] DOB:10/07/1975, 48 y.o., female Today's Date: 12/06/2023  END OF SESSION:  PT End of Session - 12/06/23 1530     Visit Number 3    Date for Recertification  01/19/24    PT Start Time 1449    PT Stop Time 1530    PT Time Calculation (min) 41 min    Activity Tolerance Patient tolerated treatment well    Behavior During Therapy [facility] for tasks assessed/performed            Past Medical History:  Diagnosis Date   Anemia, iron  deficiency 11/19/2011   Arthritis    Atrial fibrillation (HCC)    with Graves disease    Blood transfusion without reported diagnosis    COVID-19 virus infection 08/15/2020   Depression 2012   Fatty liver 03/21/2011   GERD (gastroesophageal reflux disease)    Grave's disease 2002   s/p thyroidectomy   Heart murmur    MVP   History of chicken pox    HSV-2 (herpes simplex virus 2) infection    Hypertension    Past Surgical History:  Procedure Laterality Date   CESAREAN SECTION  2001   CESAREAN SECTION  2003   CESAREAN SECTION  2007   COLONOSCOPY  2010   [facility]    THYROIDECTOMY  2003   for graves disease   TUBAL LIGATION     Patient Active Problem List   Diagnosis Date Noted   Therapeutic drug monitoring 11/23/2023   Sciatica of left side 11/23/2023   Normal pelvic exam 10/04/2023   Lumbar radiculopathy 07/20/2023   Injury of left thumb 04/20/2023   Elbow pain, chronic, right 12/08/2022   Menorrhagia with regular cycle 11/26/2022   Attention deficit 06/08/2022   Olecranon bursitis of right elbow 04/19/2022   Essential hypertension 03/10/2022   Positive ANA (antinuclear antibody) 12/12/2021   Obesity (BMI 30.0-34.9) 12/08/2021   Perimenopause 12/08/2021   Arthralgia 12/08/2021   Screen for STD (sexually transmitted disease) 03/13/2021   Abnormal urine odor 03/13/2021   Onychomycosis 05/01/2020   Paroxysmal atrial fibrillation (HCC)  07/26/2017   Abnormal EKG 06/16/2017   Mitral valve prolapse 06/16/2017   Tachycardia 08/12/2015   Hypothyroidism 06/16/2012   Anemia, iron  deficiency 11/19/2011   Skin picking habit 11/05/2011   Preventative health care 08/02/2011   Galactorrhea 08/02/2011   HSV-2 (herpes simplex virus 2) infection 07/09/2011   Depression with anxiety 08/18/2010   IBS (irritable bowel syndrome) 08/18/2010   GERD (gastroesophageal reflux disease) 08/18/2010   Atypical chest pain 04/24/2008   Constipation 04/19/2008    PCP: MALVATHEORA Floretta Setter, NP  REFERRING PROVIDER: same  REFERRING DIAG: L sciatica  Rationale for Evaluation and Treatment: Rehabilitation  THERAPY DIAG:  Chronic left-sided low back pain with left-sided sciatica  Stiffness of left hip, not elsewhere classified  Cramp and spasm  ONSET DATE: June 2025  SUBJECTIVE:  SUBJECTIVE STATEMENT: Pt reports leaning forward caused some pain this morning, standing up this morning felt like someone was trying to drill a hole into her spine  PERTINENT HISTORY:  The pt referred by PCP to PT due to L sciatic pain  PAIN:  Are you having pain? Yes: NPRS scale: 0/10 now, 8/10 Pain location: L gluteals and hamstrings primarily occasionally entire lower leg also tingling Pain description: deep ache pain Aggravating factors: sitting, bending Relieving factors: walking helps, no meds  PRECAUTIONS: None  RED FLAGS: None   WEIGHT BEARING RESTRICTIONS: No  FALLS:  Has patient fallen in last 6 months? 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  DIAGNOSTIC FINDINGS:  na  PATIENT SURVEYS: Modified Oswestry Low Back Pain Disability Questionnaire: 18 / 50 = 36.0 % COGNITION: Overall cognitive status: Within functional limits for tasks assessed     SENSATION: WFL  MUSCLE LENGTH: Hamstrings: Right 20 deg; Left 20 deg Thomas test: Right wnl deg; Left wnl deg  POSTURE: mild L lateral pelvic shift in standing noted Noted with forward lumbar flexion that pt side bends to L in lumbar region and rotates to L thoracic region   PALPATION: Tender with palpation of L gluteals medius, minimus, TFL, L hamstring lat and midline and L piriformis Tender with PA glides L3/4 region, relief of pain with PA glide L 4./5  LUMBAR ROM:   AROM eval  Flexion Fingertips to tibial tubercle  Extension wnl  Right lateral flexion 40  Left lateral flexion 40  Right rotation   Left rotation    (Blank rows = not tested)  LOWER EXTREMITY ROM:   all LE ROM WNL  LOWER EXTREMITY MMT:  all wnl, able to heel walk and toe walk I Mild discomfort and shaking with L SLR with resistance  LUMBAR SPECIAL TESTS:  Straight leg raise test: Positive and Slump test: Positive  FUNCTIONAL TESTS:  30 seconds chair stand test  13  GAIT: Distance walked: in clinic  Assistive device utilized: None Level of assistance: Complete Independence Comments: no deficits noted  TREATMENT DATE:  12/06/23 Standing lumbar extension x 10 Standing hip extension BLE 2x10 Seated lumbar extension black TB 2x10 Prone HS curls BLE x 20 Prone hip extension BLE x 10 STM to L glutes, piriformis Supine HS stretch passive, piriformis stretch passive Bridges 2x10 Clamshells LLE   11/29/23 Prone IASTM to L glutes and piriformis, manual TPR as well Supine KTOS L hip 2x81min LTR both ways x  10 Sciatic nerve glide supine- increased pain Seated sciatic nerve glide - increased pain Standing lumbar extension x 10 Prone knee bend x 10 Prone hip extension x 10 B  11/24/23 Eval:  Side lying on L for muscle energy for lumbar flex,rot,SB restriction 4 reps Side lying R for theragun massage L gluteals and lat hip musculature  Instructed in piriformis stretch in supine  PATIENT EDUCATION:  Education details: POC, goals Person educated: Patient Education method: Explanation, Demonstration, Tactile cues, Verbal cues, and Handouts Education comprehension: verbalized understanding, returned demonstration, verbal cues required, and tactile cues required  HOME EXERCISE PROGRAM: Access Code: [email or web address] URL: [email or web address] Date: 12/06/2023 Prepared by: Sol Gaskins  Exercises - Supine Figure 4 Piriformis Stretch  - 1 x daily - 7 x weekly - 3 sets - 6 reps - 10 hold - Prone Press Up On Elbows  - 1 x daily - 7 x weekly - 3 sets - 10 reps - Prone Press Up  - 1 x daily - 7 x weekly - 3 sets - 10 reps - Standing Lumbar Extension  - 1 x daily - 7 x weekly - 3 sets - 10 reps - Supine Bridge  - 1 x daily - 7 x weekly - 3 sets - 10 reps - Clamshell  - 1 x daily - 7 x weekly - 3 sets - 10 reps  ASSESSMENT:  CLINICAL IMPRESSION:  Advanced extension exercises as this has been helping to reduce radicular pain. STM to reduce tissue restrictions. Pt with tightness in L HS and piriformis.    Patient is a 49 y.o. female  who was evaluated today by physical therapy due to persistent L sided sciatica of 5 weeks duration.  She presents with + neural tension testing for L4/5 and L5/S1.  No LE weakness noted.  Also very tender, thickened L post/lat hip musculature.  Noted restriction and movement changes with L lumbar concavity with forward bending motion.   She should benefit from PT to address her pain and movement restrictions.  Responded today well to initial manual techniques and specific stretching L hip.    OBJECTIVE IMPAIRMENTS: decreased knowledge of condition, decreased ROM, hypomobility, increased fascial restrictions, impaired flexibility, improper body mechanics, postural dysfunction, and pain.   ACTIVITY LIMITATIONS: lifting, bending, sitting, squatting, sleeping, and stairs  PARTICIPATION LIMITATIONS: cleaning, laundry, driving, community activity, and yard work  PERSONAL FACTORS: Fitness and 1-2 comorbidities: graves disease are also affecting patient's functional outcome.   REHAB POTENTIAL: Good  CLINICAL DECISION MAKING: Evolving/moderate complexity  EVALUATION COMPLEXITY: Moderate   GOALS: Goals reviewed with patient? Yes  SHORT TERM GOALS: Target date: 2 weeks, 12/08/23  I HEP Baseline: Goal status: INITIAL   LONG TERM GOALS: Target date: 8 weeks, 01/19/23  Modified Oswestry improve Modified Oswestry Low Back Pain Disability Questionnaire: 18 / 50 = 36.0 % improve to 25% or less disability Baseline:  Goal status: INITIAL  2.  Improve lumbar flexion ROM to wnl, without increase in radicular Sx L LE:  Baseline: flexion  finger tips to tibial tubercles B with L sciatic Sx Goal status: 12/06/23 met  3.  Normal L hip flexor strength( measured by SLR) Baseline: fasiculations, pain L sciatic with this movement Goal status: INITIAL  4.  Able to ascend steps I without pain L LE Baseline: consistent pain and difficulty Goal status: 12/06/23 pain in knees but no LBP or pelvic pain   PLAN:  PT FREQUENCY: 1-2x/week  PT DURATION: 8 weeks  PLANNED INTERVENTIONS: 97110-Therapeutic exercises, 97530- Therapeutic activity, [id number]- Neuromuscular re-education, 97535- Self Care, [id number]- Manual therapy, and Patient/Family education.  PLAN FOR NEXT SESSION: continue with manual techniques for L posterior and lateral hips,  instruct in other stretches to improve Lumbar flexibility and movement   Fareedah Mahler LITTIE Gaskins, PTA 12/06/2023, 3:47 PM

## 2023-12-16 ENCOUNTER — Ambulatory Visit

## 2023-12-23 ENCOUNTER — Other Ambulatory Visit

## 2024-01-04 ENCOUNTER — Ambulatory Visit

## 2024-01-10 ENCOUNTER — Other Ambulatory Visit (INDEPENDENT_AMBULATORY_CARE_PROVIDER_SITE_OTHER)

## 2024-01-10 DIAGNOSIS — B351 Tinea unguium: Secondary | ICD-10-CM

## 2024-01-10 DIAGNOSIS — E039 Hypothyroidism, unspecified: Secondary | ICD-10-CM | POA: Diagnosis not present

## 2024-01-10 LAB — HEPATIC FUNCTION PANEL
ALT: 16 U/L (ref 3–35)
AST: 17 U/L (ref 5–37)
Albumin: 4.1 g/dL (ref 3.5–5.2)
Alkaline Phosphatase: 66 U/L (ref 39–117)
Bilirubin, Direct: 0.1 mg/dL (ref 0.1–0.3)
Total Bilirubin: 0.2 mg/dL (ref 0.2–1.2)
Total Protein: 7.1 g/dL (ref 6.0–8.3)

## 2024-01-10 LAB — TSH: TSH: 1.04 u[IU]/mL (ref 0.35–5.50)

## 2024-01-11 ENCOUNTER — Ambulatory Visit: Admitting: Rehabilitation

## 2024-01-11 ENCOUNTER — Ambulatory Visit: Payer: Self-pay | Admitting: Family

## 2024-01-11 ENCOUNTER — Other Ambulatory Visit (HOSPITAL_BASED_OUTPATIENT_CLINIC_OR_DEPARTMENT_OTHER): Payer: Self-pay

## 2024-01-11 ENCOUNTER — Other Ambulatory Visit: Payer: Self-pay

## 2024-01-11 DIAGNOSIS — B351 Tinea unguium: Secondary | ICD-10-CM

## 2024-01-11 MED ORDER — TERBINAFINE HCL 250 MG PO TABS
250.0000 mg | ORAL_TABLET | Freq: Every day | ORAL | 1 refills | Status: AC
  Filled 2024-01-11: qty 30, 30d supply, fill #0

## 2024-02-24 ENCOUNTER — Ambulatory Visit: Admitting: Family
